# Patient Record
Sex: Male | Born: 1955 | Race: White | Hispanic: No | Smoking: Never smoker
Health system: Southern US, Community
[De-identification: ages and names within clinical notes are randomized; demographics above are authoritative.]

## PROBLEM LIST (undated history)

## (undated) DIAGNOSIS — Z8679 Personal history of other diseases of the circulatory system: Secondary | ICD-10-CM

## (undated) DIAGNOSIS — E785 Hyperlipidemia, unspecified: Secondary | ICD-10-CM

## (undated) DIAGNOSIS — E669 Obesity, unspecified: Secondary | ICD-10-CM

## (undated) DIAGNOSIS — M1990 Unspecified osteoarthritis, unspecified site: Secondary | ICD-10-CM

## (undated) DIAGNOSIS — I10 Essential (primary) hypertension: Secondary | ICD-10-CM

## (undated) DIAGNOSIS — I219 Acute myocardial infarction, unspecified: Secondary | ICD-10-CM

## (undated) DIAGNOSIS — I2510 Atherosclerotic heart disease of native coronary artery without angina pectoris: Secondary | ICD-10-CM

## (undated) DIAGNOSIS — E119 Type 2 diabetes mellitus without complications: Secondary | ICD-10-CM

## (undated) DIAGNOSIS — M109 Gout, unspecified: Secondary | ICD-10-CM

## (undated) DIAGNOSIS — K219 Gastro-esophageal reflux disease without esophagitis: Secondary | ICD-10-CM

## (undated) DIAGNOSIS — Z9861 Coronary angioplasty status: Secondary | ICD-10-CM

## (undated) HISTORY — DX: Type 2 diabetes mellitus without complications: E11.9

## (undated) HISTORY — PX: FOOT FRACTURE SURGERY: SHX645

## (undated) HISTORY — PX: TONSILLECTOMY: SUR1361

## (undated) HISTORY — PX: PILONIDAL CYST EXCISION: SHX744

---

## 1998-03-24 DIAGNOSIS — I2510 Atherosclerotic heart disease of native coronary artery without angina pectoris: Secondary | ICD-10-CM

## 1998-03-24 HISTORY — PX: CORONARY ANGIOPLASTY: SHX604

## 1998-03-24 HISTORY — DX: Atherosclerotic heart disease of native coronary artery without angina pectoris: I25.10

## 1998-11-05 ENCOUNTER — Inpatient Hospital Stay (HOSPITAL_COMMUNITY): Admission: EM | Admit: 1998-11-05 | Discharge: 1998-11-07 | Payer: Self-pay | Admitting: Cardiology

## 1999-05-16 ENCOUNTER — Ambulatory Visit (HOSPITAL_COMMUNITY): Admission: RE | Admit: 1999-05-16 | Discharge: 1999-05-17 | Payer: Self-pay | Admitting: *Deleted

## 2001-02-12 ENCOUNTER — Ambulatory Visit (HOSPITAL_COMMUNITY): Admission: RE | Admit: 2001-02-12 | Discharge: 2001-02-12 | Payer: Self-pay | Admitting: Cardiology

## 2001-02-12 ENCOUNTER — Encounter: Payer: Self-pay | Admitting: Cardiology

## 2002-11-16 ENCOUNTER — Ambulatory Visit (HOSPITAL_COMMUNITY): Admission: RE | Admit: 2002-11-16 | Discharge: 2002-11-16 | Payer: Self-pay | Admitting: Family Medicine

## 2002-11-16 ENCOUNTER — Encounter: Payer: Self-pay | Admitting: Family Medicine

## 2002-12-26 ENCOUNTER — Ambulatory Visit (HOSPITAL_COMMUNITY): Admission: RE | Admit: 2002-12-26 | Discharge: 2002-12-26 | Payer: Self-pay | Admitting: Internal Medicine

## 2003-07-05 ENCOUNTER — Ambulatory Visit (HOSPITAL_COMMUNITY): Admission: RE | Admit: 2003-07-05 | Discharge: 2003-07-05 | Payer: Self-pay | Admitting: *Deleted

## 2004-03-24 HISTORY — PX: CORONARY ARTERY BYPASS GRAFT: SHX141

## 2004-07-22 HISTORY — PX: OTHER SURGICAL HISTORY: SHX169

## 2004-07-30 ENCOUNTER — Inpatient Hospital Stay (HOSPITAL_COMMUNITY): Admission: EM | Admit: 2004-07-30 | Discharge: 2004-08-07 | Payer: Self-pay | Admitting: Emergency Medicine

## 2004-07-30 ENCOUNTER — Ambulatory Visit: Payer: Self-pay | Admitting: Cardiology

## 2004-08-22 ENCOUNTER — Encounter (HOSPITAL_COMMUNITY): Admission: RE | Admit: 2004-08-22 | Discharge: 2004-09-21 | Payer: Self-pay | Admitting: *Deleted

## 2004-08-23 ENCOUNTER — Ambulatory Visit: Payer: Self-pay | Admitting: *Deleted

## 2004-08-29 ENCOUNTER — Ambulatory Visit (HOSPITAL_COMMUNITY): Admission: RE | Admit: 2004-08-29 | Discharge: 2004-08-29 | Payer: Self-pay | Admitting: *Deleted

## 2004-09-30 ENCOUNTER — Encounter (HOSPITAL_COMMUNITY): Admission: RE | Admit: 2004-09-30 | Discharge: 2004-10-30 | Payer: Self-pay | Admitting: *Deleted

## 2004-10-22 ENCOUNTER — Ambulatory Visit (HOSPITAL_COMMUNITY): Admission: RE | Admit: 2004-10-22 | Discharge: 2004-10-22 | Payer: Self-pay | Admitting: General Surgery

## 2004-12-23 ENCOUNTER — Ambulatory Visit: Payer: Self-pay | Admitting: *Deleted

## 2005-07-09 ENCOUNTER — Emergency Department (HOSPITAL_COMMUNITY): Admission: EM | Admit: 2005-07-09 | Discharge: 2005-07-10 | Payer: Self-pay | Admitting: Emergency Medicine

## 2006-02-04 ENCOUNTER — Ambulatory Visit: Payer: Self-pay | Admitting: Cardiovascular Disease

## 2006-02-20 IMAGING — CR DG CHEST 1V PORT
1 series · 1 of 1 positions shown · non-contrast
Comparison: none

CLINICAL DATA: Acute coronary artery syndrome.  Coronary artery bypass grafts, postop. 
 PORTABLE CHEST - 130Z5 ? 08/02/04:
 An AP semierect portable film of the chest is made on 08/02/04 at 0970 hours is compared to a preoperative study of [DATE] at 3482 hours and shows the endotracheal tube to be approximately 3.5 cm above the carina.  Swan-Ganz catheter tip is in the right main pulmonary artery.  Nasogastric tube is in the stomach.  There are 3 large chest tubes in place.   There is bilateral basilar atelectasis but no definite pneumothorax.

[view not recorded]
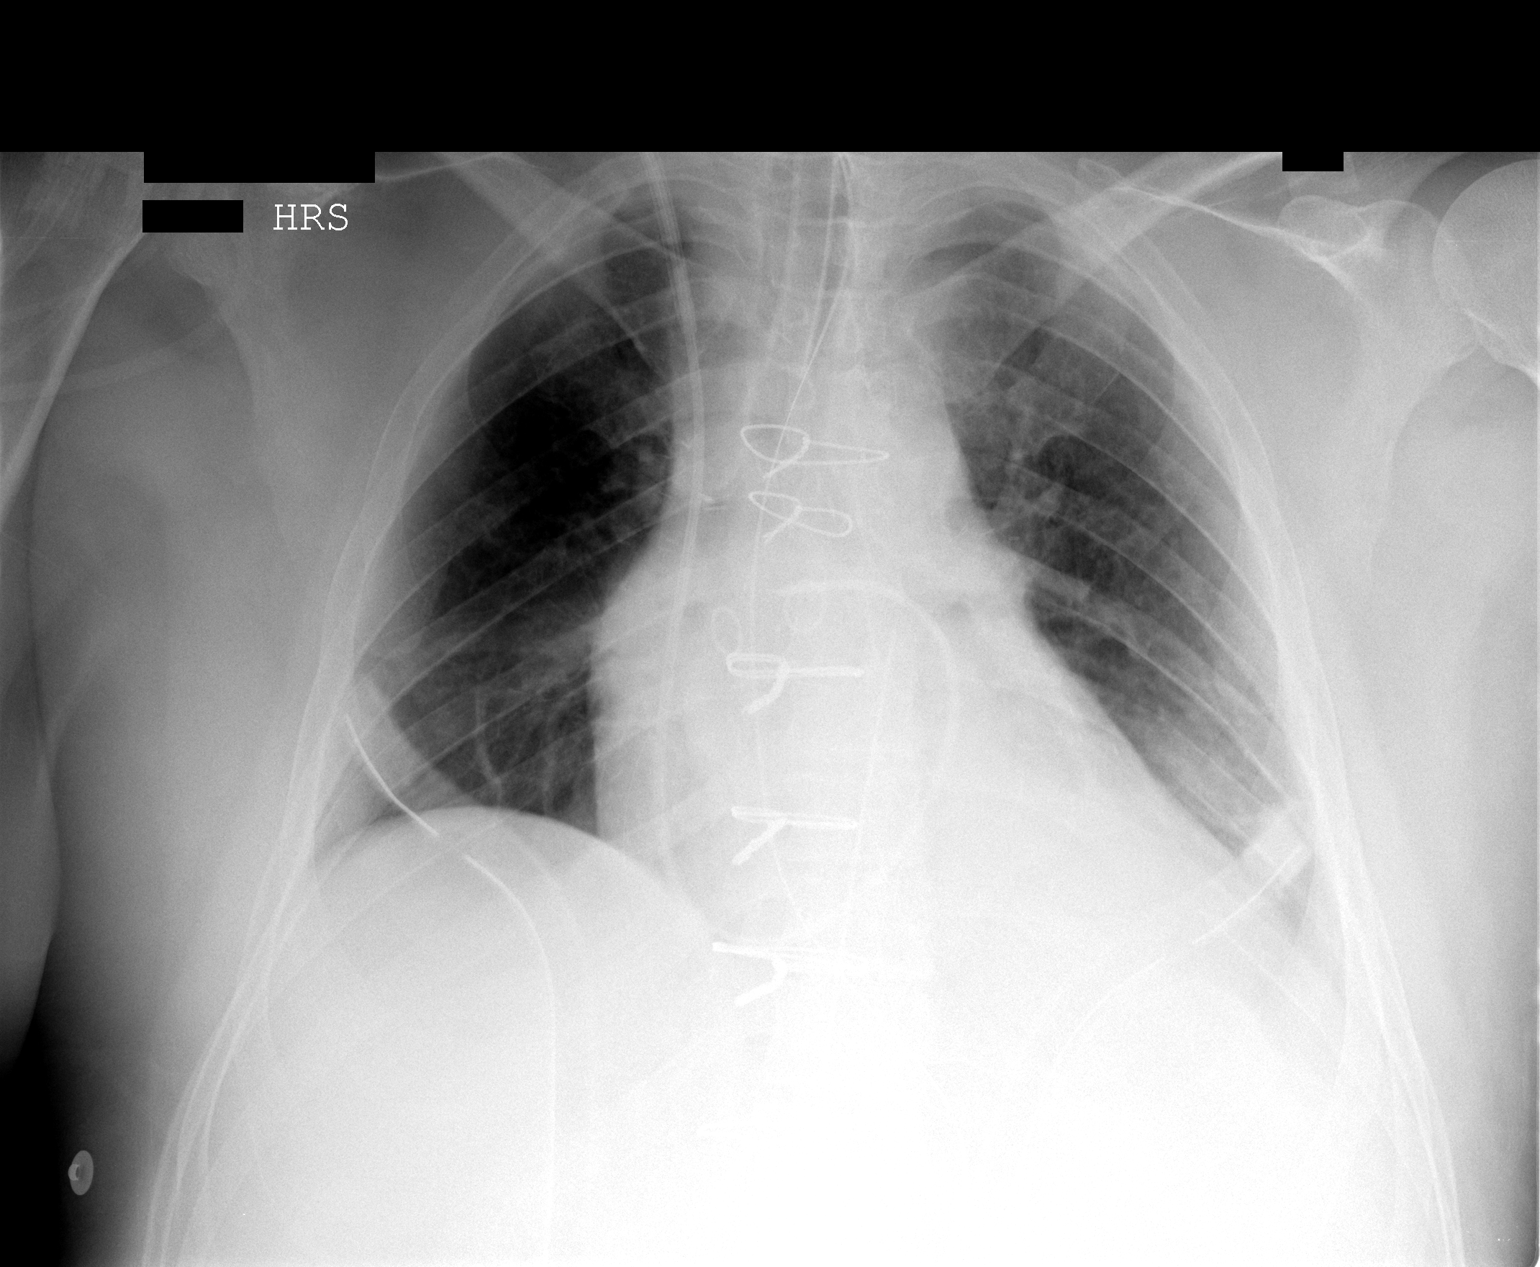

[1 of 1 positions shown; findings below may reference images not displayed]

IMPRESSION: Mild bilateral basilar atelectasis.  Good position of support tubes. No pneumothorax or edema.

## 2006-02-21 IMAGING — CR DG CHEST 1V PORT
1 series · 1 of 1 positions shown · non-contrast
Comparison: 08/02/04.

CLINICAL DATA: 48-year-old male; status post CABG 
 PORTABLE CHEST - 1 VIEW:

[view not recorded]
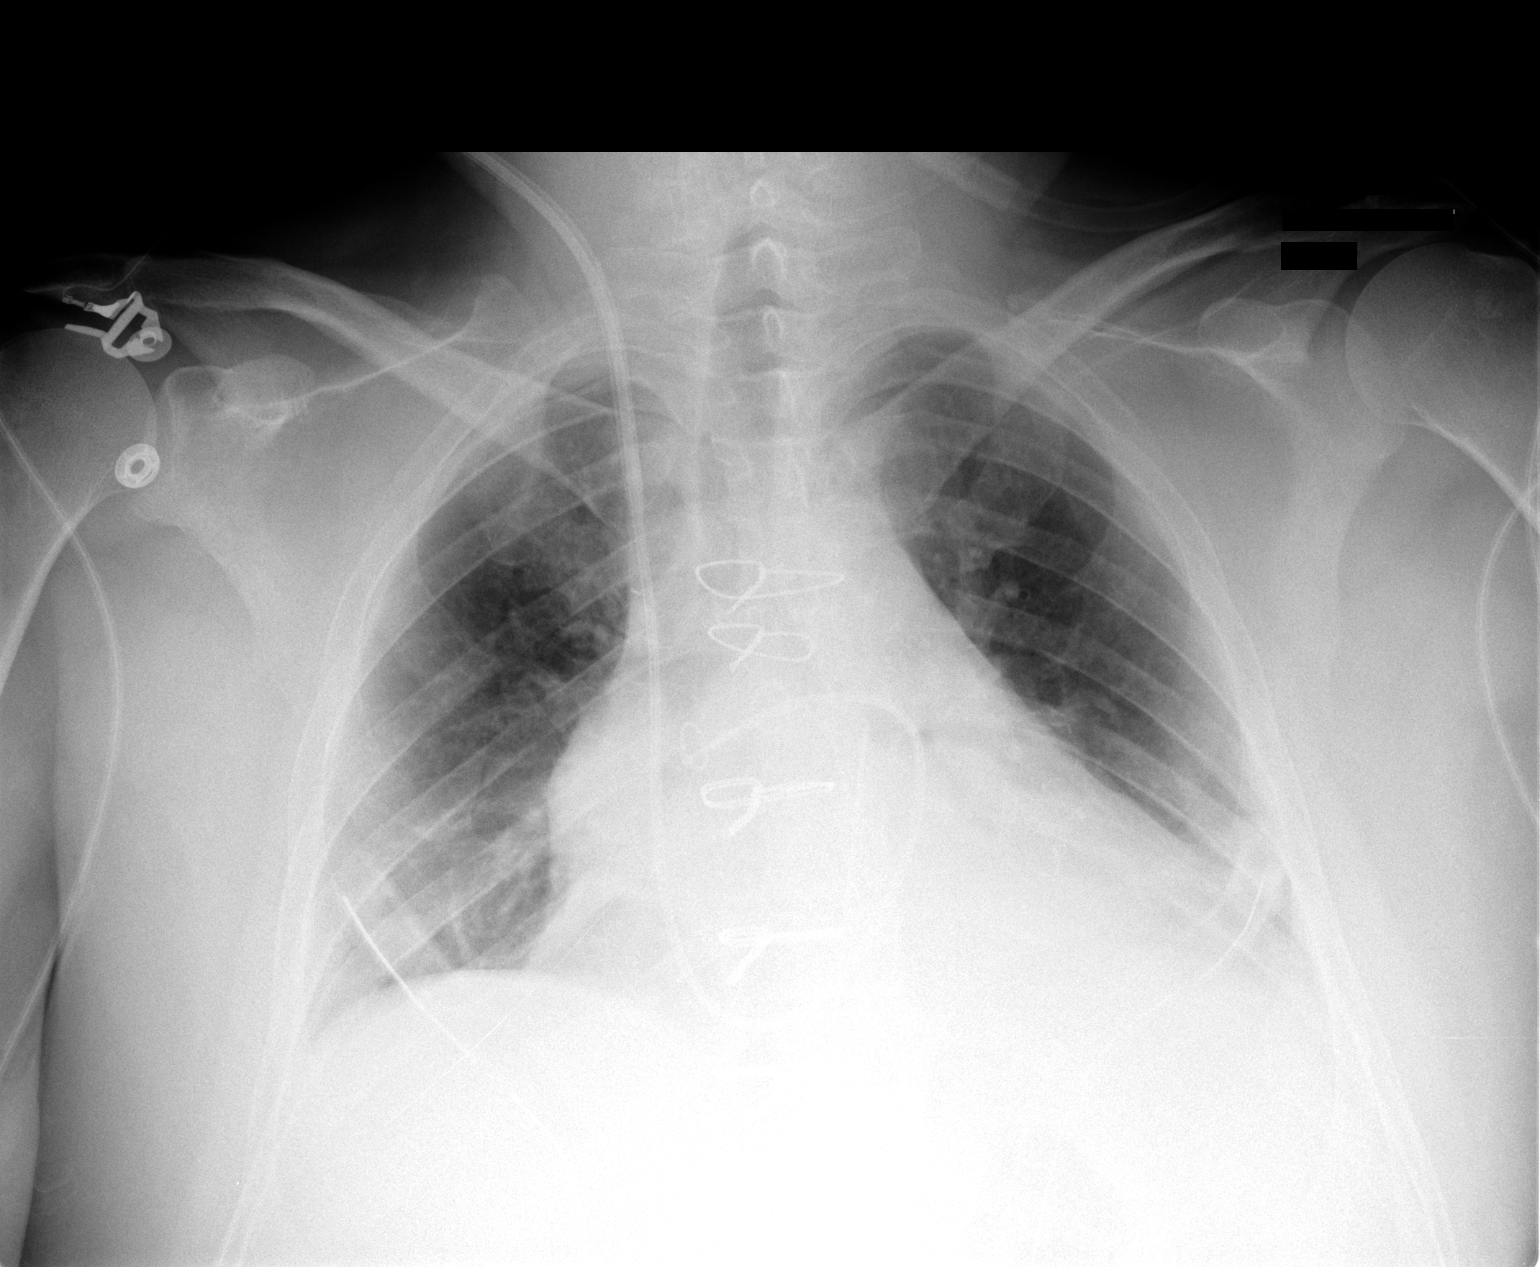

[1 of 1 positions shown; findings below may reference images not displayed]

The patient has been extubated.  Swan-Ganz catheter, mediastinal drains, and chest tubes remain.  Bibasilar atelectasis has increased.  No pneumothorax.
IMPRESSION: Extubated with increased atelectasis.

## 2007-08-05 ENCOUNTER — Ambulatory Visit: Payer: Self-pay | Admitting: Cardiovascular Disease

## 2007-08-22 ENCOUNTER — Emergency Department (HOSPITAL_COMMUNITY): Admission: EM | Admit: 2007-08-22 | Discharge: 2007-08-22 | Payer: Self-pay | Admitting: Emergency Medicine

## 2008-08-22 DIAGNOSIS — I10 Essential (primary) hypertension: Secondary | ICD-10-CM | POA: Insufficient documentation

## 2008-08-22 DIAGNOSIS — E785 Hyperlipidemia, unspecified: Secondary | ICD-10-CM | POA: Insufficient documentation

## 2008-08-23 ENCOUNTER — Ambulatory Visit: Payer: Self-pay | Admitting: Cardiovascular Disease

## 2008-09-15 ENCOUNTER — Encounter: Payer: Self-pay | Admitting: Cardiovascular Disease

## 2008-09-29 LAB — CONVERTED CEMR LAB
ALT: 21 units/L (ref 0–53)
AST: 23 units/L (ref 0–37)
LDL Cholesterol: 120 mg/dL — ABNORMAL HIGH (ref 0–99)
Total Bilirubin: 0.8 mg/dL (ref 0.3–1.2)

## 2009-04-09 ENCOUNTER — Telehealth (INDEPENDENT_AMBULATORY_CARE_PROVIDER_SITE_OTHER): Payer: Self-pay | Admitting: *Deleted

## 2009-07-09 ENCOUNTER — Encounter (INDEPENDENT_AMBULATORY_CARE_PROVIDER_SITE_OTHER): Payer: Self-pay | Admitting: *Deleted

## 2009-08-14 ENCOUNTER — Ambulatory Visit: Payer: Self-pay | Admitting: Cardiovascular Disease

## 2009-08-16 LAB — CONVERTED CEMR LAB
ALT: 30 units/L (ref 0–53)
AST: 21 units/L (ref 0–37)
Alkaline Phosphatase: 53 units/L (ref 39–117)
Bilirubin, Direct: 0.1 mg/dL (ref 0.0–0.3)
Cholesterol: 191 mg/dL (ref 0–200)
HDL: 52 mg/dL (ref >39.00)
Total Bilirubin: 0.8 mg/dL (ref 0.3–1.2)
Total CHOL/HDL Ratio: 4
Total Protein: 6.6 g/dL (ref 6.0–8.3)

## 2009-08-17 ENCOUNTER — Ambulatory Visit: Payer: Self-pay | Admitting: Cardiovascular Disease

## 2009-10-09 ENCOUNTER — Telehealth (INDEPENDENT_AMBULATORY_CARE_PROVIDER_SITE_OTHER): Payer: Self-pay | Admitting: *Deleted

## 2009-10-10 ENCOUNTER — Encounter: Payer: Self-pay | Admitting: Cardiology

## 2009-10-10 ENCOUNTER — Encounter (HOSPITAL_COMMUNITY): Admission: RE | Admit: 2009-10-10 | Discharge: 2009-12-11 | Payer: Self-pay | Admitting: Cardiovascular Disease

## 2009-10-10 ENCOUNTER — Ambulatory Visit: Payer: Self-pay | Admitting: Cardiology

## 2009-10-10 ENCOUNTER — Ambulatory Visit: Payer: Self-pay

## 2009-10-10 ENCOUNTER — Ambulatory Visit: Payer: Self-pay | Admitting: Cardiovascular Disease

## 2009-10-18 LAB — CONVERTED CEMR LAB
ALT: 22 units/L (ref 0–53)
AST: 21 units/L (ref 0–37)
Bilirubin, Direct: 0.2 mg/dL (ref 0.0–0.3)
Total Bilirubin: 1 mg/dL (ref 0.3–1.2)
Total Protein: 6.7 g/dL (ref 6.0–8.3)
Triglycerides: 129 mg/dL (ref 0.0–149.0)
VLDL: 25.8 mg/dL (ref 0.0–40.0)

## 2010-04-14 ENCOUNTER — Encounter: Payer: Self-pay | Admitting: *Deleted

## 2010-04-23 NOTE — Assessment & Plan Note (Signed)
Summary: Cardiology Nuclear Study  Nuclear Med Background Indications for Stress Test: Evaluation for Ischemia, Graft Patency, Stent Patency, PTCA Patency   History: Angioplasty, CABG, Echo, Heart Catheterization, Myocardial Infarction, Myocardial Perfusion Study, Stents  History Comments: 8/00 Lateral Wall MI > Stent: CFX '01 Angioplasty: RCA > Stent: RCA 10/01 Echo: EF=63% 11/02 MPS 5/06 AWMI > Heart Cath: EF= 65% > CABG x4  Symptoms: DOE, Palpitations    Nuclear Pre-Procedure Cardiac Risk Factors: Hypertension, Lipids Caffeine/Decaff Intake: None NPO After: 9:00 PM Lungs: clear IV 0.9% NS with Angio Cath: 20g     IV Site: (R) AC IV Started by: Irean Hong RN Chest Size (in) 48     Height (in): 69 Weight (lb): 243 BMI: 36.01  Nuclear Med Study 1 or 2 day study:  1 day     Stress Test Type:  Stress Reading MD:  Willa Rough, MD     Referring MD:  P.Nishan Resting Radionuclide:  Technetium 74m Tetrofosmin     Resting Radionuclide Dose:  11.0 mCi  Stress Radionuclide:  Technetium 23m Tetrofosmin     Stress Radionuclide Dose:  33.0 mCi   Stress Protocol Exercise Time (min):  10:00 min     Max HR:  155 bpm     Predicted Max HR:  166 bpm  Max Systolic BP: 196 mm Hg     Percent Max HR:  93.37 %     METS: 11.7 Rate Pressure Product:  16109    Stress Test Technologist:  Milana Na EMT-P     Nuclear Technologist:  Harlow Asa CNMT  Rest Procedure  Myocardial perfusion imaging was performed at rest 45 minutes following the intravenous administration of Myoview Technetium 68m Tetrofosmin.  Stress Procedure  The patient exercised for 10;00. The patient stopped due to fatigueand denied any chest pain.  There were no significant ST-T wave changes and occ pvcs/V.Bigemeny.  Myoview was injected at peak exercise and myocardial perfusion imaging was performed after a brief delay.  QPS Raw Data Images:  Normal; no motion artifact; normal heart/lung ratio. Stress Images:   There is normal uptake in all areas. Rest Images:  Normal homogeneous uptake in all areas of the myocardium. Subtraction (SDS):  No evidence of ischemia. Transient Ischemic Dilatation:  1.02  (Normal <1.22)  Lung/Heart Ratio:  .38  (Normal <0.45)  Quantitative Gated Spect Images QGS EDV:  112 ml QGS ESV:  42 ml QGS EF:  63 % QGS cine images:  Normal motion  Findings Normal nuclear study      Overall Impression  Exercise Capacity: Good exercise capacity. BP Response: Normal blood pressure response. Clinical Symptoms: No chest pain ECG Impression: No significant ST segment change suggestive of ischemia. Overall Impression: Normal stress nuclear study.  Appended Document: Cardiology Nuclear Study normal nuclear study  Appended Document: Cardiology Nuclear Study LMTCB./CY  Appended Document: Cardiology Nuclear Study pt aware./cy

## 2010-04-23 NOTE — Letter (Signed)
Summary: Appointment - Reminder 2  [facility], Main Office  [address]   [place], [place] [address]   Phone: [phone]  Fax: [phone]     July 09, 2009 MRN: [id number]   HARVEST STANCO [address] [place], [place]  [address]   Dear Mr. CRISCO,  Our records indicate that it is time to schedule a follow-up appointment with Dr. Eden Emms. It is very important that we reach you to schedule this appointment. We look forward to participating in your health care needs. Please contact us at the number listed above at your earliest convenience to schedule your appointment.  If you are unable to make an appointment at this time, give [place] a call so we can update our records.     Sincerely,   Migdalia Dk [facility] Scheduling Team

## 2010-04-23 NOTE — Progress Notes (Signed)
  Medications Added SIMVASTATIN 40 MG TABS (SIMVASTATIN) Take one tablet by mouth daily at bedtime       Phone Note Outgoing Call   Call placed by: Deliah Goody, RN,  April 09, 2009 9:33 AM Summary of Call: pt is currently taking vytorin and we received paperwork from the pharm requesting cheaper alternative. call placed to pt to discuss zocor and zetia in separate scripts. Left message to call back Deliah Goody, RN  April 09, 2009 9:35 AM   Follow-up for Phone Call        spoke with pt, he would like to try simvastatin 40mg  alone to see if that works for his numbers. he will have labs checked in 3 months Deliah Goody, RN  April 09, 2009 4:57 PM\par    New/Updated Medications: SIMVASTATIN 40 MG TABS (SIMVASTATIN) Take one tablet by mouth daily at bedtime Prescriptions: SIMVASTATIN 40 MG TABS (SIMVASTATIN) Take one tablet by mouth daily at bedtime  #30 x 12   Entered by:   Deliah Goody, RN   Authorized by:   Ferman Hamming, MD, [facility]   Signed by:   Deliah Goody, RN on 04/09/2009   Method used:   Electronically to        [phone] (retail)       [address]       [place], [place]  [address]       Ph: [phone] or [phone]       Fax: [phone]   RxID:   [id number]

## 2010-04-23 NOTE — Assessment & Plan Note (Signed)
Summary: F1Y/DM  Medications Added CRESTOR 20 MG TABS (ROSUVASTATIN CALCIUM) Take one tablet by mouth daily. CRESTOR 40 MG TABS (ROSUVASTATIN CALCIUM) Take one tablet by mouth daily.      Allergies Added: NKDA  History of Present Illness: [name] is seen  today for  followup  coronary artery disease.CABG  in 2006 by Dr. Orson Aloe.  This included a LIMA to the LAD a free RIMA to the acute marginal branch, vein graft to diagonal and vein graft to the RCA.  Hie has  normal LV  function.  He has been having some exertional dyspnea.  I suspect this is from his weight and deconditioning.  A lot of his symptoms pre CABG included dyspnea.  He has not had a ETT in 5 years.  His LDL was elevated at 120.  He had been on vytorin but his insurance wouldn't coveer it.  He is on Zocor and will need to be switched to crestor.  He has some tightness with his dsypnea.  Denies associated palpitations, diaphoresis or edema.  Compliant with meds  Current Problems (verified): 1)  Hyperlipidemia  (ICD-272.4) 2)  Hypertension  (ICD-401.9) 3)  Cad  (ICD-414.00)  Current Medications (verified): 1)  Aspirin 81 Mg Tbec (Aspirin) .... Take One Tablet By Mouth Daily 2)  Crestor 40 Mg Tabs (Rosuvastatin Calcium) .... Take One Tablet By Mouth Daily.  Allergies (verified): No Known Drug Allergies  Past History:  Past Medical History: Last updated: 08/22/2008 Current Problems:  HYPERLIPIDEMIA (ICD-272.4) HYPERTENSION (ICD-401.9) CAD (ICD-414.00) CABG 2006  Past Surgical History: Last updated: 08/22/2008  CABG 2006:   LIMA to the LAD, free RIMA to the OM, vein  graft to the diagonal, vein graft to the RCA.   Pilonidal cyst excision Tonsillectomy  Family History: Last updated: 08/22/2008 The family history is significant for cervical cancer in  the patient's mother who died at age 28 and lung cancer in the patient's  father who died at age 48.  On the patient's mother side of the family there  is a history  of cardiac disease.  The patient's reports two uncles with  bypass surgery at mid age.  Social History: Last updated: 08/23/2008 The patient works at [employer].  He denies any history of  tobacco or alcohol use.  He does not exercise regularly. Divorced x2 with 3 older children Working overtime at [employer] now.  Review of Systems       Denies fever, malais, weight loss, blurry vision, decreased visual acuity, cough, sputum, SOB, hemoptysis, pleuritic pain, palpitaitons, heartburn, abdominal pain, melena, lower extremity edema, claudication, or rash.   Vital Signs:  Patient profile:   55 year old male Height:      69 inches Weight:      250 pounds BMI:     37.05 Pulse rate:   72 / minute Resp:     16 per minute BP sitting:   152 / 87  (right arm)  Vitals Entered By: Marrion Coy, CNA (Aug 17, 2009 9:08 AM)  Physical Exam  General:  Affect appropriate Healthy:  appears stated age HEENT: normal Neck supple with no adenopathy JVP normal no bruits no thyromegaly Lungs clear with no wheezing and good diaphragmatic motion Heart:  S1/S2 no murmur,rub, gallop or click PMI normal Abdomen: benighn, BS positve, no tenderness, no AAA no bruit.  No HSM or HJR Distal pulses intact with no bruits No edema Neuro non-focal Skin warm and dry    Impression & Recommendations:  Problem #  1:  CAD (ICD-414.00) Dyspnea R/O anginal equivalent.  Stress myovue His updated medication list for this problem includes:    Aspirin 81 Mg Tbec (Aspirin) .Marland Kitchen... Take one tablet by mouth daily  Orders: Nuclear Stress Test (Nuc Stress Test)  Problem # 2:  HYPERTENSION (ICD-401.9) Well controlled His updated medication list for this problem includes:    Aspirin 81 Mg Tbec (Aspirin) .Marland Kitchen... Take one tablet by mouth daily  Problem # 3:  HYPERLIPIDEMIA (ICD-272.4) Target LDL 70 or less Change to Crestor His updated medication list for this problem includes:    Crestor 40 Mg Tabs (Rosuvastatin calcium)  .Marland Kitchen... Take one tablet by mouth daily.  Patient Instructions: 1)  Your physician recommends that you schedule a follow-up appointment in: ONEYEAR 2)  Your physician has requested that you have an exercise stress myoview.  For further information please visit [email or web address]  Please follow instruction sheet, as given. 3)  Your physician recommends that you return for lab work in: 2 MONTHS 4)  Your physician has recommended you make the following change in your medication: CRESTOR 40MG  ONCE DAILY Prescriptions: CRESTOR 40 MG TABS (ROSUVASTATIN CALCIUM) Take one tablet by mouth daily.  #30 x 12   Entered by:   Deliah Goody, RN   Authorized by:   Colon Branch, MD, [facility]   Signed by:   Deliah Goody, RN on 08/17/2009   Method used:   [facility]        [facility] [phone][facility] (retail)       [address]       [place], [place]  [address]       Ph: [phone] or [phone]       Fax: [phone]   RxID:   [id number] CRESTOR 20 MG TABS (ROSUVASTATIN CALCIUM) Take one tablet by mouth daily.  #30 x 12   Entered by:   Deliah Goody, RN   Authorized by:   Colon Branch, MD, [facility]   Signed by:   Deliah Goody, RN on 08/17/2009   Method used:   Electronically to        [facility] [phone]* (retail)       [address]       [place], [place]  [address]       Ph: [phone] or [phone]       Fax: [phone]   RxID:   [id number]    EKG Report  Procedure date:  08/17/2009  Findings:      NSR  Nonspecific ST/T wave changes

## 2010-04-23 NOTE — Progress Notes (Signed)
Summary: Nuclear Pre-Procedure  Phone Note Outgoing Call Call back at [facility] Phone [phone]   Call placed by: Stanton Kidney, EMT-P,  October 09, 2009 11:06 AM Action Taken: Phone Call Completed Summary of Call: Left message with information on Myoview Information Sheet (see scanned document for details).     Nuclear Med Background Indications for Stress Test: Evaluation for Ischemia, Graft Patency, Stent Patency, PTCA Patency   History: Angioplasty, CABG, Echo, Heart Catheterization, Myocardial Infarction, Myocardial Perfusion Study, Stents  History Comments: 8/00 Lateral Wall MI > Stent: CFX '01 Angioplasty: RCA > Stent: RCA 10/01 Echo: EF=63% 11/02 MPS 5/06 AWMI > Heart Cath: EF= 65% > CABG x4  Symptoms: DOE    Nuclear Pre-Procedure Cardiac Risk Factors: Hypertension, Lipids Height (in): 69

## 2010-05-13 ENCOUNTER — Other Ambulatory Visit (HOSPITAL_COMMUNITY): Payer: Self-pay | Admitting: Family Medicine

## 2010-05-13 DIAGNOSIS — N50819 Testicular pain, unspecified: Secondary | ICD-10-CM

## 2010-05-15 ENCOUNTER — Ambulatory Visit (HOSPITAL_COMMUNITY)
Admission: RE | Admit: 2010-05-15 | Discharge: 2010-05-15 | Disposition: A | Discharge status: Home / Self Care | Payer: 59 | Source: Ambulatory Visit | Attending: Family Medicine | Admitting: Family Medicine

## 2010-05-15 ENCOUNTER — Other Ambulatory Visit (HOSPITAL_COMMUNITY): Payer: Self-pay | Admitting: Family Medicine

## 2010-05-15 DIAGNOSIS — N50819 Testicular pain, unspecified: Secondary | ICD-10-CM

## 2010-05-15 DIAGNOSIS — IMO0002 Reserved for concepts with insufficient information to code with codable children: Secondary | ICD-10-CM

## 2010-05-15 DIAGNOSIS — N509 Disorder of male genital organs, unspecified: Secondary | ICD-10-CM | POA: Insufficient documentation

## 2010-05-15 DIAGNOSIS — N508 Other specified disorders of male genital organs: Secondary | ICD-10-CM | POA: Insufficient documentation

## 2010-08-06 NOTE — Assessment & Plan Note (Signed)
[name]                       [facility] CARDIOLOGY OFFICE NOTE   Nathan, [name]                     MRN:          [id number]  DATE:08/05/2007                            DOB:          [birth year]    [name] is seen today in followup.  I have not seen him before.  He  has been seen by Dr. Dorethea Clan.  He has a history of bypass surgery in  2006.  He had previously had an anterior wall MI in May of 2006.  He  subsequently had CABG with LIMA to the LAD, free RIMA to the OM, vein  graft to the diagonal, vein graft to the RCA.   He is a nondiabetic.  He is not having chest pain.  He does not need a  followup Myoview.   The patient unfortunately has not been taking his beta blocker and  cholesterol medicine.  His cholesterol in the past has not been that  bad, on therapy it has been 60-71.  He just got out of the habit of  taking his pills.  I explained to him that this would hasten his graft  failure.  He was on Vytorin 10/20 without a problem.  [facility] [place]  He said he would  take.  I told him to get his liver checked in 8-12 weeks with Dr.  Nobie Putnam.   Otherwise, the patient is doing well.  He continues to work at  [employer].  He walks but is otherwise sedentary.  He is not having chest  pain, PND, orthopnea.  There is no lower extremity edema.   He had previously been on beta blocker, but I do not think this is  necessary now.  He was cautioned to watch his blood pressure at home and  make sure the numbers stay below 150/90.   REVIEW OF SYSTEMS:  Otherwise negative.  He is looking forward to his  son's wedding in 2 weeks.   MEDICATIONS:  1. A baby aspirin.  2. Vytorin 10/20.   PHYSICAL EXAMINATION:  GENERAL:  An overweight white male in no  distress.  VITAL SIGNS:  Weight is 243, blood pressure is 130/80, pulse 79 and  regular, respiratory rate 14, afebrile.  HEENT:   Unremarkable.  NECK:  Carotids are without bruit.  No lymphadenopathy, thyromegaly, JVP  elevation.  LUNGS:  Clear with diaphragmatic motion.  No wheezing.  CARDIOVASCULAR:  S1-S2 with normal heart sounds.  PMI normal.  Status  post sternotomy.  ABDOMEN:  Protuberant.  Bowel sounds positive.  No AAA, no tenderness.  No hepatosplenomegaly or hepatojugular reflux.  No bruits.  EXTREMITIES:  Distal pulses intact.  No edema.  NEUROLOGIC:  Nonfocal.  SKIN:  Warm and dry.  MUSCULOSKELETAL:  No muscular weakness.   IMPRESSION:  1. Coronary disease, previous coronary artery bypass graft.  Continue      baby aspirin.  No need for beta blocker at this time.  2. Hypercholesterolemia in the setting of bypass graft.  Stopped      Vytorin 10/20.  3. Borderline hypertension.  Increase exercise program, decrease salt,      monitor blood pressure at home.  Follow up with Dr. Nobie Putnam.   We will see him back in a year.  The patient is asymptomatic.  His  bypass grafts are not that old, and he is nondiabetic, so I do not think  he needs a followup stress test.     Nathan Arista C. Eden Emms, MD, [facility]  Electronically Signed    PCN/MedQ  DD: 08/05/2007  DT: 08/05/2007  Job #: [id number]

## 2010-08-09 NOTE — Discharge Summary (Signed)
[name], [name] NO.:  [id number]   MEDICAL RECORD NO.:  [id number]          PATIENT TYPE:  INP   LOCATION:  2029                         FACILITY:  [facility]   PHYSICIAN:  Salvatore Decent. Dorris Fetch, M.D.DATE OF BIRTH:  14-Jan-1956   DATE OF ADMISSION:  07/30/2004  DATE OF DISCHARGE:                                 DISCHARGE SUMMARY   PRIMARY DIAGNOSIS:  Coronary artery disease.   IN-HOSPITAL DIAGNOSIS:  Acute blood loss anemia.   SECONDARY DIAGNOSES:  1.  Coronary artery disease, status post myocardial infarction in 2000 and      status post percutaneous transluminal coronary angiography of circumflex      previously, most recently in 2001.  2.  Hypertension.  3.  Hypercholesterolemia.   ALLERGIES:  No known drug allergies.   IN-HOSPITAL OPERATIONS AND PROCEDURES:  1.  Coronary artery bypass grafting x 4 with the left internal mammary      artery to the left anterior descending artery, a free right internal      mammary artery to first obtuse marginal, saphenous vein graft to first      diagonal and saphenous vein graft to distal right coronary artery.  Note      that endoscopic vein harvest from right thigh was done.  2.  Cardiac catheterization.  The left heart catheter, left      ventriculography, coronary angiography and __________ closure of the      right common femoral arteriotomy site.   HISTORY OF PRESENT ILLNESS:  Mr. Sivils is a 55 year old gentleman with a  history of coronary artery disease, status post PTCA and stenting x 2 and  previous MI back in 2000.  He had been well until about three weeks ago when  he developed exertional chest pain with moderate exertion.  It was  accompanied by shortness of breath.  No nausea, vomiting or radiation.  He  describes this as tightness or fullness in the chest.  He denied any rest or  nocturnal symptoms.  He initially delayed seeing treatment because he had a  doctor's appointment scheduled for next week,  but finally became concerned.  This did not resolve and he went to see his doctor, Dr. Phillips Odor.  He was  subsequently referred to Vida Roller, M.D., who recommended cardiac  catheterization.   HOSPITAL COURSE:  He had cardiac catheterization on Aug 01, 2004, which  revealed severe three-vessel coronary artery disease with preserved left  ventricular function.  He had an ejection fraction of about 65% without  regional wall motion abnormality.  The left anterior descending had a 50%  stenosis before the takeoff of the second diagonal and distal 90% stenosis.  The circumflex stent in the mid portion was widely patent, although there  was a 90% stenosis of the AV groove circumflex just before the takeoff of  the marginal.  RCA showed a 90% stenosis distally.  The balloon angioplasty  segment of the vessel was widely patent.  There was a question of mild  aortic stenosis on pullback of the catheter, but the valve appeared to move  well.  We  were consulted following catheterization.  The patient was seen  and evaluated by Dr. Dorris Fetch.  Dr. Dorris Fetch discussed with the  patient regarding coronary artery bypass grafting for these blockages.  He  discussed with the patient the risks and benefits of this procedure.  The  patient wished to proceed.  He was scheduled for surgery on Aug 02, 2004.  Preoperatively he underwent bilateral carotid duplex ultrasound which showed  no significant stenosis noted bilaterally.  He also underwent bilateral ABIs  which were within normal limits.   The patient was taken to the operating room on Aug 02, 2004, where he  underwent coronary artery bypass grafting x 4 with a left internal mammary  artery to the left anterior descending artery, a free right internal mammary  artery to first obtuse marginal, saphenous vein graft to the first diagonal  and saphenous vein graft to distal right coronary artery.  Note that  endoscopic vein harvesting from the right  thigh was used.  Also note that  targets were diffusely disease.  The patient tolerated this procedure well  and was transferred up to the intensive care unit in stable condition.  Postoperatively, the patient seems to be hemodynamically stable.  He was  extubated in the late evening, early morning following surgery.  On  postoperative day #1, the patient was seen to have an acute blood loss  anemia.  His H&H was 8.8 and 25.  He was in normal sinus rhythm.  Chest  tubes and lines were discontinued on postoperative day #1.  The patient was  transferred out to 2000 on postoperative day #1.  On postoperative day #2,  the patient seemed to be progressively well.  He was out of bed and  ambulating.  He remained on 2 L of O2, saturating 92%.  He remained in sinus  rhythm, apaced.  His H&H decreased to 7.9 and 22.3.  At the time, this was  monitored.  The patient was started on iron and folic acid.  He was seen to  be asymptomatic due to his low H&H.  Later on postoperative day #1, his  external pacer was deemed to be discontinued.  He remained in normal sinus  rhythm in the 80s.  On postoperative day #3, the patient continued to  progress well.  His H&H continued to drop to 7.1 and 20.2, although he  remained asymptomatic with blood pressure stable.  His heart remained in  normal sinus rhythm in the 80s.  He did remain on 2 L nasal cannula  saturating at 94%.  He was seen to be slight volume overloaded.  His  diuretics were continued.  Due to the patient being asymptomatic,  transfusion was held.  The pacing wires were discontinued on postoperative  day #3.   The patient was discharged home on postoperative day #4 in stable condition.  He was able to come off of the oxygen and saturating 91-92% on room air.  He  remained in normal sinus rhythm and hemodynamically stable.  A followup appointment was scheduled with Dr. Dorris Fetch for August 30, 2004, at 11:45  a.m.  The patient will see Dr. Samule Ohm  prior.  His office will scheduled an  appointment for him.  The patient will have a PA and lateral chest x-ray at  this appointment and will bring this x-ray with him to Dr. Sunday Corn  appointment.  Mr. Watrous received instructions on diet, activity level and  incisional care.  He was told no driving until released  to do so.  No heavy  lifting over 10 pounds.  He was told he was allowed to shower and wash his  incisions using soap and water.  He is to contact the office if he develops  any drainage or opening of any of his incision sites.  He was also informed  not to use any lotions or powders on his incisions.  The patient  acknowledged understanding.  He was educated on a low-fat, low-salt diet.  He was told to ambulate three to four times per day and to continue his  breathing exercises.  The patient again acknowledges understanding.   DISCHARGE MEDICATIONS:  1.  Aspirin 325 mg p.o. daily.  2.  Toprol XL 25 mg p.o. daily.  3.  Vytorin 10/20 mg p.o. q.h.s.  4.  Lasix 40 mg p.o. daily x 5 days.  5.  Potassium chloride 20 mEq p.o. daily x 5 days.  6.  Niferex 150 mg p.o. daily.  7.  Folic acid 1 mg p.o. daily.  8.  Tylox one to two tablets p.o. q.4-6h. p.r.n. pain.      KMD/MEDQ  D:  08/05/2004  T:  08/05/2004  Job:  [id number]

## 2010-08-09 NOTE — Cardiovascular Report (Signed)
NAMEDAK, SZUMSKI NO.:  [id number]   MEDICAL RECORD NO.:  [id number]          PATIENT TYPE:  INP   LOCATION:  3715                         FACILITY:  [facility]   PHYSICIAN:  Salvadore Farber, M.D. LHCDATE OF BIRTH:  1955-07-14   DATE OF PROCEDURE:  08/01/2004  DATE OF DISCHARGE:                              CARDIAC CATHETERIZATION   PROCEDURE:  1.  Left heart catheter.  2.  Left ventriculography.  3.  Coronary angiography.  4.  Starclose closure of the right common femoral arteriotomy site.   INDICATIONS:  Mr. Velaquez is a 56 year old gentleman with long-standing  coronary disease.  He is status post balloon angioplasty of the RCA in 2001  and stenting of the obtuse marginal in August 2000.  He now presents with  three weeks of unstable angina.  He is admitted to the hospital and ruled  out for myocardial infarction.  He was referred for diagnostic angiography.   PROCEDURAL TECHNIQUE:  Informed consent was obtained.  After 1% lidocaine  local anesthesia a 5-French sheath as placed within the right common femoral  artery.  Diagnostic angiography was then performed using JL4 catheter for  the circumflex, AL2 catheter for the LAD, JR4 for the right.  Left heart  catheterization and ventriculography was then performed using pigtail  catheter.  The arteriotomy site was then closed using a Starclose device.  Complete hemostasis was obtained.  He was transferred to the holding room in  stable condition.   COMPLICATIONS:  None.   FINDINGS:  1.  LV:  129/7/15.  EF approximately 65% without regional wall motion      abnormality.  2.  No mitral regurgitation.  3.  There is approximately 10 mmHg peak to peak aortic valvular gradient on      pullback.  However, the valve appears to move very well calling this      finding into question.  4.  Left main:  Short vessel which is angiographically normal.  5.  LAD:  Large vessel wrapping the apex of the heart and giving  rise to two      moderate sized diagonals.  The mid LAD has a 50% stenosis before the      takeoff of the second diagonal.  Just after the takeoff of this second      diagonal there is a long segment of disease with up to 90% stenosis.      The second diagonal has an ostial 40% lesion.  6.  Circumflex:  Moderate sized vessel giving rise to a single large obtuse      marginal.  The stent in the mid portion of this marginal was widely      patent.  However, there is a new 90% stenosis of the AV groove      circumflex just before the takeoff of the marginal.  There is a 30%      stenosis of the first obtuse marginal proximal to the stent.  7.  RCA:  Relatively small, though dominant vessel.  There is a 90% stenosis      distally.  The  balloon angioplasty site in the mid vessel is widely      patent.   IMPRESSION/RECOMMENDATION:  Patient has severe three vessel coronary disease  with preserved left ventricular systolic function.  Due to the length of the  left anterior descending lesion I think coronary artery bypass grafting  should be considered.  Will consult CVTS.      WED/MEDQ  D:  08/01/2004  T:  08/01/2004  Job:  [id number]   cc:   Vida Roller, M.D.  Fax: [phone]   Corrie Mckusick, M.D.  Fax: [phone]

## 2010-08-09 NOTE — Procedures (Signed)
[facility]  Patient:    [name], [name] Visit Number: [id number] MRN: [id number]          Service Type: OUT Location: RAD Attending Physician:  Cain Sieve Dictated by:   Delton See, P.A. Proc. Date: 02/12/01 Admit Date:  02/12/2001   CC:         Karleen Hampshire, M.D.   Stress Test  PROCEDURE:  Exercise Cardiolite study.  BRIEF HISTORY:  Ms. Gil is a pleasant 55 year old male with a history of coronary artery disease.  He had a PCI of the RCA performed May 16, 1999. There was residual mild-to-moderate disease.  He has a history of anxiety, history of a lateral wall MI in 2000, at which time he had a stent to the first obtuse marginal.  He had a 2-D echo in October of 2001 revealing an EF of 63%.  He had a Cardiolite in October of 2001 that was negative.  Prior to the study today, the patient reports no recent chest pain.  His baseline EKG shows sinus rhythm, rate 69, without ischemic changes.  Resting blood pressure was 140/80.  Pulse was 69.  Target heart rate was 147.  DESCRIPTION OF PROCEDURE:  The patient exercised for a total of 10 minutes and 57 seconds, reaching a maximum heart rate of 160 beats per minute.  He was injected at 7 minutes and 25 seconds into the study, at which time his heart rate was 146.  He did have an elevated blood pressure response to exercise with pressure of 200/100.  There were inferolateral ST changes at peak exercise.  This resolved quickly in recovery.  The patient had no symptoms of chest pain.  He did have bigeminy in recovery, although this was asymptomatic. The patients blood pressure was 150/80 at the time of conclusion of the study.  The final images are pending at time of this dictation. Dictated by:   Delton See, P.A. Attending Physician:  Cain Sieve DD:  02/12/01 TD:  02/13/01 Job: [id number]

## 2010-08-09 NOTE — Op Note (Signed)
NAMEAUDLEY, HINOJOS                        ACCOUNT NO.:  [id number]   MEDICAL RECORD NO.:  [id number]                   PATIENT TYPE:  AMB   LOCATION:  DAY                                  FACILITY:  [facility]   PHYSICIAN:  Lionel December, M.D.                 DATE OF BIRTH:  1956-01-07   DATE OF PROCEDURE:  12/26/2002  DATE OF DISCHARGE:                                 OPERATIVE REPORT   PROCEDURE:  Esophagogastroduodenoscopy.   HISTORY OF PRESENT ILLNESS:  Monte is a 55 year old Caucasian male who  recently presented with epigastric pain.  His symptoms have improved with  Aciphex and Carafate which has been discontinued.  He also has symptoms of  GERD for a couple of years.  We therefore recommended EGD.  At that time, he  was also complaining of dysphagia but he says this has completely resolved.  The procedure is reviewed with the patient.  Informed consent was obtained.   PREOPERATIVE MEDICATIONS:  Cetacaine spray for pharyngeal topical  anesthesia, Demerol 50 mg IV, Versed 6 mg IV in divided doses.   FINDINGS:  The procedure is performed in the endoscopy suite.  The patient's  vital signs and O2 saturations were monitored during the procedure and  remained stable.  The patient was placed in the left lateral decubitus  position and the Olympus video scope was passed through the oropharynx  without any difficulty into the esophagus.   Esophagus:  The mucosa of the esophagitis was normal except distally based  single erosion and two more erosions were noted at the GE junction.  The  squamocolumnar junction was wavy and there was a small sliding hiatal  hernia.   Stomach:  It was empty and distended very well on insufflation.  Folds of  the proximal stomach were normal.  Examination of the mucosa at the gastric  body, antrum, pyloric channel as well as angularis, fundus and cardia was  normal.   Duodenum:  Examination of the bulb and second part of the duodenum was also  normal.  The endoscope was withdrawn.  The patient tolerated the procedure  well.   FINAL DIAGNOSES:  1. Erosive reflux esophagitis with small sliding hiatal hernia.  2. Normal examination to the stomach, first and second part of the duodenum.   RECOMMENDATIONS:  He will continue antireflux measures and Aciphex at 20 mg  p.o. q.a.m.   He will return to our office on an as-needed basis.   Please note that I asked Jamaris once again to make an appointment to follow  up with Dr. Daleen Squibb regarding his cardiac disease.  He has not been seen in  over 1-1/2 years.       ___________________________________________  Lionel December, M.D.   NR/MEDQ  D:  12/26/2002  T:  12/26/2002  Job:  [id number]   cc:   Patrica Duel, M.D.  [address]  [place]  [place] [address]  Fax: [phone]   Jesse Sans. Wall, M.D.

## 2010-08-09 NOTE — H&P (Signed)
NAMEBRAIDON, [name] NO.:  [id number]   MEDICAL RECORD NO.:  [id number]          PATIENT TYPE:  INP   LOCATION:  1831                         FACILITY:  [facility]   PHYSICIAN:  Jonelle Sidle, M.D. LHCDATE OF BIRTH:  18-Jul-1955   DATE OF ADMISSION:  07/30/2004  DATE OF DISCHARGE:                                HISTORY & PHYSICAL   PRIMARY CARE PHYSICIAN:  Arbie Cookey, M.D.  -  medical.   CARDIOLOGIST:  Vida Roller, M.D.   REASON FOR ADMISSION:  Progressive chest pain concerning for unstable  angina.   HISTORY OF THE PRESENT ILLNESS:  Mr. Fandrich is a 55 year old male with a  known history of coronary artery disease status post previous stent  placement to an obtuse marginal branch back in August 2000, followed more  recently by angioplasty to the right coronary artery in February 2001.  He  presented to the emergency department this evening complaining of a three-  week history of progressive exertional chest pain.  He has noticed this at  work when he walks an eighth of a mile in from the parking lot up a mild  incline.  He has to stop due to chest pain and shortness of breath.  His  symptoms typically resolve within about five minutes.  He is not using any  sublingual nitroglycerin.  These symptoms have been moderate in intensity  and typically associated with shortness of breath.  He has noticed this more  frequently over the last few weeks; and, today, in fact, had a very severe  episode when he walked up two flights of steps, prompting his eval in the  emergency department.  He has had no specific rest symptoms and reports  compliance with his medications.   Most recent ischemic testing was via an exercise Cardiolite back in April  2005, which was negative for ischemia.  His electrocardiogram at this point  shows sinus rhythm at 87 beats/minute with nonspecific ST changes and  initial cardiac markers are normal.  He is not having any active chest  pain.   ALLERGIES:  No reported drug allergies,   MEDICATIONS:  The patient's medications include:  1.  Altace 10 mg p.o. daily.  2.  Enteric coated aspirin 325 mg p.o. daily.  3.  Lopressor 25 mg p.o. daily.  4.  Vytorin 10/20 mg p.o. daily.   PAST MEDICAL HISTORY:  1.  Coronary artery disease status post apparent out of hospital myocardial      infarction with initial stent placement to the obtuse marginal back in      August 2000.  His most recent intervention was an angioplasty to the      right coronary artery and in February 2001.  At that time the patient      was also noted to have a 60% left anterior descending stenosis.  The      circumflex system showed nonobstructive atherosclerosis.  Ejection      fraction was greater than 60%.  2.  No documented history of hypertension or diabetes mellitus.  3.  Hyperlipidemia on Statin therapy.  SOCIAL HISTORY:  The patient works at [employer].  He denies any history of  tobacco or alcohol use.  He does not exercise regularly.   FAMILY HISTORY:  The family history is significant for cervical cancer in  the patient's mother who died at age 107 and lung cancer in the patient's  father who died at age 88.  On the patient's mother side of the family there  is a history of cardiac disease.  The patient's reports two uncles with  bypass surgery at mid age.   REVIEW OF SYSTEMS:  Please see the history of present illness.  The patient  has had no fevers, chills, cough, hemoptysis, melena, hematochezia,  palpitations, or syncope.  Other systems are negative.   PHYSICAL EXAMINATION:  VITAL SIGNS:  Blood pressure 153/85, heart rate 81,  temperature 98.2 degrees, respirations 20, and oxygen saturation is 97% on  room air.  GENERAL APPEARANCE:  In general this is an obese male lying supine in no  acute distress, denying chest pain.  HEENT:  Conjunctivae are normal.  Oropharynx is clear.  NECK:  The neck is supple without elevated jugular venous  pressure and  without carotid bruits. No thyromegaly is noted.  LUNGS:  The lungs are clear to auscultation bilaterally without loud  breathing at rest.  HEART:  Cardiac exam reveals a regular rate and rhythm without loud murmur  or pericardial rub.  There is no S3 gallop.  ABDOMEN:  The abdomen is soft and nontender with no obvious hepatomegaly and  no bruits.  EXTREMITIES:  The extremities show no pitting edema.  Peripheral pulses are  2+.  SKIN:  No ulcerative changes are noted.  MUSCULOSKELETAL: No kyphosis is noted.  NEUROPSYCHIATRIC:  The patient is alert and oriented times three.   LABORATORY DATA:  INR is 1.0.  Initial troponin I less than 0.05 by point of  care markers.  The remaining lab work is pending as is chest x-ray.   IMPRESSION:  1.  Progressive exertional chest pain concerning for unstable angina over      the last three weeks.  Initial point of care cardiac markers are      negative and electrocardiogram is nonspecific.  No specific rest pain      has been reported.  2.  Known coronary artery disease status post previous interventions to the      obtuse marginal and right coronary artery as detailed above.  Exercise      Cardiolite in April of last year was negative for ischemia.  3.  Hyperlipidemia on Statin therapy.   PLAN:  1.  The patient will be admitted to telemetry.  2.  We will continue to cycle cardiac markers.  3.  Home medications will be continued with the addition of Lovenox.  4.  Follow up patient with the profiles to insure lipids are at goal.  5.  After reviewing the risks and benefits of diagnostic coronary      angiography with the patient he is in agreement      and this will be scheduled for tomorrow to clearly outline the coronary      anatomy and assess any progressive disease that may require      revascularization.  6.  Further plans are to follow.     SGM/MEDQ  D:  07/30/2004  T:  07/30/2004  Job:  [id number]   cc:   Arbie Cookey,  M.D.   Vida Roller, M.D.  Fax: [phone]

## 2010-08-09 NOTE — Procedures (Signed)
[facility]  Patient:    SIDDIQ, KALUZNY Visit Number: [id number] MRN: [id number]          Service Type: OUT Location: RAD Attending Physician:  Cain Sieve Dictated by:   Delton See, P.A. Admit Date:  02/12/2001                                Stress Test  NO DICTATION. Dictated by:   Delton See, P.A. Attending Physician:  Cain Sieve DD:  02/12/01 TD:  02/13/01 Job: [id number]

## 2010-08-09 NOTE — Op Note (Signed)
NAMETAHER, VANNOTE NO.:  [id number]   MEDICAL RECORD NO.:  [id number]          PATIENT TYPE:  INP   LOCATION:  2302                         FACILITY:  [facility]   PHYSICIAN:  Salvatore Decent. Dorris Fetch, M.D.DATE OF BIRTH:  1956-01-29   DATE OF PROCEDURE:  08/02/2004  DATE OF DISCHARGE:                                 OPERATIVE REPORT   PREOPERATIVE DIAGNOSIS:  Three-vessel coronary disease, with new-onset  angina.   POSTOPERATIVE DIAGNOSIS:  Three-vessel coronary disease, with new-onset  angina.   PROCEDURE:  Median sternotomy, extracorporeal circulation, coronary artery  bypass grafting x 4 (left internal mammary artery to left anterior  descending artery, free right inferior mammary artery to obtuse marginal-1,  saphenous vein graft to first diagonal, saphenous vein graft to distal right  coronary), endoscopic vein harvest from right thigh.   SURGEON:  Salvatore Decent. Dorris Fetch, M.D.   ASSISTANT:  Jerold Coombe, P.A.   ANESTHESIA:  General.   FINDINGS:  Severe, diffusely diseased coronaries.  Right coronary: Poor  target.  Remainder of targets:  Fair quality.  Vein:  Fair quality.  Mammary:  Good quality.   CLINICAL NOTE:  Mr. Jobe is a 55 year old gentleman.  He has a history of  coronary disease and has a previous MI with previous angioplasties.  He had  done well until about three weeks ago when he developed new-onset anginal  pain.  He subsequently had cardiac catheterization which revealed severe  three-vessel disease and was referred for coronary artery bypass grafting.  Indications, risks, benefits and alternatives were discussed in detail with  the patient, and he understood and accepted the risks, and agreed to  proceed.  Because of the patient's young age, arterial grafting is  preferred, bilateral mammaries were planned.  He is left-handed.  His right  Allen's test was markedly positive and the radial was not a suitable graft  for use.   OPERATIVE NOTE:  Mr. Zeringue was brought to the preoperative holding area on  Aug 02, 2004.  The art lines were placed to monitor arterial, central venous  and pulmonary arterial pressure.  EKG leads were placed for continuous  telemetry and intravenous antibiotics were administered.  He was taken to  the operating room and anesthetized and intubated.  A Foley catheter was  placed.  The chest, abdomen and legs were prepped and draped in the usual  fashion.   A median sternotomy was performed, and the left internal mammary artery was  harvested in standard fashion.  Simultaneously, an infusion was made in the  medial aspect of the right leg at the level of the knee.  The greater  saphenous vein was harvested from the thigh.  Additional segment was  harvested from the upper calf with the small bridged incisions.  The  saphenous vein was of fair quality.  The mammary artery was of good quality.  Five-thousand units of heparin was administered during the harvest of these  vessels.  The right internal mammary artery then was harvested, again, in  standard fashion.  The remainder of the full heparin dose was given prior to  dividing the distal end of the right mammary artery.  There was good flow  through this vessel.  It was of good quality as well.   The pericardium was opened.  The aorta was foreshortened.  There was  cardiomegaly.  The aorta was cannulated via concentric 2-0 Ethibond, non-  pledgeted purse-string sutures.  A dual-stage venous cannula was placed  using a purse-string suture in the right atrial appendage.  Cardiopulmonary  bypass was instituted, and the patient was cooled to 32 degrees Celsius.  The coronary arteries were inspected and anastomotic sites were chosen.  The  conduits were inspected and cut to length.  The right coronary was a poor  target, and was not suitable for use with the mammary.  The right mammary  was inspected to see if it would reach the obtuse  marginal via the  transverse sinus, and it did not do that easily.  Therefore, it was taken  down, divided proximally and the stump was suture ligated with a 0 silk  suture.  The right mammary was then prepared for use as a free-graft.   A pedicle was placed in the pericardium to protect the left phrenic nerve.  A temperature pole was placed in the myocardial septum and a cardioplegia  cannula was placed in the ascending aorta.   The aorta was cross-clamped.  The left ventricle was entered via the aortic  root vent.  Cardiac arrest then was achieved with the combination of cold  antegrade blood cardioplegia and topical iced saline.  After achieving a  complete diastolic arrest and adequate myocardial septal cooling, the  following distal anastomoses were performed.   First, a reversed saphenous vein graft was placed end-to-side to the distal  rheumatoid arthritis.  This was a 1.5-mm vessel, terminating into a 1-mm  posterior descending.  It was of poor quality.  The vein graft was of fair  quality.  The anastomosis was performed with a running 7-0 Prolene suture.  There was good flow through the graft.  Cardioplegia was administered, a  small leak from the heel was repaired with the 7-0 Prolene suture.   Next, the reverse saphenous vein graft was placed end-to-side to the first  diagonal branch to the LAD.  The diagonal was a 1.5-mm vessel.  It was the  best quality of the target vessels.  It did have some mild plaquing.  The  saphenous vein was fair quality and anastomosis was performed end-to-side  with a running 7-0 Prolene suture.  There was good flow through the graft.  Cardioplegia was administered.  There was good hemostasis at the  anastomosis.   Next, the distal end of the free right internal mammary artery was  spatulated.  The wound was anastomosed end-to-side to the dominant first  obtuse marginal branch of the left circumflex.  This was compromise by 90% stenosis.  The  vessel was diffusely diseased throughout its course.  There  was significant lateral and posterior plaquing.  Vessel was approximately  2.5 mm in diameter and only had a 1.5-mm lumen.  The right mammary was a  good quality vessel.  It was anastomosed end-to-side with a running 8-0  Prolene suture.  The anastomosis was probed proximally and distally to  ensure patency.  Cardioplegia then was administered down the aortic root.  There was good back-bleeding from the right mammary.   Next, the left internal mammary artery was brought through a window in the  pericardium.  The distal end was spatulated,  then was anastomosed end-to-  side to the LAD.  The LAD was a 1.5-mm fair quality vessel, again diffusely  diseased, with moderate plaquing.  The left mammary was a good quality  vessel, was anastomosed end-to-side with a running 8-0 Prolene suture.  At  the completion of the mammary, the LAD anastomosis, the bulldog clamps were  briefly removed and immediate rewarming was noted.  There was good  hemostasis.  The bulldog clamps were replaced.   Additional cardioplegia was administered.  The cardioplegic cannula then was  removed from the ascending aorta.  The proximal vein graft and proximal free  right mammary anastomoses were performed with 4.5-mm punch aortotomies.  We  used 6-0 Prolene sutures for the vein grafts and a 7-0 Prolene for the right  mammary.   At the completion of the final vein graft anastomosis, the patient was  placed in steep Trendelenburg position.  De-airing maneuvers were performed.  The bulldog clamp was again removed from the left mammary artery.  Immediate  perfusion and re-warming was once again noted.  After de-airing the aortic  root, the aortic cross clamp was removed.  The total cross-clamp time was 84  minutes.   All proximal and distal anastomoses were inspected for hemostasis, while the  patient was being rewarmed.  Epicardial pacing wires were placed on  the  right ventricle and right atrium.  When the patient reached a core  temperature of 37 degrees Celsius, low-dose dopamine infusion was initiated.  PDD pacing was initiated and the patient was weaned from cardiopulmonary  bypass without difficulty on the first attempt.   Total bypass time was 139 minutes.  The initial cardiac index was greater  than 2 liters per minute per meter squared.  The patient remained  hemodynamically stable throughout the post bypass period.   A test dose of protamine was administered and was well tolerated.  The  atrial and aortic cannulas were removed.  The remainder of the protamine was  administered without incident.  The chest was irrigated with 1 liter of warm  normal saline, given 1 g of vancomycin,  and hemostasis was achieved.  The  pericardium was reapproximated with interrupted 3-0 silk sutures, came  together easily without tension.  Bilateral pleural and single mediastinal chest tubes were placed through separate subcostal incisions.  The sternum  was closed with a combination of simple and double heavy-gauge stainless  steel wires.  The pectoralis fascia, subcutaneous tissue and skin were  closed in standard fashion.  The subcuticular closure was used for the skin,  both the chest and the leg.  Also, the sponge, needle and instrument counts  were correct at the end of the procedure.  The patient remained  hemodynamically stable and was taken from the operating room to the surgical  intensive care unit in critical but stable condition.      SCH/MEDQ  D:  08/02/2004  T:  08/02/2004  Job:  [id number]   cc:   Jonelle Sidle, M.D. [facility]

## 2010-08-09 NOTE — Cardiovascular Report (Signed)
[place]  Patient:    [name], [name]                     MRN: [id number] Proc. Date: 05/16/99 Adm. Date:  [id number] Disc. Date: [id number] Attending:  Veneda Melter CC:         Veneda Melter, M.D. [facility]             Thomas C. Wall, M.D. [facility]             Karleen Hampshire, M.D.                        Cardiac Catheterization  PROCEDURES PERFORMED: 1. Left heart catheterization. 2. Left ventriculogram. 3. PTCA of the right coronary artery.  DIAGNOSES: 1. Multivessel coronary artery disease. 2. Normal left ventricular systolic function.  HISTORY:  [name] is a 55 year old white male with a known history of coronary artery disease, undergoing PTCA and stenting of the obtuse marginal branch in August 2000 when he suffered an out-of-hospital myocardial infarction.  The patient had done well in the interim, with mild dyspnea on exertion.  He recently underwent a stress Cardiolite study, which showed ischemia in the inferior and posterior walls.  He also had a small reversible defect in the anterior wall.  He presents now for left heart catheterization.  TECHNIQUE:  After informed consent was obtained, the patient was brought to the  catheterization lab and both groins were sterilely prepped and draped. Lidocaine 1% was used to infiltrate the right groin.  A 6-French sheath was placed in the  right femoral artery using the modified Seldinger technique.  Six-French JL4 and JR4 catheters were then used to engage the left and right coronary arteries. Selective angiography was performed in various projections using manual injections of contrast.  A 6-French pigtail catheter was then advanced to the left ventricle. A left ventriculogram was performed using power injections of contrast.  INITIAL FINDINGS: 1. Left Main Trunk:  Extremely short and normal. 2. Left Anterior Descending:  This begins as a large-caliber vessel and tapers    significant  in the proximal segment, prior to providing a first diagonal branch.    The LAD appears to be narrowed by about 60%.  Prior to the takeoff of the    diagonal branch there is a long segment of disease of 40-60% after the diagonal    branch.  Mild irregularities are noted in the first diagonal branch. 3. Left Circumflex Artery:  This is a large-caliber vessel that provides two    marginal branches.  The first marginal branch has mild, diffuse disease, with a    focal narrowing of 30% in the mid section in the area of previous stenting. The    remainder of the left circumflex artery has mild irregularities. 4. Right Coronary Artery:  Dominant.  This is a medium-caliber vessel that provides    a posterior descending artery in its terminal segment, and a very small    posterior ventricular branch distally.  The right coronary artery has a long  segment of disease in the mid section, with focal narrowing of 70%. 5. Left Ventricle:  Normal end-diastolic and end-systolic dimensions.  Overall    left ventricular function is well preserved.  Ejection fraction of greater than    60%.  No mitral regurgitation.  HEMODYNAMICS: 1. Left ventricular pressure:  127/10. 2. Aortic pressure:  127/71. 3. Left ventricular end-diastolic pressure:  15.  SUMMARY:  These findings were reviewed and discussed with the patient.  We elected to proceed with percutaneous intervention to the right coronary artery.  PERCUTANEOUS INTERVENTION:  A 6-French sheath in the right femoral artery was exchanged over a wire for a 7-French sheath, and a 7-French JR4 guiding catheter was used to engage the right coronary artery.  Heparin and Integrilin were administered on a weight-adjusted basis.  A 0.014 inch Sport wire was then advanced into the distal RCA.  A 2.5 x 20 mm Quantam Ranger balloon was then introduced.  This was used to predilate the lesion with two inflations at 12 atm for 30 seconds being performed.   Repeat angiography showed reduction of the diffuse 70% narrowing to approximately 30%.  A 3.0 x 30 mm CrossSail balloon was introduced and inflated at 4 atm for 60 seconds, 6 atm for 60 seconds, and 8 atm for 69 seconds. Repeat angiography showed a significant improvement in the vessel lumen.  There was a small stable dissection cap in the mid section of the vessel, and there was no compromise of blood flow through the artery.  There was mild residual narrowings after the takeoff of the marginal branch.  Two additional inflations were then performed at 10 atm for 60 seconds, with repeat angiography showing an excellent result with only mild residual narrowing in the mid RCA, stability of the dissection and TIMI-3 flow through the vessel.  Final angiography was performed after the administration of intracoronary nitroglycerin, confirming persistence of benefit.  The guiding catheter was then removed.  The sheath was secured into position.  The patient was transferred to the Floor in stable condition.  FINAL RESULTS:  Successful PTCA of the mid right coronary artery, with reduction of 70% narrowing to less than 10%.  ASSESSMENT/PLAN:  [name] is a 55 year old gentleman with aggressive coronary disease.  Further attention will be paid to medical management of his heart disease.  Repeat Cardiolite study in six to nine months may be beneficial to determine if the patient has restenosis in the RCA territory, and to follow the  progression of the mild anterior wall ischemia. DD:  05/16/99 TD:  05/17/99 Job: [id number]

## 2010-08-09 NOTE — H&P (Signed)
NAMEJEMARI, [name]              ACCOUNT NO.:  [id number]   MEDICAL RECORD NO.:  [id number]          PATIENT TYPE:  AMB   LOCATION:                                FACILITY:  [facility]   PHYSICIAN:  Dalia Heading, M.D.  DATE OF BIRTH:  [birth year]   DATE OF ADMISSION:  DATE OF DISCHARGE:  LH                                HISTORY & PHYSICAL   CHIEF COMPLAINT:  Hematochezia.   HISTORY OF PRESENT ILLNESS:  The patient is a 55 year old white male who is  referred for endoscopic evaluation.  He needs a colonoscopy for  hematochezia. He has bee having intermittent rectal pain secondary to an  anal fissure for the past few months.  Suppositories have not been helpful.  This occurred after a coronary artery bypass grafting in May of 2006.  He  did not have a myocardial infarction at the time.  No abdominal pain, weight  loss, nausea, vomiting, diarrhea, constipation or melena had been noted.  He  never has had a colonoscopy.  There is no family history of colon carcinoma.  No history of hemorrhoidal disease.   PAST MEDICAL HISTORY:  As noted above.   PAST SURGICAL HISTORY:  Coronary artery bypass grafting surgery, pilonidal  cyst excision, tonsillectomy.   CURRENT MEDICATIONS:  1.  Baby aspirin.  2.  Vytorin.  3.  Toprol XL.   ALLERGIES:  No known drug allergies.   REVIEW OF SYSTEMS:  Noncontributory.   PHYSICAL EXAMINATION:  GENERAL APPEARANCE:  The patient is a well-developed,  well-nourished white male in no acute distress.  VITAL SIGNS:  He is afebrile and vital signs are stable.  LUNGS:  Clear to auscultation with equal breath sounds bilaterally.  HEART:  Examination reveals regular rate and rhythm without S3, S4, without  murmurs.  ABDOMEN:  Soft, nontender, nondistended.  No hepatosplenomegaly or masses  are noted.  Rectal examination was deferred to the procedure.   IMPRESSION:  Hematochezia.   PLAN:  The patient is scheduled for a colonoscopy on October 22, 2004.  The  risks and benefits of the procedure including bleeding and perforation were  fully explained to the patient who gave informed consent.       MAJ/MEDQ  D:  10/17/2004  T:  10/17/2004  Job:  [id number]   cc:   Corrie Mckusick, M.D.  Fax: [phone]

## 2010-08-09 NOTE — Consult Note (Signed)
NAMEMARCIO, HOQUE NO.:  [id number]   MEDICAL RECORD NO.:  [id number]          PATIENT TYPE:  INP   LOCATION:  3715                         FACILITY:  [facility]   PHYSICIAN:  Salvatore Decent. Dorris Fetch, M.D.DATE OF BIRTH:  06-Jan-1956   DATE OF CONSULTATION:  08/01/2004  DATE OF DISCHARGE:                                   CONSULTATION   REASON FOR CONSULTATION:  Three vessel coronary disease.   CHIEF COMPLAINT:  Chest pain.   HISTORY OF PRESENT ILLNESS:  Mr. Dills is a 55 year old gentleman with  history of coronary artery disease status post PTCA and stenting x2 and  previous MI back in 2000.  He had done well until about three weeks ago when  he developed exertional chest pain with moderate exertion.  It was  accompanied by shortness of breath.  No nausea, vomiting or radiation.  He  described this as a tightness or fullness in his chest.  He denied any rest  or nocturnal symptoms.  He initially delayed seeking treatment because he  had a doctor's appointment scheduled for next week, but finally became  concerned.  This did not resolve, and he went to see his doctor, Dr.  Phillips Odor.  He was subsequently referred to Dr. Vida Roller who recommended  cardiac catheterization.  He had cardiac catheterization today which  revealed severe three vessel coronary disease with preserved left  ventricular function.  There is a question of mild aortic stenosis on  pullback of the catheter, but the valve appeared to move well.   PAST MEDICAL HISTORY:  1.  Coronary artery disease status post myocardial infarction in 2000 status      post PTCA and stenting of his right and circumflex previously, most      recently in 2001.  2.  Hypertension.  3.  Hypercholesterolemia.  4.  Denies diabetes.   ALLERGIES:  No known drug allergies.   FAMILY HISTORY:  Positive on mother's side for early coronary disease.   SOCIAL HISTORY:  He lives alone.  His daughter is present during the  exam.  He does not smoke or drink.   REVIEW OF SYSTEMS:  Had been doing well up until this point.  However, all  systems are negative.   PHYSICAL EXAMINATION:  GENERAL APPEARANCE:  Overweight 55 year old white  male in no acute distress.  He is well-developed, well-nourished.  NEUROLOGICAL:  Alert and oriented x3.  Grossly intact.  HEENT:  Unremarkable.  NECK:  Supple.  No thyromegaly, adenopathy or bruits.  CARDIAC:  Regular rate and rhythm.  Normal S1, S2.  No murmurs, rubs or  gallops.  LUNGS:  Clear with equal breath sounds bilaterally.  ABDOMEN:  Soft and nontender.  EXTREMITIES:  Without cyanosis, clubbing or edema.  He had 2+ pulses and  radial dorsalis pedis bilaterally.  He has a markedly positive Allen's test  with no cross filling with radial compression.  SKIN:  Warm and dry.   LABORATORY DATA:  Cardiac enzymes negative.  PT 12.9, PTT 29, white count  7.6, hematocrit 40, platelets 248.  Sodium 137, potassium 4.1, chloride 104,  CO2 32, glucose 103, BUN and creatinine 13 and 1.0.  Urinalysis was  negative.  His cholesterol was 134, triglycerides 82, HDL 46, LDL 72.  His  EKG yesterday showed normal sinus rhythm and normal EKG.   IMPRESSION:  Mr. Isabell is a 55 year old gentleman with severe three vessel  coronary disease.  He presents with exertional symptoms which are new onset  from three weeks ago.  He has critical lesions in all three vessels and is  best treated with coronary artery bypass grafting.  I discussed in detail  with him the nature and extent of the procedure including incision to be  used, expected postoperative stay and expected overall recovery.  He  understands that this is a palliative and not a curative procedure.  He  understands that the risks include but are not limited to death, stroke, MI,  DVT, PE, bleeding, possible need for transfusions, infections as well as  __________ dysfunction including respiratory, renal, hepatic or GI   complications as well as early and late graft failure.  We will plan to use  bilateral mammary arteries due to his young age.  However, we cannot use a  radial artery due to his marked     __________ normal Allen's test.      SCH/MEDQ  D:  08/01/2004  T:  08/01/2004  Job:  [id number]   cc:   Salvadore Farber, M.D. [facility]  [address]  [address]  [place]  [place] [address]   Corrie Mckusick, M.D.  Fax: [phone]   Vida Roller, M.D.  Fax: [phone]

## 2010-12-18 LAB — STREP A DNA PROBE

## 2011-05-03 ENCOUNTER — Emergency Department (HOSPITAL_COMMUNITY): Payer: 59

## 2011-05-03 ENCOUNTER — Inpatient Hospital Stay (HOSPITAL_COMMUNITY)
Admission: EM | Admit: 2011-05-03 | Discharge: 2011-05-06 | DRG: 287 | Disposition: A | Discharge status: Home / Self Care | Payer: 59 | Source: Ambulatory Visit | Attending: Cardiovascular Disease | Admitting: Cardiovascular Disease

## 2011-05-03 ENCOUNTER — Other Ambulatory Visit: Payer: Self-pay

## 2011-05-03 ENCOUNTER — Encounter (HOSPITAL_COMMUNITY): Payer: Self-pay

## 2011-05-03 DIAGNOSIS — I10 Essential (primary) hypertension: Secondary | ICD-10-CM | POA: Diagnosis present

## 2011-05-03 DIAGNOSIS — Z951 Presence of aortocoronary bypass graft: Secondary | ICD-10-CM

## 2011-05-03 DIAGNOSIS — IMO0001 Reserved for inherently not codable concepts without codable children: Secondary | ICD-10-CM | POA: Diagnosis present

## 2011-05-03 DIAGNOSIS — I252 Old myocardial infarction: Secondary | ICD-10-CM

## 2011-05-03 DIAGNOSIS — D649 Anemia, unspecified: Secondary | ICD-10-CM | POA: Diagnosis present

## 2011-05-03 DIAGNOSIS — I2510 Atherosclerotic heart disease of native coronary artery without angina pectoris: Secondary | ICD-10-CM | POA: Diagnosis present

## 2011-05-03 DIAGNOSIS — I214 Non-ST elevation (NSTEMI) myocardial infarction: Secondary | ICD-10-CM | POA: Diagnosis not present

## 2011-05-03 DIAGNOSIS — E785 Hyperlipidemia, unspecified: Secondary | ICD-10-CM | POA: Diagnosis present

## 2011-05-03 DIAGNOSIS — E669 Obesity, unspecified: Secondary | ICD-10-CM | POA: Diagnosis present

## 2011-05-03 DIAGNOSIS — R0789 Other chest pain: Principal | ICD-10-CM | POA: Diagnosis present

## 2011-05-03 HISTORY — DX: Personal history of other diseases of the circulatory system: Z86.79

## 2011-05-03 HISTORY — DX: Obesity, unspecified: E66.9

## 2011-05-03 HISTORY — DX: Hyperlipidemia, unspecified: E78.5

## 2011-05-03 HISTORY — DX: Atherosclerotic heart disease of native coronary artery without angina pectoris: I25.10

## 2011-05-03 LAB — DIFFERENTIAL
Basophils Absolute: 0 10*3/uL (ref 0.0–0.1)
Eosinophils Relative: 1 % (ref 0–5)
Lymphocytes Relative: 29 % (ref 12–46)
Lymphs Abs: 2.4 10*3/uL (ref 0.7–4.0)
Monocytes Relative: 11 % (ref 3–12)
Neutrophils Relative %: 58 % (ref 43–77)

## 2011-05-03 LAB — COMPREHENSIVE METABOLIC PANEL
ALT: 24 U/L (ref 0–53)
AST: 19 U/L (ref 0–37)
Albumin: 4.3 g/dL (ref 3.5–5.2)
Alkaline Phosphatase: 74 U/L (ref 39–117)
BUN: 11 mg/dL (ref 6–23)
Calcium: 9.7 mg/dL (ref 8.4–10.5)
Chloride: 100 mEq/L (ref 96–112)
Creatinine, Ser: 0.96 mg/dL (ref 0.50–1.35)
Glucose, Bld: 109 mg/dL — ABNORMAL HIGH (ref 70–99)
Potassium: 3.6 mEq/L (ref 3.5–5.1)
Sodium: 137 mEq/L (ref 135–145)
Total Protein: 7.6 g/dL (ref 6.0–8.3)

## 2011-05-03 LAB — CBC
HCT: 41.5 % (ref 39.0–52.0)
MCH: 30.8 pg (ref 26.0–34.0)
Platelets: 233 10*3/uL (ref 150–400)

## 2011-05-03 MED ORDER — ONDANSETRON HCL 4 MG PO TABS: 4.0000 mg | ORAL_TABLET | Freq: Four times a day (QID) | ORAL | Status: DC | PRN

## 2011-05-03 MED ORDER — ASPIRIN 81 MG PO CHEW
324.0000 mg | CHEWABLE_TABLET | Freq: Once | ORAL | Status: AC
  Administered 2011-05-03: 324 mg via ORAL

## 2011-05-03 MED ORDER — ENOXAPARIN SODIUM 40 MG/0.4ML ~~LOC~~ SOLN
40.0000 mg | SUBCUTANEOUS | Status: DC
  Administered 2011-05-03: 40 mg via SUBCUTANEOUS
  Filled 2011-05-03: qty 0.4

## 2011-05-03 MED ORDER — ONDANSETRON HCL 4 MG/2ML IJ SOLN: 4.0000 mg | Freq: Four times a day (QID) | INTRAMUSCULAR | Status: DC | PRN

## 2011-05-03 MED ORDER — ASPIRIN EC 81 MG PO TBEC
81.0000 mg | DELAYED_RELEASE_TABLET | Freq: Every day | ORAL | Status: DC
  Administered 2011-05-04: 81 mg via ORAL
  Filled 2011-05-03 (×3): qty 1

## 2011-05-03 MED ORDER — POTASSIUM CHLORIDE IN NACL 20-0.9 MEQ/L-% IV SOLN
INTRAVENOUS | Status: DC
  Administered 2011-05-03: 19:00:00 via INTRAVENOUS
  Filled 2011-05-03 (×3): qty 1000

## 2011-05-03 MED ORDER — ASPIRIN 81 MG PO CHEW
CHEWABLE_TABLET | ORAL | Status: AC
  Filled 2011-05-03: qty 4

## 2011-05-03 MED ORDER — NITROGLYCERIN 2 % TD OINT
0.5000 [in_us] | TOPICAL_OINTMENT | Freq: Four times a day (QID) | TRANSDERMAL | Status: DC
  Administered 2011-05-03 – 2011-05-04 (×4): 0.5 [in_us] via TOPICAL
  Filled 2011-05-03 (×3): qty 1

## 2011-05-03 MED ORDER — ACETAMINOPHEN 325 MG PO TABS: 650.0000 mg | ORAL_TABLET | Freq: Four times a day (QID) | ORAL | Status: DC | PRN

## 2011-05-03 MED ORDER — ALUM & MAG HYDROXIDE-SIMETH 200-200-20 MG/5ML PO SUSP: 30.0000 mL | Freq: Four times a day (QID) | ORAL | Status: DC | PRN

## 2011-05-03 MED ORDER — MORPHINE SULFATE 2 MG/ML IJ SOLN: 2.0000 mg | INTRAMUSCULAR | Status: DC | PRN

## 2011-05-03 MED ORDER — ALBUTEROL SULFATE (5 MG/ML) 0.5% IN NEBU: 2.5000 mg | INHALATION_SOLUTION | RESPIRATORY_TRACT | Status: DC | PRN

## 2011-05-03 MED ORDER — OXYCODONE HCL 5 MG PO TABS: 5.0000 mg | ORAL_TABLET | ORAL | Status: DC | PRN

## 2011-05-03 MED ORDER — SIMVASTATIN 20 MG PO TABS
20.0000 mg | ORAL_TABLET | Freq: Every day | ORAL | Status: DC
  Administered 2011-05-03: 20 mg via ORAL
  Filled 2011-05-03 (×2): qty 1

## 2011-05-03 MED ORDER — DOCUSATE SODIUM 100 MG PO CAPS
100.0000 mg | ORAL_CAPSULE | Freq: Every day | ORAL | Status: DC
  Administered 2011-05-03 – 2011-05-06 (×4): 100 mg via ORAL
  Filled 2011-05-03 (×4): qty 1

## 2011-05-03 MED ORDER — GUAIFENESIN-DM 100-10 MG/5ML PO SYRP: 5.0000 mL | ORAL_SOLUTION | ORAL | Status: DC | PRN

## 2011-05-03 MED ORDER — ASPIRIN 325 MG PO TABS
ORAL_TABLET | ORAL | Status: AC
  Filled 2011-05-03: qty 1

## 2011-05-03 MED ORDER — NITROGLYCERIN 0.4 MG SL SUBL
0.4000 mg | SUBLINGUAL_TABLET | SUBLINGUAL | Status: DC | PRN
  Administered 2011-05-03: 0.4 mg via SUBLINGUAL
  Filled 2011-05-03: qty 25

## 2011-05-03 MED ORDER — ACETAMINOPHEN 650 MG RE SUPP: 650.0000 mg | Freq: Four times a day (QID) | RECTAL | Status: DC | PRN

## 2011-05-03 NOTE — ED Notes (Signed)
Right side cp that started apporx 40 min pta, h/o heart surgery.  Denies sob, +nausea

## 2011-05-03 NOTE — ED Provider Notes (Signed)
History     CSN: [id number]  Arrival date & time 05/03/11  1409   First MD Initiated Contact with Patient 05/03/11 1456      Chief Complaint  Patient presents with  . Chest Pain    (Consider location/radiation/quality/duration/timing/severity/associated sxs/prior treatment) Patient is a 56 y.o. male presenting with chest pain. The history is provided by the patient.  Chest Pain The chest pain began less than 1 hour ago. Chest pain occurs constantly. The chest pain is improving. The severity of the pain is moderate. The quality of the pain is described as tightness. The pain radiates to the left arm. Pertinent negatives for primary symptoms include no fever, no wheezing, no nausea and no vomiting. He tried nothing for the symptoms.  His past medical history is significant for CAD. Procedure history comments: CABG in 2006.  No problems since..     Past Medical History  Diagnosis Date  . Myocardial infarct, old     Past Surgical History  Procedure Date  . Cardiac surgery   . Pilonidal cyst excision   . Tonsillectomy     No family history on file.  History  Substance Use Topics  . Smoking status: Never Smoker   . Smokeless tobacco: Not on file  . Alcohol Use: No      Review of Systems  Constitutional: Negative for fever.  Respiratory: Negative for wheezing.   Cardiovascular: Positive for chest pain.  Gastrointestinal: Negative for nausea and vomiting.  All other systems reviewed and are negative.    Allergies  Review of patient's allergies indicates not on file.  Home Medications  No current outpatient prescriptions on file.  BP 162/89  Pulse 88  Temp(Src) 98 F (36.7 C) (Oral)  Resp 20  Ht 5\' 9"  (1.753 m)  Wt 220 lb (99.791 kg)  BMI 32.49 kg/m2  SpO2 99%  Physical Exam  Nursing note and vitals reviewed. Constitutional: He is oriented to person, place, and time. He appears well-developed and well-nourished.  HENT:  Head: Normocephalic and  atraumatic.  Neck: Normal range of motion. Neck supple.  Cardiovascular: Normal rate and regular rhythm.  Exam reveals no friction rub.   No murmur heard. Pulmonary/Chest: Effort normal. No respiratory distress. He has no wheezes.  Abdominal: Soft. Bowel sounds are normal. He exhibits no distension. There is no tenderness.  Musculoskeletal: Normal range of motion. He exhibits no edema.  Neurological: He is alert and oriented to person, place, and time.  Skin: Skin is warm and dry. He is not diaphoretic.    ED Course  Procedures (including critical care time)   Labs Reviewed  CBC  DIFFERENTIAL  COMPREHENSIVE METABOLIC PANEL  CARDIAC PANEL(CRET KIN+CKTOT+MB+TROPI)   No results found.   No diagnosis found.   Date: 05/03/2011  Rate: 86  Rhythm: normal sinus rhythm  QRS Axis: normal  Intervals: normal  ST/T Wave abnormalities: normal  Conduction Disutrbances:none  Narrative Interpretation:   Old EKG Reviewed: unchanged    MDM  Labs, ekg okay.  Will consult medicine for admission, rule out of mi.        Geoffery Lyons, MD 05/03/11 1806

## 2011-05-03 NOTE — ED Notes (Signed)
Prior to nitro pt's reports left sided chest pressure, rates at 5.  Nitro given.

## 2011-05-03 NOTE — H&P (Addendum)
KAYVAN HOEFLING MRN: [id number] DOB/AGE: Feb 19, 1956 56 y.o. Primary Care Physician: Karleen Hampshire, M.D. Admit date: 05/03/2011 Chief Complaint: Chest pain HPI: The patient is a 56 year old and with a past medical history significant for coronary artery disease, status post four-vessel bypass grafting in 2006, and hyperlipidemia, who presents to the emergency department today with a chief complaint of chest pain. The chest pain started at approximately 1:30 PM this afternoon. He was sitting down at the table working on a puzzle. All of a sudden, he felt a pressure in his substernal area. It radiated to the left chest and then down his left arm. He had numbness in his left arm for a few minutes. His symptoms were associated with transient nausea and diaphoresis. He denies vomiting, shortness of breath, and pleurisy. The pain was initially 7/10 in intensity. He has had no recent vomiting, abdominal pain, diarrhea, constipation, bright red blood per rectum, melena, or pain with urination. He has had no recent swelling in his legs. He did recover from her recent upper respiratory infection approximately one week ago.  In the emergency department, he is noted to be hemodynamically stable and afebrile. His blood pressure has been ranging from 115/70-162/89. He is oxygenating 99-100% on room air. His chest x-ray reveals no acute cardiopulmonary disease. His initial cardiac enzymes are within normal limits. His EKG reveals normal sinus rhythm with a heart rate of 86 beats per minute, possible posterior Q wave, but no ST or T wave abnormalities. He was given one sublingual nitroglycerin and 4 baby aspirin. He is now chest pain-free. He is being admitted for further evaluation and management.  Past Medical History  Diagnosis Date  . Myocardial infarct, old 2000    S/p Angioplasty 2001; s/p CABG x4 2006  . History of hypertension   . Hyperlipidemia   . Obesity   . Hydrocele 2012    Past Surgical History    Procedure Date  . Coronary artery bypass grafting May 2006    Four-vessel  . Pilonidal cyst excision   . Tonsillectomy     Prior to Admission medications   Medication Sig Start Date End Date Taking? Authorizing Provider  aspirin EC 81 MG tablet Take 81 mg by mouth daily.   Yes Historical Provider, MD    Allergies: No Known Allergies  Family history: His mother died of cervical cancer. His father died of complications of throat cancer.  Social History: He is divorced. He lives in [place]. He has 3 children in all. He works for [employer]. He denies tobacco, alcohol, and illicit drug use.     ROS: His review of systems is otherwise negative with exception of arthritic pain in his ankles and his knees.  PHYSICAL EXAM: Blood pressure 142/76, pulse 76, temperature 98 F (36.7 C), temperature source Oral, resp. rate 20, height 5\' 9"  (1.753 m), weight 99.791 kg (220 lb), SpO2 100.00%.  General: Pleasant alert 56 year old Caucasian man who is currently sitting up in bed in no acute distress. HEENT: Head is normocephalic and nontraumatic. Pupils are equal, round, and reactive to light. Extraocular movements are intact. Conjunctivae are clear. Sclerae are white. Tympanic membranes are clear bilaterally. Nasal mucosa is dry. Oropharynx reveals good dentition. Mucous membranes are mildly dry. No posterior exudates or erythema. Neck: Supple, obese, no thyromegaly, no JVD, no bruit. Lungs: Clear to auscultation bilaterally. Heart: S1, S2, with no murmurs rubs or gallops. Abdomen: Obese, positive bowel sounds, soft, nontender, nondistended. Extremities: Pedal pulses palpable bilaterally. No pedal edema and  no pretibial edema. Neurologic: He is alert and oriented x3. Cranial nerves II through XII are intact. Strength is 5 over 5 throughout. Sensation is intact.  Basic Metabolic Panel:  Basename 05/03/11 1525  NA 137  K 3.6  CL 100  CO2 26  GLUCOSE 109*  BUN 11  CREATININE 0.96   CALCIUM 9.7  MG --  PHOS --   Liver Function Tests:  Basename 05/03/11 1525  AST 19  ALT 24  ALKPHOS 74  BILITOT 0.5  PROT 7.6  ALBUMIN 4.3   No results found for this basename: LIPASE:2,AMYLASE:2 in the last 72 hours No results found for this basename: AMMONIA:2 in the last 72 hours CBC:  Basename 05/03/11 1525  WBC 8.1  NEUTROABS 4.7  HGB 14.7  HCT 41.5  MCV 86.8  PLT 233   Cardiac Enzymes:  Basename 05/03/11 1525  CKTOTAL 92  CKMB 2.4  CKMBINDEX --  TROPONINI <0.30   BNP: No results found for this basename: PROBNP:3 in the last 72 hours D-Dimer: No results found for this basename: DDIMER:2 in the last 72 hours CBG: No results found for this basename: GLUCAP:6 in the last 72 hours Hemoglobin A1C: No results found for this basename: HGBA1C in the last 72 hours Fasting Lipid Panel: No results found for this basename: CHOL,HDL,LDLCALC,TRIG,CHOLHDL,LDLDIRECT in the last 72 hours Thyroid Function Tests: No results found for this basename: TSH,T4TOTAL,FREET4,T3FREE,THYROIDAB in the last 72 hours Anemia Panel: No results found for this basename: VITAMINB12,FOLATE,FERRITIN,TIBC,IRON,RETICCTPCT in the last 72 hours Coagulation: No results found for this basename: LABPROT:2,INR:2 in the last 72 hours Urine Drug Screen: Drugs of Abuse  No results found for this basename: labopia, cocainscrnur, labbenz, amphetmu, thcu, labbarb    Alcohol Level: No results found for this basename: ETH:2 in the last 72 hours Urinalysis: No results found for this basename: COLORURINE:2,APPERANCEUR:2,LABSPEC:2,PHURINE:2,GLUCOSEU:2,HGBUR:2,BILIRUBINUR:2,KETONESUR:2,PROTEINUR:2,UROBILINOGEN:2,NITRITE:2,LEUKOCYTESUR:2 in the last 72 hours Misc. Labs:   EKG: Normal sinus rhythm with a heart rate of 86 beats per minute, posterior Q-wave, no ST or T wave abnormalities.  No results found for this or any previous visit (from the past 240 hour(s)).   Results for orders placed during the  hospital encounter of 05/03/11 (from the past 48 hour(s))  CBC     Status: Normal   Collection Time   05/03/11  3:25 PM      Component Value Range Comment   WBC 8.1  4.0 - 10.5 (K/uL)    RBC 4.78  4.22 - 5.81 (MIL/uL)    Hemoglobin 14.7  13.0 - 17.0 (g/dL)    HCT 40.9  81.1 - 91.4 (%)    MCV 86.8  78.0 - 100.0 (fL)    MCH 30.8  26.0 - 34.0 (pg)    MCHC 35.4  30.0 - 36.0 (g/dL)    RDW 78.2  95.6 - 21.3 (%)    Platelets 233  150 - 400 (K/uL)   DIFFERENTIAL     Status: Normal   Collection Time   05/03/11  3:25 PM      Component Value Range Comment   Neutrophils Relative 58  43 - 77 (%)    Neutro Abs 4.7  1.7 - 7.7 (K/uL)    Lymphocytes Relative 29  12 - 46 (%)    Lymphs Abs 2.4  0.7 - 4.0 (K/uL)    Monocytes Relative 11  3 - 12 (%)    Monocytes Absolute 0.9  0.1 - 1.0 (K/uL)    Eosinophils Relative 1  0 - 5 (%)  Eosinophils Absolute 0.1  0.0 - 0.7 (K/uL)    Basophils Relative 1  0 - 1 (%)    Basophils Absolute 0.0  0.0 - 0.1 (K/uL)   COMPREHENSIVE METABOLIC PANEL     Status: Abnormal   Collection Time   05/03/11  3:25 PM      Component Value Range Comment   Sodium 137  135 - 145 (mEq/L)    Potassium 3.6  3.5 - 5.1 (mEq/L)    Chloride 100  96 - 112 (mEq/L)    CO2 26  19 - 32 (mEq/L)    Glucose, Bld 109 (*) 70 - 99 (mg/dL)    BUN 11  6 - 23 (mg/dL)    Creatinine, Ser 1.61  0.50 - 1.35 (mg/dL)    Calcium 9.7  8.4 - 10.5 (mg/dL)    Total Protein 7.6  6.0 - 8.3 (g/dL)    Albumin 4.3  3.5 - 5.2 (g/dL)    AST 19  0 - 37 (U/L)    ALT 24  0 - 53 (U/L)    Alkaline Phosphatase 74  39 - 117 (U/L)    Total Bilirubin 0.5  0.3 - 1.2 (mg/dL)    GFR calc non Af Amer >90  >90 (mL/min)    GFR calc Af Amer >90  >90 (mL/min)   CARDIAC PANEL(CRET KIN+CKTOT+MB+TROPI)     Status: Normal   Collection Time   05/03/11  3:25 PM      Component Value Range Comment   Total CK 92  7 - 232 (U/L)    CK, MB 2.4  0.3 - 4.0 (ng/mL)    Troponin I <0.30  <0.30 (ng/mL)    Relative Index RELATIVE INDEX IS  INVALID  0.0 - 2.5      Dg Chest Portable 1 View  05/03/2011  *RADIOLOGY REPORT*  Clinical Data: Chest pain  PORTABLE CHEST - 1 VIEW  Comparison: 07/09/2005  Findings: Lungs are clear. No pleural effusion or pneumothorax.  Cardiomediastinal silhouette is within normal limits. Postsurgical changes related to prior CABG.  IMPRESSION: No evidence of acute cardiopulmonary disease.  Original Report Authenticated By: Charline Bills, M.D.    Impression:  Active Problems:  HYPERLIPIDEMIA  CAD  Chest pain  Elevated blood pressure   1. Substernal chest pain with associated features. This may be angina. However, his EKG is nonacute and his cardiac enzymes are normal. He did receive relief with one sublingual nitroglycerin. Of note, his cardiac stress test 1 year ago was normal.  2. Coronary artery disease with a history of myocardial infarction and four-vessel CABG. He is followed by cardiologist Dr. Charlton Haws. He has not seen him in over one year.  3. Hyperlipidemia. The patient admittedly has been noncompliant because he did not go back to his cardiologist to get  a new prescription.  4. History of hypertension. The patient was apparently taken off his antihypertensive medication following his heart surgery. He says that his blood pressure had been trending upward when he checks it at the [facility]. His blood pressure will be monitored over the next 24 hours. If he continues to have chest discomfort and/or if his blood pressure remains elevated, we will add a beta blocker. Of note, his blood pressure was within normal limits on arrival to the ED.   Plan:  1. As above, the patient received 4 baby aspirin and one sublingual nitroglycerin in the emergency department. We'll continue treatment with daily aspirin, nitroglycerin paste, and prophylactic Lovenox. We'll add  a beta blocker if his blood pressure remains elevated. Will restart statin therapy empirically with simvastatin. We'll add  when necessary morphine for pain. We'll add Zofran as needed for nausea.  For further evaluation, we'll order cardiac enzymes every 8 hours x3, TSH, hemoglobin A1c, fasting lipid panel, and a followup EKG in the morning.  If patient continues to have pain on management above, will change Lovenox to full dosing and consider transfer to [facility] for definitive diagnosis.    Isabellamarie Randa 05/03/2011, 5:43 PM

## 2011-05-03 NOTE — ED Notes (Signed)
Pt undressed and placed into a hospital gown. EKG was completed and pt was placed on the heart monitor. NAD noted at this time.

## 2011-05-04 ENCOUNTER — Encounter (HOSPITAL_COMMUNITY): Payer: Self-pay | Admitting: *Deleted

## 2011-05-04 DIAGNOSIS — I214 Non-ST elevation (NSTEMI) myocardial infarction: Secondary | ICD-10-CM

## 2011-05-04 LAB — LIPID PANEL
Cholesterol: 210 mg/dL — ABNORMAL HIGH (ref 0–200)
Total CHOL/HDL Ratio: 4.1 RATIO
Triglycerides: 125 mg/dL (ref <150)
VLDL: 25 mg/dL (ref 0–40)

## 2011-05-04 LAB — CBC
Hemoglobin: 13.5 g/dL (ref 13.0–17.0)
MCV: 87.5 fL (ref 78.0–100.0)
Platelets: 214 10*3/uL (ref 150–400)
RBC: 4.49 MIL/uL (ref 4.22–5.81)
WBC: 7.9 10*3/uL (ref 4.0–10.5)

## 2011-05-04 LAB — CARDIAC PANEL(CRET KIN+CKTOT+MB+TROPI): Relative Index: 4.7 — ABNORMAL HIGH (ref 0.0–2.5)

## 2011-05-04 LAB — PROTIME-INR: Prothrombin Time: 13.6 seconds (ref 11.6–15.2)

## 2011-05-04 LAB — BASIC METABOLIC PANEL
CO2: 26 mEq/L (ref 19–32)
Chloride: 104 mEq/L (ref 96–112)
Creatinine, Ser: 0.87 mg/dL (ref 0.50–1.35)

## 2011-05-04 LAB — TSH: TSH: 1.478 u[IU]/mL (ref 0.350–4.500)

## 2011-05-04 MED ORDER — METOPROLOL TARTRATE 12.5 MG HALF TABLET
12.5000 mg | ORAL_TABLET | Freq: Two times a day (BID) | ORAL | Status: DC
  Administered 2011-05-04 – 2011-05-06 (×5): 12.5 mg via ORAL
  Filled 2011-05-04 (×6): qty 1

## 2011-05-04 MED ORDER — ASPIRIN EC 81 MG PO TBEC: 81.0000 mg | DELAYED_RELEASE_TABLET | Freq: Every day | ORAL | Status: DC

## 2011-05-04 MED ORDER — SODIUM CHLORIDE 0.9 % IV SOLN: 250.0000 mL | INTRAVENOUS | Status: DC | PRN

## 2011-05-04 MED ORDER — SODIUM CHLORIDE 0.9 % IJ SOLN
3.0000 mL | Freq: Two times a day (BID) | INTRAMUSCULAR | Status: DC
  Administered 2011-05-04 – 2011-05-05 (×2): 3 mL via INTRAVENOUS

## 2011-05-04 MED ORDER — ONDANSETRON HCL 4 MG/2ML IJ SOLN: 4.0000 mg | Freq: Four times a day (QID) | INTRAMUSCULAR | Status: DC | PRN

## 2011-05-04 MED ORDER — ROSUVASTATIN CALCIUM 40 MG PO TABS
40.0000 mg | ORAL_TABLET | Freq: Every day | ORAL | Status: DC
  Administered 2011-05-04 – 2011-05-05 (×2): 40 mg via ORAL
  Filled 2011-05-04 (×3): qty 1

## 2011-05-04 MED ORDER — SODIUM CHLORIDE 0.9 % IJ SOLN: 3.0000 mL | INTRAMUSCULAR | Status: DC | PRN

## 2011-05-04 MED ORDER — ASPIRIN 81 MG PO CHEW
324.0000 mg | CHEWABLE_TABLET | ORAL | Status: AC
  Administered 2011-05-05: 324 mg via ORAL
  Filled 2011-05-04: qty 4

## 2011-05-04 MED ORDER — SODIUM CHLORIDE 0.9 % IJ SOLN: 3.0000 mL | Freq: Two times a day (BID) | INTRAMUSCULAR | Status: DC

## 2011-05-04 MED ORDER — SODIUM CHLORIDE 0.9 % IV SOLN
INTRAVENOUS | Status: DC
  Administered 2011-05-05: 04:00:00 via INTRAVENOUS

## 2011-05-04 MED ORDER — ASPIRIN 81 MG PO CHEW: 324.0000 mg | CHEWABLE_TABLET | ORAL | Status: DC

## 2011-05-04 MED ORDER — NITROGLYCERIN 0.4 MG SL SUBL: 0.4000 mg | SUBLINGUAL_TABLET | SUBLINGUAL | Status: DC | PRN

## 2011-05-04 MED ORDER — ENOXAPARIN SODIUM 120 MG/0.8ML ~~LOC~~ SOLN
1.0000 mg/kg | Freq: Two times a day (BID) | SUBCUTANEOUS | Status: DC
  Administered 2011-05-04 (×2): 110 mg via SUBCUTANEOUS
  Filled 2011-05-04 (×6): qty 0.8

## 2011-05-04 MED ORDER — ACETAMINOPHEN 325 MG PO TABS: 650.0000 mg | ORAL_TABLET | ORAL | Status: DC | PRN

## 2011-05-04 MED ORDER — ASPIRIN 300 MG RE SUPP: 300.0000 mg | RECTAL | Status: DC

## 2011-05-04 NOTE — Progress Notes (Signed)
ANTICOAGULATION CONSULT NOTE -  Pharmacy Consult for Enoxaparin Indication: chest pain/ACS  No Known Allergies  Patient Measurements: Height: 5\' 9"  (175.3 cm) Weight: 240 lb 4.8 oz (109 kg) IBW/kg (Calculated) : 70.7  Enoxaparin Dosing Weight: 109  Vital Signs: Temp: 97.4 F (36.3 C) (02/10 0523) Temp src: Oral (02/10 0523) BP: 136/75 mmHg (02/10 0523) Pulse Rate: 69  (02/10 0523)  Labs:  Basename 05/04/11 0645 05/03/11 2343 05/03/11 1525  HGB 13.5 -- 14.7  HCT 39.3 -- 41.5  PLT 214 -- 233  APTT -- -- --  LABPROT -- -- --  INR -- -- --  HEPARINUNFRC -- -- --  CREATININE 0.87 -- 0.96  CKTOTAL 102 102 92  CKMB 5.5* 4.8* 2.4  TROPONINI 0.79* 0.71* <0.30   Estimated Creatinine Clearance: 115.3 ml/min (by C-G formula based on Cr of 0.87).  Medical History: Past Medical History  Diagnosis Date  . Myocardial infarct, old 2000    S/p Angioplasty 2001; s/p CABG x4 2006  . History of hypertension   . Hyperlipidemia   . Obesity   . Hydrocele 2012    Medications:  Scheduled:    . aspirin  324 mg Oral Once  . aspirin EC  81 mg Oral Daily  . docusate sodium  100 mg Oral Daily  . enoxaparin (LOVENOX) injection  1 mg/kg Subcutaneous Q12H  . metoprolol tartrate  12.5 mg Oral BID  . nitroGLYCERIN  0.5 inch Topical Q6H  . simvastatin  20 mg Oral q1800  . DISCONTD: enoxaparin  40 mg Subcutaneous Q24H    Assessment:  Full anticoagulation for ACS. Normal renal function parameters.  Goal of Therapy:   Provide full anticoagulation. Monitor for signs of complications.   Plan:  Enoxaparin 1 mg per kg every 12 hours. CBC daily.  Gilman Buttner, [place] J 05/04/2011,9:11 AM

## 2011-05-04 NOTE — Progress Notes (Signed)
Subjective: The patient has no complaints of chest pain. He denies shortness of breath.  Objective: Vital signs in last 24 hours: Filed Vitals:   05/03/11 1849 05/03/11 2045 05/03/11 2300 05/04/11 0523  BP: 157/92 145/83  136/75  Pulse: 82 72 76 69  Temp: 98 F (36.7 C) 97 F (36.1 C)  97.4 F (36.3 C)  TempSrc: Oral Oral  Oral  Resp: 18 18 20 20   Height: 5\' 9"  (1.753 m)     Weight: 109 kg (240 lb 4.8 oz)     SpO2: 95% 93% 96% 95%    Intake/Output Summary (Last 24 hours) at 05/04/11 0912 Last data filed at 05/04/11 0500  Gross per 24 hour  Intake    480 ml  Output      0 ml  Net    480 ml    Weight change:   Physical exam: General: Pleasant obese 56 year old man who is currently sitting up in bed, in no acute distress. Lungs: Clear to auscultation bilaterally. Heart: S1, S2, with no murmurs rubs or gallops. Abdomen: Obese, positive bowel sounds, soft, nontender, nondistended. Extremities: Pedal pulses palpable bilaterally. No pedal edema. Neurologic: He is alert and oriented x3. Cranial nerves II through XII are intact.  Lab Results: Basic Metabolic Panel:  Basename 05/04/11 0645 05/03/11 1525  NA 139 137  K 4.0 3.6  CL 104 100  CO2 26 26  GLUCOSE 106* 109*  BUN 9 11  CREATININE 0.87 0.96  CALCIUM 9.0 9.7  MG -- --  PHOS -- --   Liver Function Tests:  Basename 05/03/11 1525  AST 19  ALT 24  ALKPHOS 74  BILITOT 0.5  PROT 7.6  ALBUMIN 4.3    Basename 05/03/11 1525  LIPASE 25  AMYLASE --   No results found for this basename: AMMONIA:2 in the last 72 hours CBC:  Basename 05/04/11 0645 05/03/11 1525  WBC 7.9 8.1  NEUTROABS -- 4.7  HGB 13.5 14.7  HCT 39.3 41.5  MCV 87.5 86.8  PLT 214 233   Cardiac Enzymes:  Basename 05/04/11 0645 05/03/11 2343 05/03/11 1525  CKTOTAL 102 102 92  CKMB 5.5* 4.8* 2.4  CKMBINDEX -- -- --  TROPONINI 0.79* 0.71* <0.30   BNP: No results found for this basename: PROBNP:3 in the last 72 hours D-Dimer: No  results found for this basename: DDIMER:2 in the last 72 hours CBG: No results found for this basename: GLUCAP:6 in the last 72 hours Hemoglobin A1C: No results found for this basename: HGBA1C in the last 72 hours Fasting Lipid Panel:  Basename 05/04/11 0645  CHOL 210*  HDL 51  LDLCALC 134*  TRIG 125  CHOLHDL 4.1  LDLDIRECT --   Thyroid Function Tests: No results found for this basename: TSH,T4TOTAL,FREET4,T3FREE,THYROIDAB in the last 72 hours Anemia Panel: No results found for this basename: VITAMINB12,FOLATE,FERRITIN,TIBC,IRON,RETICCTPCT in the last 72 hours Coagulation: No results found for this basename: LABPROT:2,INR:2 in the last 72 hours Urine Drug Screen: Drugs of Abuse  No results found for this basename: labopia, cocainscrnur, labbenz, amphetmu, thcu, labbarb    Alcohol Level: No results found for this basename: ETH:2 in the last 72 hours Urinalysis: No results found for this basename: COLORURINE:2,APPERANCEUR:2,LABSPEC:2,PHURINE:2,GLUCOSEU:2,HGBUR:2,BILIRUBINUR:2,KETONESUR:2,PROTEINUR:2,UROBILINOGEN:2,NITRITE:2,LEUKOCYTESUR:2 in the last 72 hours Misc. Labs:  EKG today: Normal sinus rhythm with a heart rate of 67 beats per minute, no ST or T wave elevation or depression.  Micro: No results found for this or any previous visit (from the past 240 hour(s)).  Studies/Results: Dg Chest  Portable 1 View  05/03/2011  *RADIOLOGY REPORT*  Clinical Data: Chest pain  PORTABLE CHEST - 1 VIEW  Comparison: 07/09/2005  Findings: Lungs are clear. No pleural effusion or pneumothorax.  Cardiomediastinal silhouette is within normal limits. Postsurgical changes related to prior CABG.  IMPRESSION: No evidence of acute cardiopulmonary disease.  Original Report Authenticated By: Charline Bills, M.D.    Medications: I have reviewed the patient's current medications.  Assessment: Active Problems:  HYPERLIPIDEMIA  HYPERTENSION  CAD  Chest pain  MI, acute, non ST segment  elevation   1. Acute non-ST elevation myocardial infarction. The patient's troponin I. has progressively increased from normal to 0.79. On admission, he was started on prophylactic Lovenox, nitroglycerin paste, aspirin, and simvastatin. Now that he has ruled in, Lovenox will be changed to full dose and metoprolol will be started twice a day. Given the patient's history of coronary artery disease, it is likely that he will need to be cathed. Currently, he is hemodynamically stable and without chest pain.  2. Hypertension. Metoprolol 12.5 mg twice a day will be started. It can certainly be increased.  3. Hyperlipidemia. His fasting lipid profile is noted above. He was started on simvastatin last night.   Plan:  1. I have discussed the patient's clinical findings with cardiologist Dr. Anne Fu  at [facility]. He agreed with medical management. Per our conversation, the patient will be transferred to Dr. Fabio Bering service for definitive diagnosis and and intervention. This was discussed with the patient who is in agreement.   LOS: 1 day   Holton Sidman 05/04/2011, 9:12 AM

## 2011-05-04 NOTE — Progress Notes (Signed)
Patient transferred to [facility] Dept 3700. Transfer was discussed with patient, cobra form signed. Report given to [facility] and to Bosie Clos, RN accepting nurse at [facility]. Patient transported via [facility] in stable condition.

## 2011-05-04 NOTE — H&P (Signed)
History and Physical  Patient ID: [name] MRN: [id number], SOB: December 23, 1955 56 y.o. Date of Encounter: 05/04/2011, 4:19 PM  Primary Physician:  [facility] [place] Primary Cardiologist: Dr. Charlton Haws [facility]) Primary Electrophysiologist:  None   Chief Complaint: NSTEMI  History of Present Illness: [name] is a 56 y.o. male who is transferred from [facility] with an NSTEMI.  He has a history of CAD, status post CABG in 2006 (LIMA-LAD, RIMA-acute marginal, SVG-DX, SVG-RCA), hypertension, hyperlipidemia. He was last seen by Dr. Eden Emms 5/11.  Last ETT-Myoview 7/11: EF 63%, no ischemia.  He presented to [facility] yesterday with chest pain occurring suddenly.  Initial EKG was normal. He was admitted for observation. His troponins returned positive and he is transferred to [facility] for further evaluation and treatment.  He has been in his usual state of health since last seen area he denies exertional chest pain or shortness of breath. He was watching TV yesterday when he suddenly had chest heaviness with radiation to his left arm and diaphoresis. This is reminiscent of his previous angina. He received nitroglycerin yesterday and is now on nitroglycerin paste. He has been chest pain-free since admission to the hospital. He denies any recent history of syncope, orthopnea, PND or edema.   Past Medical History  Diagnosis Date  . Myocardial infarct, old 2000    S/p Angioplasty 2001; s/p CABG x4 2006  . History of hypertension   . Hyperlipidemia   . Obesity   . Hydrocele 2012    Past Surgical History  Procedure Date  . Coronary artery bypass grafting May 2006    Four-vessel  . Pilonidal cyst excision   . Tonsillectomy      Prescriptions prior to admission  Medication Sig Dispense Refill  . aspirin EC 81 MG tablet Take 81 mg by mouth daily.        Current Facility-Administered Medications  Medication Dose Route Frequency Provider Last Rate Last Dose  .  0.9 % NaCl with KCl 20 mEq/ L  infusion   Intravenous Continuous Elliot Cousin, MD 50 mL/hr at 05/03/11 1905    . acetaminophen (TYLENOL) tablet 650 mg  650 mg Oral Q6H PRN Elliot Cousin, MD       Or  . acetaminophen (TYLENOL) suppository 650 mg  650 mg Rectal Q6H PRN Elliot Cousin, MD      . albuterol (PROVENTIL) (5 MG/ML) 0.5% nebulizer solution 2.5 mg  2.5 mg Nebulization Q2H PRN Elliot Cousin, MD      . alum & mag hydroxide-simeth (MAALOX/MYLANTA) 200-200-20 MG/5ML suspension 30 mL  30 mL Oral Q6H PRN Elliot Cousin, MD      . aspirin EC tablet 81 mg  81 mg Oral Daily Elliot Cousin, MD   81 mg at 05/04/11 1050  . docusate sodium (COLACE) capsule 100 mg  100 mg Oral Daily Elliot Cousin, MD   100 mg at 05/04/11 1050  . enoxaparin (LOVENOX) injection 110 mg  1 mg/kg Subcutaneous Q12H Elliot Cousin, MD   110 mg at 05/04/11 0859  . guaiFENesin-dextromethorphan (ROBITUSSIN DM) 100-10 MG/5ML syrup 5 mL  5 mL Oral Q4H PRN Elliot Cousin, MD      . metoprolol tartrate (LOPRESSOR) tablet 12.5 mg  12.5 mg Oral BID Elliot Cousin, MD   12.5 mg at 05/04/11 1050  . morphine 2 MG/ML injection 2 mg  2 mg Intravenous Q2H PRN Elliot Cousin, MD      . nitroGLYCERIN (NITROGLYN) 2 % ointment 0.5  inch  0.5 inch Topical Q6H Elliot Cousin, MD   0.5 inch at 05/04/11 1153  . ondansetron (ZOFRAN) tablet 4 mg  4 mg Oral Q6H PRN Elliot Cousin, MD       Or  . ondansetron [facility]) injection 4 mg  4 mg Intravenous Q6H PRN Elliot Cousin, MD      . oxyCODONE (Oxy IR/ROXICODONE) immediate release tablet 5 mg  5 mg Oral Q4H PRN Elliot Cousin, MD      . simvastatin (ZOCOR) tablet 20 mg  20 mg Oral q1800 Elliot Cousin, MD   20 mg at 05/03/11 1905  . DISCONTD: enoxaparin (LOVENOX) injection 40 mg  40 mg Subcutaneous Q24H Elliot Cousin, MD   40 mg at 05/03/11 1905  . DISCONTD: nitroGLYCERIN (NITROSTAT) SL tablet 0.4 mg  0.4 mg Sublingual Q5 min PRN Geoffery Lyons, MD   0.4 mg at 05/03/11 1513     Allergies: No Known  Allergies   History  Substance Use Topics  . Smoking status: Never Smoker   . Smokeless tobacco: Not on file  . Alcohol Use: No      Family History  Problem Relation Age of Onset  . Cancer Mother   . Cancer Father      ROS:  Please see the history of present illness.   Recent URI, now resolved.   All other systems reviewed and negative.   Vital Signs: Blood pressure 147/89, pulse 64, temperature 98.3 F (36.8 C), temperature source Oral, resp. rate 20, height 5\' 9"  (1.753 m), weight 240 lb 4.8 oz (109 kg), SpO2 97.00%.  PHYSICAL EXAM: General:  Well nourished, well developed, in no acute distress HEENT: normal Lymph: no adenopathy Neck: no JVD Endocrine:  No thryomegaly Vascular: No carotid bruits; FA pulses 2+ bilaterally without bruits, DP/PT 2+ bilat  Cardiac:  normal S1, S2; RRR; no murmur Lungs:  clear to auscultation bilaterally, no wheezing, rhonchi or rales Abd: soft, nontender, no hepatomegaly Ext: no edema Musculoskeletal:  No deformities, BUE and BLE strength normal and equal Skin: warm and dry Neuro:  CNs 2-12 intact, no focal abnormalities noted Psych:  Normal affect   EKG:  From [facility]: NSR, HR 86, normal axis, NSSTTW changes  Labs:   Lab Results  Component Value Date   WBC 7.9 05/04/2011   HGB 13.5 05/04/2011   HCT 39.3 05/04/2011   MCV 87.5 05/04/2011   PLT 214 05/04/2011     Lab 05/04/11 0645 05/03/11 1525  NA 139 --  K 4.0 --  CL 104 --  CO2 26 --  BUN 9 --  CREATININE 0.87 --  CALCIUM 9.0 --  PROT -- 7.6  BILITOT -- 0.5  ALKPHOS -- 74  ALT -- 24  AST -- 19  GLUCOSE 106* --    Basename 05/04/11 0645 05/03/11 2343 05/03/11 1525  CKTOTAL 102 102 92  CKMB 5.5* 4.8* 2.4  TROPONINI 0.79* 0.71* <0.30   Lab Results  Component Value Date   CHOL 210* 05/04/2011   HDL 51 05/04/2011   LDLCALC 134* 05/04/2011   TRIG 125 05/04/2011   No results found for this basename: DDIMER    No results found for this basename: INR,  PROTIME      Radiology/Studies:  Dg Chest Portable 1 View  05/03/2011   IMPRESSION: No evidence of acute cardiopulmonary disease.  Original Report Authenticated By: Charline Bills, M.D.     ASSESSMENT AND PLAN:  1. NSTEMI   Continue aspirin, Lovenox, nitro paste and beta blocker.  Check echo.  Risks and benefits of cardiac catheterization have been discussed with the patient.  These include bleeding, infection, kidney damage, stroke, heart attack, death.  The patient understands these risks and is willing to proceed.  Will put him on board for tomorrow.  2. CAD, s/p CABG As noted proceed with cardiac catheterization tomorrow.  3. HYPERLIPIDEMIA He ran out of statin a year ago and has not had follow up.  Restart high dose statin.  4. HYPERTENSION Treat with beta blocker.  Add ACE if needed.     Signed,  Tereso Newcomer, PA-C  05/04/2011, 4:19 PM  Patient examined and chart reviewed.  Previous CABG with similar anginal sounding pain.  Medical noncompliance  And missed appt at [facility].  Discussed cath with patient .  He is willing to proceed.  Hopefully we can do tomorrow  Charlton Haws 4:56 PM 05/04/2011

## 2011-05-05 ENCOUNTER — Encounter (HOSPITAL_COMMUNITY)
Admission: EM | Disposition: A | Discharge status: Home / Self Care | Payer: Self-pay | Source: Ambulatory Visit | Attending: Cardiovascular Disease

## 2011-05-05 DIAGNOSIS — I2510 Atherosclerotic heart disease of native coronary artery without angina pectoris: Secondary | ICD-10-CM

## 2011-05-05 DIAGNOSIS — I517 Cardiomegaly: Secondary | ICD-10-CM

## 2011-05-05 HISTORY — PX: LEFT HEART CATHETERIZATION WITH CORONARY/GRAFT ANGIOGRAM: SHX5450

## 2011-05-05 LAB — BASIC METABOLIC PANEL
GFR calc Af Amer: >90 mL/min (ref >90)
GFR calc non Af Amer: >90 mL/min (ref >90)
Glucose, Bld: 92 mg/dL (ref 70–99)
Potassium: 4.2 mEq/L (ref 3.5–5.1)
Sodium: 139 mEq/L (ref 135–145)

## 2011-05-05 SURGERY — LEFT HEART CATHETERIZATION WITH CORONARY/GRAFT ANGIOGRAM
Anesthesia: LOCAL

## 2011-05-05 MED ORDER — OXYCODONE-ACETAMINOPHEN 5-325 MG PO TABS: 1.0000 | ORAL_TABLET | ORAL | Status: DC | PRN

## 2011-05-05 MED ORDER — LIDOCAINE HCL (PF) 1 % IJ SOLN
INTRAMUSCULAR | Status: AC
  Filled 2011-05-05: qty 30

## 2011-05-05 MED ORDER — DIAZEPAM 2 MG PO TABS: 2.0000 mg | ORAL_TABLET | ORAL | Status: DC | PRN

## 2011-05-05 MED ORDER — SODIUM CHLORIDE 0.9 % IJ SOLN
3.0000 mL | Freq: Two times a day (BID) | INTRAMUSCULAR | Status: DC
  Administered 2011-05-05: 3 mL via INTRAVENOUS

## 2011-05-05 MED ORDER — ASPIRIN EC 325 MG PO TBEC
325.0000 mg | DELAYED_RELEASE_TABLET | Freq: Every day | ORAL | Status: DC
  Administered 2011-05-06: 325 mg via ORAL
  Filled 2011-05-05: qty 1

## 2011-05-05 MED ORDER — NITROGLYCERIN 0.2 MG/ML ON CALL CATH LAB
INTRAVENOUS | Status: AC
  Filled 2011-05-05: qty 1

## 2011-05-05 MED ORDER — SODIUM CHLORIDE 0.9 % IV SOLN
1.0000 mL/kg/h | INTRAVENOUS | Status: DC
  Administered 2011-05-05 – 2011-05-06 (×2): 1 mL/kg/h via INTRAVENOUS

## 2011-05-05 MED ORDER — MIDAZOLAM HCL 2 MG/2ML IJ SOLN
INTRAMUSCULAR | Status: AC
  Filled 2011-05-05: qty 2

## 2011-05-05 MED ORDER — ONDANSETRON HCL 4 MG/2ML IJ SOLN: 4.0000 mg | Freq: Four times a day (QID) | INTRAMUSCULAR | Status: DC | PRN

## 2011-05-05 MED ORDER — SODIUM CHLORIDE 0.9 % IV SOLN: 250.0000 mL | INTRAVENOUS | Status: DC

## 2011-05-05 MED ORDER — HEPARIN SOD (PORCINE) IN D5W 100 UNIT/ML IV SOLN
1400.0000 [IU]/h | INTRAVENOUS | Status: DC
  Administered 2011-05-05: 1400 [IU]/h via INTRAVENOUS
  Filled 2011-05-05 (×2): qty 250

## 2011-05-05 MED ORDER — SODIUM CHLORIDE 0.9 % IJ SOLN: 3.0000 mL | INTRAMUSCULAR | Status: DC | PRN

## 2011-05-05 MED ORDER — HEPARIN (PORCINE) IN NACL 2-0.9 UNIT/ML-% IJ SOLN
INTRAMUSCULAR | Status: AC
  Filled 2011-05-05: qty 2000

## 2011-05-05 MED ORDER — ACETAMINOPHEN 325 MG PO TABS: 650.0000 mg | ORAL_TABLET | ORAL | Status: DC | PRN

## 2011-05-05 NOTE — Progress Notes (Signed)
*  PRELIMINARY RESULTS* Echocardiogram 2D Echocardiogram has been performed.  Clide Deutscher 05/05/2011, 11:08 AM

## 2011-05-05 NOTE — Interval H&P Note (Signed)
History and Physical Interval Note:  05/05/2011 1:53 PM  [name]  has presented today for surgery, with the diagnosis of cp  The various methods of treatment have been discussed with the patient and family. After consideration of risks, benefits and other options for treatment, the patient has consented to  Procedure(s): LEFT HEART CATHETERIZATION WITH CORONARY/GRAFT ANGIOGRAM as a surgical intervention .  The patients' history has been reviewed, patient examined, no change in status, stable for surgery.  I have reviewed the patients' chart and labs.  Questions were answered to the patient's satisfaction.     Charlton Haws  05/05/2011

## 2011-05-05 NOTE — Op Note (Signed)
Catheterization  Indication: Chest Pain  Procedure: After informed consent and clinical "time out" the right groin was prepped and draped in a sterile fashion.  A 5Fr sheath was placed in the right femoral artery using seldinger technique and local lidocaine.  Standard JL4, JR4 and angled pigtail catheters were used to engage the coronary arteries.  Coronary arteries were visualized in orthogonal views using caudal and cranial angulation.  RAO ventriculography was done using  28 * cc of contrast.    Medications:   Versed: 2 mg's  Fentanyl: 0 ug's  Coronary Arteries: Right dominant with no anomalies  LM:50% diffuse proximal  LAD: 100% at take off of septal perforator  Circumflex: 100% mid  RCA: 100% proximal  Grafts:  SVG- OM normal SVG- RCA 30% mid body.  RCA small artery with 30% distal disease Free RIMA:- Diagonal 30% mid body LIMA: to LAD normal with 30% disease in distal LAD  Ventriculography: EF: 55 %, no discrete RWMA;s  Hemodynamics:  Aortic Pressure: 151/76  mmHg  LV Pressure: 157/10  mmHg  Impression:  No areas of potential ischemia.  Patent grafts and good LV function.  Pain would appear to be non-cardiac.  Given late hour and small hematoma while  Sheath in as well as patient obesity will keep overnight.  Plan D/C in am if Hct stable  Charlton Haws 05/05/2011 1:54 PM

## 2011-05-05 NOTE — Brief Op Note (Signed)
See operative note [name] 1:53 PM 05/05/2011

## 2011-05-05 NOTE — Progress Notes (Signed)
ANTICOAGULATION CONSULT NOTE -  Pharmacy Consult for Heparin Indication: chest pain/ACS  No Known Allergies  Patient Measurements: Height: 5\' 9"  (175.3 cm) Weight: 243 lb 2.7 oz (110.3 kg) IBW/kg (Calculated) : 70.7    Vital Signs: Temp: 98 F (36.7 C) (02/11 0500) BP: 114/67 mmHg (02/11 0500) Pulse Rate: 58  (02/11 0500)  Labs:  Basename 05/05/11 0520 05/04/11 1651 05/04/11 0645 05/03/11 2343 05/03/11 1525  HGB -- -- 13.5 -- 14.7  HCT -- -- 39.3 -- 41.5  PLT -- -- 214 -- 233  APTT -- 37 -- -- --  LABPROT -- 13.6 -- -- --  INR -- 1.02 -- -- --  HEPARINUNFRC -- -- -- -- --  CREATININE 0.86 -- 0.87 -- 0.96  CKTOTAL -- -- 102 102 92  CKMB -- -- 5.5* 4.8* 2.4  TROPONINI -- -- 0.79* 0.71* <0.30   Estimated Creatinine Clearance: 117.3 ml/min (by C-G formula based on Cr of 0.86).  Assessment: 56 yo male with chest pain, awaiting cath, for Heparin.  Received Lovenox 110 mg SQ at 10 pm last night  Goal of Therapy:  Heparin level 0.3-0.7   Plan:  Start Heparin 1400 units/hr at 8 am F/U after cath this afternoon.  Eddie Candle 05/05/2011,7:08 AM

## 2011-05-05 NOTE — Progress Notes (Signed)
   Patient Name: [name] Date of Encounter: 05/05/2011, 6:51 AM    Subjective  No chest pain since they gave him NTG in [facility]. Feels great.   Objective   Telemetry: NSR/sinus bradycardia Physical Exam: Filed Vitals:   05/05/11 0500  BP: 114/67  Pulse: 58  Temp: 98 F (36.7 C)  Resp: 18   General: Well developed, well nourished, in no acute distress. Head: Normocephalic, atraumatic, sclera non-icteric, no xanthomas, nares are without discharge.  Neck: Negative for carotid bruits. JVD not elevated. Lungs: Clear bilaterally to auscultation without wheezes, rales, or rhonchi. Breathing is unlabored. Heart: RRR S1 S2 without murmurs, rubs, or gallops.  Abdomen: Soft, non-tender, non-distended with normoactive bowel sounds. No hepatomegaly. No rebound/guarding. No obvious abdominal masses. Msk:  Strength and tone appear normal for age. Extremities: No clubbing or cyanosis. No edema.  Distal pedal pulses are 2+ and equal bilaterally. Neuro: Alert and oriented X 3. Moves all extremities spontaneously. Psych:  Responds to questions appropriately with a normal affect.    Intake/Output Summary (Last 24 hours) at 05/05/11 0651 Last data filed at 05/05/11 0500  Gross per 24 hour  Intake    800 ml  Output      0 ml  Net    800 ml    Labs:  [name] 05/04/11 0645 05/03/11 1525  NA 139 137  K 4.0 3.6  CL 104 100  CO2 26 26  GLUCOSE 106* 109*  BUN 9 11  CREATININE 0.87 0.96  CALCIUM 9.0 9.7  MG -- --  PHOS -- --    Basename 05/03/11 1525  AST 19  ALT 24  ALKPHOS 74  BILITOT 0.5  PROT 7.6  ALBUMIN 4.3    Basename 05/03/11 1525  LIPASE 25  AMYLASE --    Basename 05/04/11 0645 05/03/11 1525  WBC 7.9 8.1  NEUTROABS -- 4.7  HGB 13.5 14.7  HCT 39.3 41.5  MCV 87.5 86.8  PLT 214 233    Basename 05/04/11 0645 05/03/11 2343 05/03/11 1525  CKTOTAL 102 102 92  CKMB 5.5* 4.8* 2.4  TROPONINI 0.79* 0.71* <0.30    Basename 05/03/11 1525  HGBA1C 5.7*     Basename 05/04/11 0645  CHOL 210*  HDL 51  LDLCALC 134*  TRIG 125  CHOLHDL 4.1    Basename 05/03/11 1525  TSH 1.478  T4TOTAL --  T3FREE --  THYROIDAB --   Radiology/Studies:  1. Chest Portable 1 View  05/03/2011  *RADIOLOGY REPORT*  Clinical Data: Chest pain  PORTABLE CHEST - 1 VIEW  Comparison: 07/09/2005  Findings: Lungs are clear. No pleural effusion or pneumothorax.  Cardiomediastinal silhouette is within normal limits. Postsurgical changes related to prior CABG.  IMPRESSION: No evidence of acute cardiopulmonary disease.  Original Report Authenticated By: Charline Bills, M.D.     Assessment and Plan   1. NSTEMI [with hx of CAD s/p CABG 2006] - Plan cath today (patient agreeable) - Will change Lovenox to Heparin per pharmacy - timing of cath per lab is tentatively this afternoon - He is not currently on NTG paste and is asymptomatic. Will d/c order - Continue ASA, BB (limited by HR), Crestor. Will d/c duplicate 81mg  ASA order in EMR.  2. HTN - BP looks good this morning. Follow.  3. Hyperlipidemia - LDL 134 not previously on statin. - Continue Crestor  Signed, Ronie Spies PA-C

## 2011-05-05 NOTE — Progress Notes (Signed)
Pain free R/O BP under good control.  Noncompliant with chol meds.  Cath latter today.  Told patient it would be late and possibly tomorrow Charlton Haws 7:59 AM 05/05/2011

## 2011-05-05 NOTE — Progress Notes (Signed)
UR Completed. [name], Nathan Goar F [phone]  

## 2011-05-06 ENCOUNTER — Encounter (HOSPITAL_COMMUNITY): Payer: Self-pay | Admitting: Physician Assistant

## 2011-05-06 DIAGNOSIS — R079 Chest pain, unspecified: Secondary | ICD-10-CM

## 2011-05-06 LAB — BASIC METABOLIC PANEL
CO2: 23 mEq/L (ref 19–32)
Calcium: 9.3 mg/dL (ref 8.4–10.5)
GFR calc Af Amer: >90 mL/min (ref >90)
GFR calc non Af Amer: >90 mL/min (ref >90)
Sodium: 138 mEq/L (ref 135–145)

## 2011-05-06 LAB — CBC
HCT: 38.9 % — ABNORMAL LOW (ref 39.0–52.0)
Hemoglobin: 13.3 g/dL (ref 13.0–17.0)
MCH: 29.6 pg (ref 26.0–34.0)
MCHC: 34.2 g/dL (ref 30.0–36.0)
MCV: 86.6 fL (ref 78.0–100.0)
Platelets: 205 10*3/uL (ref 150–400)
RBC: 4.49 MIL/uL (ref 4.22–5.81)
RDW: 12.9 % (ref 11.5–15.5)
WBC: 8.3 10*3/uL (ref 4.0–10.5)

## 2011-05-06 MED ORDER — NITROGLYCERIN 0.4 MG SL SUBL: 0.4000 mg | SUBLINGUAL_TABLET | SUBLINGUAL | Status: DC | PRN

## 2011-05-06 MED ORDER — ATORVASTATIN CALCIUM 40 MG PO TABS: 40.0000 mg | ORAL_TABLET | Freq: Every day | ORAL | Status: DC

## 2011-05-06 MED ORDER — METOPROLOL TARTRATE 25 MG PO TABS: 12.5000 mg | ORAL_TABLET | Freq: Two times a day (BID) | ORAL | Status: DC

## 2011-05-06 NOTE — Progress Notes (Signed)
Pt discharged to home per MD order. Pt received all discharge instructions and medication information including prescriptions and follow up appointment information. Pt VSS with no complaints of chest pain. Pt escorted to main lobby via volunteer services to meet his ride.  [name]

## 2011-05-06 NOTE — Progress Notes (Signed)
@ Subjective:  Denies SSCP, palpitations or Dyspnea   Objective:  Filed Vitals:   05/05/11 1800 05/05/11 1900 05/05/11 2100 05/06/11 0500  BP: 117/75 129/71 113/72 117/76  Pulse: 69 74 83 62  Temp:   97.4 F (36.3 C) 98.2 F (36.8 C)  TempSrc:   Oral Oral  Resp:   20 20  Height:      Weight:      SpO2: 98% 100% 98% 99%    Intake/Output from previous day:  Intake/Output Summary (Last 24 hours) at 05/06/11 0805 Last data filed at 05/05/11 1821  Gross per 24 hour  Intake    720 ml  Output   1000 ml  Net   -280 ml    Physical Exam: General appearance: alert and cooperative Neck: no adenopathy, no carotid bruit, no JVD, supple, symmetrical, trachea midline and thyroid not enlarged, symmetric, no tenderness/mass/nodules Lungs: clear to auscultation bilaterally Heart: regular rate and rhythm, S1, S2 normal, no murmur, click, rub or gallop Abdomen: soft, non-tender; bowel sounds normal; no masses,  no organomegaly Extremities: extremities normal, atraumatic, no cyanosis or edema Pulses: 2+ and symmetric Cath site A with no hematoma  Lab Results: Basic Metabolic Panel:  Basename 05/06/11 0510 05/05/11 0520  NA 138 139  K 4.1 4.2  CL 104 103  CO2 23 26  GLUCOSE 89 92  BUN 9 12  CREATININE 0.81 0.86  CALCIUM 9.3 9.2  MG -- --  PHOS -- --   Liver Function Tests:  Basename 05/03/11 1525  AST 19  ALT 24  ALKPHOS 74  BILITOT 0.5  PROT 7.6  ALBUMIN 4.3    Basename 05/03/11 1525  LIPASE 25  AMYLASE --   CBC:  Basename 05/06/11 0510 05/04/11 0645 05/03/11 1525  WBC 8.3 7.9 --  NEUTROABS -- -- 4.7  HGB 13.3 13.5 --  HCT 38.9* 39.3 --  MCV 86.6 87.5 --  PLT 205 214 --   Cardiac Enzymes:  Basename 05/04/11 0645 05/03/11 2343 05/03/11 1525  CKTOTAL 102 102 92  CKMB 5.5* 4.8* 2.4  CKMBINDEX -- -- --  TROPONINI 0.79* 0.71* <0.30   BNP: No components found with this basename: POCBNP:3 D-Dimer: No results found for this basename: DDIMER:2 in the last 72  hours Hemoglobin A1C:  Basename 05/03/11 1525  HGBA1C 5.7*   Fasting Lipid Panel:  Basename 05/04/11 0645  CHOL 210*  HDL 51  LDLCALC 134*  TRIG 125  CHOLHDL 4.1  LDLDIRECT --   Thyroid Function Tests:  Basename 05/03/11 1525  TSH 1.478  T4TOTAL --  T3FREE --  THYROIDAB --   Anemia Panel: No results found for this basename: VITAMINB12,FOLATE,FERRITIN,TIBC,IRON,RETICCTPCT in the last 72 hours  Imaging: No results found.  Cardiac Studies:  ECG:  NSR no acute changes   Telemetry: NSR no arrhtymia    Medications:     . aspirin EC  325 mg Oral Daily  . aspirin EC  81 mg Oral Daily  . docusate sodium  100 mg Oral Daily  . heparin      . lidocaine      . metoprolol tartrate  12.5 mg Oral BID  . midazolam      . nitroGLYCERIN      . rosuvastatin  40 mg Oral q1800  . sodium chloride  3 mL Intravenous Q12H  . sodium chloride  3 mL Intravenous Q12H       . sodium chloride 1 mL/kg/hr (05/06/11 0328)  . sodium chloride    .  0.9 % NaCl with KCl 20 mEq / L 50 mL/hr at 05/03/11 1905  . DISCONTD: sodium chloride 50 mL/hr at 05/05/11 1100  . DISCONTD: heparin 1,400 Units/hr (05/05/11 1100)    Assessment/Plan:  Chest Pain:  Patent grafts normal EF medical Rx Chol:  Continue statin  F/U with [facility] in [place] I can see in 3 months  Charlton Haws 05/06/2011, 8:05 AM

## 2011-05-06 NOTE — Discharge Summary (Signed)
Discharge Summary   Patient ID: [name] MRN: [id number], DOB/AGE: 1955-10-30 56 y.o. Admit date: 05/03/2011 D/C date:     05/06/2011   Primary Discharge Diagnoses:  1. Chest pain with hx of CAD s/p CABG - grafts patent by cath 05/05/11 with no areas of potential ischemia - mildly elevated cardiac enzymes of uncertain significance 2. HTN 3. Hyperlipidemia - LDL 134 - lipitor initiated at discharge - will need f/u LFTs/lipids 6 weeks  Hospital Course: 56 y/o M with hx of CAD s/p CABg presented to [facility] with complaints of chest pain while watching TV. He developed chest heaviness with radiation to his left arm and diaphoresis reminiscent of his previous angina. He got relief with NTG there and had been chest pain free since. Initial troponin was negative but subsequent sets demonstrated mildly elevated troponin up to 0.79. He was not tachycardic, tachypnic or hypoxic. He did not have any LEE or acute CXR findings (normal cardiomediastinal silhouette). He was placed on ASA, BB, statin and heparin therapy and transferred to [facility] for consideration for cath. He underwent this procedure yesterday showing patent grafts and good LV function with no areas of potential ischemia. EF was 55% with no discrete WMA. He had a small hematoma post cath which was observed and remained stable. Hgb remained stable. Thus his cardiac enzymes were felt of unclear clinical significance. Today the patient is feeling well. Dr. Eden Emms has seen and examined the patient and feels he is stable for discharge.   Discharge Vitals: Blood pressure 117/76, pulse 62, temperature 98.2 F (36.8 C), temperature source Oral, resp. rate 20, height 5\' 9"  (1.753 m), weight 243 lb 2.7 oz (110.3 kg), SpO2 99.00%.  Labs: Lab Results  Component Value Date   WBC 8.3 05/06/2011   HGB 13.3 05/06/2011   HCT 38.9* 05/06/2011   MCV 86.6 05/06/2011   PLT 205 05/06/2011     Lab 05/06/11 0510 05/03/11 1525  NA 138 --  K 4.1 --  CL 104  --  CO2 23 --  BUN 9 --  CREATININE 0.81 --  CALCIUM 9.3 --  PROT -- 7.6  BILITOT -- 0.5  ALKPHOS -- 74  ALT -- 24  AST -- 19  GLUCOSE 89 --    Basename 05/04/11 0645 05/03/11 2343 05/03/11 1525  CKTOTAL 102 102 92  CKMB 5.5* 4.8* 2.4  TROPONINI 0.79* 0.71* <0.30   Lab Results  Component Value Date   CHOL 210* 05/04/2011   HDL 51 05/04/2011   LDLCALC 134* 05/04/2011   TRIG 125 05/04/2011    Diagnostic Studies/Procedures   1. Chest Portable 1 View 05/03/2011  *RADIOLOGY REPORT* IMPRESSION: No evidence of acute cardiopulmonary disease.  Original Report Authenticated By: Charline Bills, M.D.   2. 2D Echo 05/05/11 Study Conclusions - Left ventricle: The cavity size was normal. Wall thickness was increased in a pattern of mild LVH. Systolic function was normal. The estimated ejection fraction was 55%. Although no diagnostic regional wall motion abnormality was identified, this possibility cannot be completely excluded on the basis of this study. Features are consistent with a pseudonormal left ventricular filling pattern, with concomitant abnormal relaxation and increased filling pressure (grade 2 diastolic dysfunction). - Aortic valve: There was no stenosis. - Mitral valve: No significant regurgitation. - Left atrium: The atrium was mildly dilated. - Right ventricle: The cavity size was mildly dilated. Systolic function was normal. - Right atrium: The atrium was mildly dilated. - Pulmonary arteries: No complete TR doppler jet  so unable to estimate PA systolic pressure. - Systemic veins: IVC measured 2.0 cm with normal respirophasic variation, suggesting RA pressure 6-10 mmHg. Impressions: - Technically difficult study with poor acoustic windows. Mild LV hypertrophy with EF 55%. No definite wall motion abnormalities but difficult images. Moderate diastolic dysfunction. Mildly dilated RV with normal systolic function.  Discharge Medications   Medication List  As of 05/06/2011  8:40 AM    TAKE these medications         aspirin EC 81 MG tablet   Take 81 mg by mouth daily.      atorvastatin 40 MG tablet   Commonly known as: LIPITOR   Take 1 tablet (40 mg total) by mouth at bedtime.      metoprolol tartrate 25 MG tablet   Commonly known as: LOPRESSOR   Take 0.5 tablets (12.5 mg total) by mouth 2 (two) times daily.      nitroGLYCERIN 0.4 MG SL tablet   Commonly known as: NITROSTAT   Place 1 tablet (0.4 mg total) under the tongue every 5 (five) minutes x 3 doses as needed for chest pain.            Disposition   The patient will be discharged in stable condition to home. Discharge Orders    Future Appointments: Provider: Department: Dept Phone: Center:   08/05/2011 10:00 AM Wendall Stade, MD [facility] [phone] [id number]     Future Orders Please Complete By Expires   Diet - low sodium heart healthy      Increase activity slowly      Comments:   No driving for today. No lifting over 5 lbs for 1 week. No sexual activity for 1 week. You may return to work tomorrow per Dr. Eden Emms with above restrictions. May return to full duty 05/12/11 so long as you feel well. Keep procedure site clean & dry. If you notice increased pain, swelling, bleeding or pus, call/return!  You may shower, but no soaking baths/hot tubs/pools for 1 week.       Follow-up Information    Follow up with [facility]. (Dr. Eden Emms wants you to continue to follow up with primary care doctor. You will also need schedule follow-up liver/cholesterol panels in 6 weeks since starting your cholesterol medicine.)       Follow up with Charlton Haws, MD. (08/05/11 at 10am)    Contact information:   [address], [address] [place] [address] [phone]          Dr. Eden Emms has instructed the patient that he may return to work tomorrow - restrictions regarding lifting for the short-term have been reviewed with the patient and he may go back to full  duty 05/12/11 given cath site.   Duration of Discharge Encounter: 35 minutes including physician and PA time.  Signed, Lilya Smitherman PA-C 05/06/2011, 8:40 AM

## 2011-05-15 ENCOUNTER — Telehealth: Payer: Self-pay | Admitting: *Deleted

## 2011-05-15 NOTE — Telephone Encounter (Signed)
SPOKE WITH PT IN OFFICE CONT TO C/O CHEST PAIN  AFTER NORM CATH ENCOURAGED TO F/U WITH PMD PT THINKS  PAIN MAYBE COMING FROM GALL BLADDER

## 2011-05-19 ENCOUNTER — Other Ambulatory Visit (HOSPITAL_COMMUNITY): Payer: Self-pay | Admitting: Family Medicine

## 2011-05-19 DIAGNOSIS — I10 Essential (primary) hypertension: Secondary | ICD-10-CM

## 2011-05-19 DIAGNOSIS — R079 Chest pain, unspecified: Secondary | ICD-10-CM

## 2011-05-22 ENCOUNTER — Ambulatory Visit (HOSPITAL_COMMUNITY): Payer: 59

## 2011-05-22 ENCOUNTER — Encounter (HOSPITAL_COMMUNITY): Payer: 59

## 2011-05-23 ENCOUNTER — Encounter (HOSPITAL_COMMUNITY)
Admission: RE | Admit: 2011-05-23 | Discharge: 2011-05-23 | Disposition: A | Discharge status: Home / Self Care | Payer: 59 | Source: Ambulatory Visit | Attending: Family Medicine | Admitting: Family Medicine

## 2011-05-23 ENCOUNTER — Ambulatory Visit (HOSPITAL_COMMUNITY)
Admission: RE | Admit: 2011-05-23 | Discharge: 2011-05-23 | Disposition: A | Discharge status: Home / Self Care | Payer: 59 | Source: Ambulatory Visit | Attending: Family Medicine | Admitting: Family Medicine

## 2011-05-23 DIAGNOSIS — I10 Essential (primary) hypertension: Secondary | ICD-10-CM

## 2011-05-23 DIAGNOSIS — R079 Chest pain, unspecified: Secondary | ICD-10-CM

## 2011-05-23 DIAGNOSIS — R110 Nausea: Secondary | ICD-10-CM | POA: Insufficient documentation

## 2011-05-23 DIAGNOSIS — R109 Unspecified abdominal pain: Secondary | ICD-10-CM | POA: Insufficient documentation

## 2011-05-23 DIAGNOSIS — R748 Abnormal levels of other serum enzymes: Secondary | ICD-10-CM | POA: Insufficient documentation

## 2011-05-23 MED ORDER — SINCALIDE 5 MCG IJ SOLR
INTRAMUSCULAR | Status: AC
  Administered 2011-05-23: 2.14 ug via INTRAVENOUS
  Filled 2011-05-23: qty 5

## 2011-05-23 MED ORDER — TECHNETIUM TC 99M MEBROFENIN IV KIT
5.0000 | PACK | Freq: Once | INTRAVENOUS | Status: AC | PRN
  Administered 2011-05-23: 5 via INTRAVENOUS

## 2011-05-29 ENCOUNTER — Encounter (INDEPENDENT_AMBULATORY_CARE_PROVIDER_SITE_OTHER): Payer: Self-pay | Admitting: *Deleted

## 2011-07-07 ENCOUNTER — Encounter (INDEPENDENT_AMBULATORY_CARE_PROVIDER_SITE_OTHER): Payer: Self-pay | Admitting: Internal Medicine

## 2011-07-07 ENCOUNTER — Ambulatory Visit (INDEPENDENT_AMBULATORY_CARE_PROVIDER_SITE_OTHER): Payer: 59 | Admitting: Internal Medicine

## 2011-07-07 VITALS — BP 130/76 | HR 74 | Temp 97.9°F | Resp 20 | Ht 69.0 in | Wt 246.4 lb

## 2011-07-07 DIAGNOSIS — K7689 Other specified diseases of liver: Secondary | ICD-10-CM

## 2011-07-07 DIAGNOSIS — R0789 Other chest pain: Secondary | ICD-10-CM

## 2011-07-07 DIAGNOSIS — K219 Gastro-esophageal reflux disease without esophagitis: Secondary | ICD-10-CM

## 2011-07-07 DIAGNOSIS — K760 Fatty (change of) liver, not elsewhere classified: Secondary | ICD-10-CM | POA: Insufficient documentation

## 2011-07-07 MED ORDER — DEXLANSOPRAZOLE 60 MG PO CPDR: 60.0000 mg | DELAYED_RELEASE_CAPSULE | Freq: Every day | ORAL | Status: DC

## 2011-07-07 NOTE — Consult Note (Signed)
Note dictated; Job [phone].

## 2011-07-07 NOTE — Patient Instructions (Signed)
Call office if she had another episode of chest pain

## 2011-07-14 NOTE — Consult Note (Signed)
NAME:  [name], [name] NO.:  MEDICAL RECORD NO.:  [id number]  LOCATION:                                 FACILITY:  PHYSICIAN:  [name], M.D.    DATE OF BIRTH:  Sep 20, 1955  DATE OF CONSULTATION:  07/07/2011 DATE OF DISCHARGE:                                CONSULTATION   CONSULTING PHYSICIAN:  Nathan Ruths, MD  REASON FOR CONSULTATION:  Atypical chest pain.  Cardiac workup negative in a patient with known history of CAD.  HISTORY OF PRESENT ILLNESS:  [name] is 56 year old Caucasian male who is being evaluated at request by Dr. Regino [name].  He was in usual state of health until afternoon of May 03, 2011, when he developed retrosternal pain.  This pain radiated to his left arm, was associated with nausea and diaphoresis.  He described this pain to be intense pain. He took nitroglycerin and did notice some relief.  Pain lasted for 40 minutes.  He went to emergency room at [facility].  He was evaluated and subsequently hospitalized.  His troponin level was elevated.  Because of history of CAD and prior CABG, he was transferred to [facility].  He underwent cardiac cath by primary cardiologist Dr. Charlton [name] on May 05, 2011.  All 4 of his grafts were patent and he had good LV function.  It was felt that his pain was noncardiac, and he was advised to follow with Dr. Regino [name].  Dr. Regino [name], started him on Dexilant and scheduled him for ultrasound and hepatobiliary scan which were both performed on May 23, 2011.  Ultrasound was negative for cholelithiasis, gallbladder wall thickening or dilated bile duct.  It showed fatty liver.  Spleen was not enlarged.  He subsequently had a HIDA scan with CCK and EF was around 60%.  He did not have reproduction of his symptoms.  Patient states that he had another episode of pain 1 day after his cardiac cath but he has been pain free since then.  He has history of reflux esophagitis as reviewed under  past medical history, but he had not been on any medication until recently.  He rarely experiences heartburn.  He may have postprandial regurgitation no more than twice a month.  He denies dysphagia, odynophagia, hoarseness, sore throat, or chronic cough.  He states he has gained 20 pounds in the last 1 year. He walks on a treadmill, but not every day.  When he does, he generally walks 2 mile to 30 minutes.  He has very good appetite.  He denies melena or rectal bleeding.  He is experiencing no side effects with Dexilant.  CURRENT MEDICATIONS: 1. Aspirin 81 mg p.o. daily. 2. Atorvastatin 40 mg p.o. daily. 3. Dexlansoprazole 60 mg p.o. q.a.m. 4. Metoprolol 12.5 mg p.o. b.i.d. 5. Nitrostat 0.4 mg sublingual p.r.n.  PAST MEDICAL HISTORY:  He has history of erosive reflux esophagitis.  He had EGD by me in October 2004, revealing erosive esophagitis and small sliding hiatal hernia.  He was treated at that time with dietary measures and PPI, but he has been off therapy for a long time.  Fatty liver based on ultrasonography  also 2004, his transaminases always been normal.  He suffered an MI in August 2003, it was mild.  He had 4-vessel coronary artery bypass graft in May 2006 and has done well.  Recent cardiac cath results are noted under history of present illness.  He has good LV function.  All grafts are patent.  He has been on therapy for hyperlipidemia for 13 years.  He had tonsillectomy at age 56.  He had screening colonoscopy by Dr. Lovell [name], in August 2006, and was normal.  He has been hypertensive for about 6 months.  He has left hydrocele.  Obesity.  His BMI is 336.39.  ALLERGIES:  NK.  FAMILY HISTORY:  Mother died of cervical carcinoma at age 93 after she received the first dose of radiation therapy, details not available. Father had throat cancer and died at 31.  He apparently lived couple of years after the diagnosis was made.  The patient does not have  any siblings.  SOCIAL HISTORY:  He is divorced.  He has 3 children in good health.  He has been working at [employer] for the last 17 years.  He has never smoked cigarettes or drank alcohol.  PHYSICAL EXAMINATION:  VITAL SIGNS:  Weight 246.4 pounds, he is 69 inches tall, pulse 74 per minute, blood pressure 130/76, temp is 97.9, respirations 20. HEENT:  Conjunctivae is pink.  Sclerae nonicteric.  Oropharyngeal mucosa is normal.  No neck masses or thyromegaly noted. CARDIAC:  With regular rhythm.  Normal S1 and S2.  No murmur or gallop noted. LUNGS:  Clear to auscultation. ABDOMEN:  Full.  Bowel sounds are normal.  On palpation, soft abdomen without tenderness, organomegaly, or masses. RECTAL:  Deferred. EXTREMITIES:  No peripheral edema or clubbing noted.  LABORATORY DATA:  From May 03, 2011, WBC 8.1, H and H 14.7 and 41.5, platelet count 233,000.  Electrolytes within normal limits.  Glucose 109, BUN 11, creatinine 0.96, calcium 9.7, total bilirubin 0.5, AP 74, AST 19, ALT 24, total protein 7.6 with albumin of 4.3.  Ultrasound and HIDA scan reviewed.  ASSESSMENT: 1. [name] is 56 year old Caucasian male with known coronary artery     disease status post four-vessel CABG in May 2006, who was recently     hospitalized with prolonged chest pain and noted to have mildly     elevated troponin levels, but cardiac cath reveals normal LV     function and patent grafts and therefore his pain was felt to be     noncardiac.  Workup for gallbladder disease has been negative.  He     has not experienced any more episodes of pain since he has been on     Dexilant.  He does have well-documented history of erosive reflux     esophagitis.  Even though, his symptoms are not typical it is quite     possible that his atypical chest pain is secondary to     gastroesophageal reflux disease.  Since, he is entirely     asymptomatic, I would recommend considering diagnostic EGD should     he  experience another episode of chest pain. 2. Fatty liver.  His transaminases are normal.  He must exercise more     regularly than he is doing and he should make every effort to try     to get his weight down to at least 220 pounds.  If he remains     active, I doubt that his fatty liver, would ever become a  clinically issue.  RECOMMENDATIONS: 1. The patient reassured.  Should he experience another episode of     chest pain, he will contact us to schedule EGD. 2. New prescription given for Dexilant 60 mg p.o. q.a.m., with     multiple refills. 3. He will return for office visit in 1 year at which time, I may     consider decreasing dose of his PPI.  We appreciate the opportunity to participate in the care of this gentleman.          ______________________________ [name], M.D.     NR/MEDQ  D:  07/07/2011  T:  07/07/2011  Job:  [id number]  cc:   [name], M.D. Fax: [phone]  Noralyn Pick. Eden Emms, MD, [facility] [address]  [address] [place] [address]

## 2011-07-14 NOTE — Progress Notes (Signed)
CONSULTING PHYSICIAN: Kirk Ruths, MD  REASON FOR CONSULTATION: Atypical chest pain. Cardiac workup negative  in a patient with known history of CAD.  HISTORY OF PRESENT ILLNESS: [name] is 56 year old Caucasian male who is  being evaluated at request by Dr. Regino Schultze. He was in usual state of  health until afternoon of May 03, 2011, when he developed  retrosternal pain. This pain radiated to his left arm, was associated  with nausea and diaphoresis. He described this pain to be intense pain.  He took nitroglycerin and did notice some relief. Pain lasted for 40  minutes. He went to emergency room at [facility]. He was  evaluated and subsequently hospitalized. His troponin level was  elevated. Because of history of CAD and prior CABG, he was transferred  to [facility]. He underwent cardiac cath by primary cardiologist Dr. Charlton Haws on May 05, 2011. All 4 of his grafts were patent and he had  good LV function. It was felt that his pain was noncardiac, and he was  advised to follow with Dr. Regino Schultze. Dr. Regino Schultze, started him on  Dexilant and scheduled him for ultrasound and hepatobiliary scan which  were both performed on May 23, 2011. Ultrasound was negative for  cholelithiasis, gallbladder wall thickening or dilated bile duct. It  showed fatty liver. Spleen was not enlarged. He subsequently had a  HIDA scan with CCK and EF was around 60%. He did not have reproduction  of his symptoms.  Patient states that he had another episode of pain 1 day after his  cardiac cath but he has been pain free since then. He has history of  reflux esophagitis as reviewed under past medical history, but he had  not been on any medication until recently. He rarely experiences  heartburn. He may have postprandial regurgitation no more than twice a  month. He denies dysphagia, odynophagia, hoarseness, sore throat, or  chronic cough. He states he has gained 20 pounds in the last 1 year.  He  walks on a treadmill, but not every day. When he does, he generally  walks 2 mile to 30 minutes. He has very good appetite. He denies  melena or rectal bleeding. He is experiencing no side effects with  Dexilant.  CURRENT MEDICATIONS:  1. Aspirin 81 mg p.o. daily.  2. Atorvastatin 40 mg p.o. daily.  3. Dexlansoprazole 60 mg p.o. q.a.m.  4. Metoprolol 12.5 mg p.o. b.i.d.  5. Nitrostat 0.4 mg sublingual p.r.n.  PAST MEDICAL HISTORY: He has history of erosive reflux esophagitis. He  had EGD by me in October 2004, revealing erosive esophagitis and small  sliding hiatal hernia. He was treated at that time with dietary  measures and PPI, but he has been off therapy for a long time. Fatty  liver based on ultrasonography also 2004, his transaminases always been  normal.  He suffered an MI in August 2003, it was mild. He had 4-vessel coronary  artery bypass graft in May 2006 and has done well. Recent cardiac cath  results are noted under history of present illness. He has good LV  function. All grafts are patent. He has been on therapy for  hyperlipidemia for 13 years.  He had tonsillectomy at age 35. He had screening colonoscopy by Dr.  Lovell Sheehan, in August 2006, and was normal. He has been hypertensive for  about 6 months. He has left hydrocele.  Obesity. His BMI is 336.39.  ALLERGIES: NK.  FAMILY HISTORY: Mother died of cervical carcinoma  at age 42 after she  received the first dose of radiation therapy, details not available.  Father had throat cancer and died at 48. He apparently lived couple of  years after the diagnosis was made. The patient does not have any  siblings.  SOCIAL HISTORY: He is divorced. He has 3 children in good health. He  has been working at [employer] for the last 17 years. He has never  smoked cigarettes or drank alcohol.  PHYSICAL EXAMINATION: VITAL SIGNS: Weight 246.4 pounds, he is 69  inches tall, pulse 74 per minute, blood pressure 130/76, temp is 97.9,    respirations 20.  HEENT: Conjunctivae is pink. Sclerae nonicteric. Oropharyngeal mucosa  is normal. No neck masses or thyromegaly noted.  CARDIAC: With regular rhythm. Normal S1 and S2. No murmur or gallop  noted.  LUNGS: Clear to auscultation.  ABDOMEN: Full. Bowel sounds are normal. On palpation, soft abdomen  without tenderness, organomegaly, or masses.  RECTAL: Deferred.  EXTREMITIES: No peripheral edema or clubbing noted.  LABORATORY DATA: From May 03, 2011, WBC 8.1, H and H 14.7 and 41.5,  platelet count 233,000. Electrolytes within normal limits. Glucose  109, BUN 11, creatinine 0.96, calcium 9.7, total bilirubin 0.5, AP 74,  AST 19, ALT 24, total protein 7.6 with albumin of 4.3.  Ultrasound and HIDA scan reviewed.  ASSESSMENT:  1. [name] is 56 year old Caucasian male with known coronary artery  disease status post four-vessel CABG in May 2006, who was recently  hospitalized with prolonged chest pain and noted to have mildly  elevated troponin levels, but cardiac cath reveals normal LV  function and patent grafts and therefore his pain was felt to be  noncardiac. Workup for gallbladder disease has been negative. He  has not experienced any more episodes of pain since he has been on  Dexilant. He does have well-documented history of erosive reflux  esophagitis. Even though, his symptoms are not typical it is quite  possible that his atypical chest pain is secondary to  gastroesophageal reflux disease. Since, he is entirely  asymptomatic, I would recommend considering diagnostic EGD should  he experience another episode of chest pain.  2. Fatty liver. His transaminases are normal. He must exercise more  regularly than he is doing and he should make every effort to try  to get his weight down to at least 220 pounds. If he remains  active, I doubt that his fatty liver, would ever become a  clinically issue.  RECOMMENDATIONS:  1. The patient reassured. Should he experience  another episode of  chest pain, he will contact us to schedule EGD.  2. New prescription given for Dexilant 60 mg p.o. q.a.m., with  multiple refills.  3. He will return for office visit in 1 year at which time, I may  consider decreasing dose of his PPI.  We appreciate the opportunity to participate in the care of this  gentleman.

## 2011-08-05 ENCOUNTER — Encounter: Payer: Self-pay | Admitting: Cardiovascular Disease

## 2011-08-05 ENCOUNTER — Ambulatory Visit (INDEPENDENT_AMBULATORY_CARE_PROVIDER_SITE_OTHER): Payer: 59 | Admitting: Cardiovascular Disease

## 2011-08-05 ENCOUNTER — Encounter: Payer: 59 | Admitting: Cardiovascular Disease

## 2011-08-05 VITALS — BP 119/69 | HR 56 | Ht 69.0 in | Wt 246.0 lb

## 2011-08-05 DIAGNOSIS — I10 Essential (primary) hypertension: Secondary | ICD-10-CM

## 2011-08-05 DIAGNOSIS — I2510 Atherosclerotic heart disease of native coronary artery without angina pectoris: Secondary | ICD-10-CM

## 2011-08-05 DIAGNOSIS — E785 Hyperlipidemia, unspecified: Secondary | ICD-10-CM

## 2011-08-05 DIAGNOSIS — Z79899 Other long term (current) drug therapy: Secondary | ICD-10-CM

## 2011-08-05 NOTE — Assessment & Plan Note (Signed)
Stable with no angina and good activity level.  Continue medical Rx Grafts patient by recent cath 2/13

## 2011-08-05 NOTE — Patient Instructions (Addendum)
Your physician wants you to follow-up in:   YEAR WITH DR Haywood Filler will receive a reminder letter in the mail two months in advance. If you don't receive a letter, please call our office to schedule the follow-up appointment. Your physician recommends that you continue on your current medications as directed. Please refer to the Current Medication list given to you today. Your physician recommends that you return for lab work in: FASTING LIPID LIVER IN  3 MONTHS

## 2011-08-05 NOTE — Progress Notes (Signed)
Addended by: Scherrie Bateman E on: 08/05/2011 04:58 PM   Modules accepted: Orders

## 2011-08-05 NOTE — Assessment & Plan Note (Signed)
F/U labs in 6 months would like to get LDL under 100 with history of CABG

## 2011-08-05 NOTE — Assessment & Plan Note (Signed)
Well controlled.  Continue current medications and low sodium Dash type diet.    

## 2011-08-05 NOTE — Progress Notes (Signed)
Patient ID: [name], male   DOB: 1955/09/30, 56 y.o.   MRN: [id number] Returns for F/U of chest pain He has a history of CAD, status post CABG in 2006 (LIMA-LAD, RIMA-acute marginal, SVG-DX, SVG-RCA), hypertension, hyperlipidemia.  Last ETT-Myoview 7/11: EF 63%, no ischemia. He presented to [facility] 2/13  with chest pain occurring suddenly. Initial EKG was normal. He was admitted for observation. His troponins returned positive and he is transferred to [facility] for further evaluation and treatment.    Cath done 05/03/11   Coronary Arteries:  Right dominant with no anomalies  LM:50% diffuse proximal   LAD: 100% at take off of septal perforator  Circumflex: 100% mid  RCA: 100% proximal  Grafts:  SVG- OM normal  SVG- RCA 30% mid body. RCA small artery with 30% distal disease  Free RIMA:- Diagonal 30% mid body LIMA: to LAD normal with 30% disease in distal LAD  Ventriculography: EF: 55 %, no discrete RWMA;s  Hemodynamics:  Aortic Pressure: 151/76 mmHg LV Pressure: 157/10 mmHg  Impression: No areas of potential ischemia. Patent grafts and good LV function. Pain would appear to be non-cardiac. Given late hour and small hematoma while  Sheath in as well as patient obesity will keep overnight. Plan D/C in am if Hct stable  Since D/C no pain.  Had EGD in [place] and no lesions Still on Dexilant.  Needs F/U lipids in 6 months.  Last LDL 2/13 133 while on low dose crestor and now on 40 mg Of lipitor  ROS: Denies fever, malais, weight loss, blurry vision, decreased visual acuity, cough, sputum, SOB, hemoptysis, pleuritic pain, palpitaitons, heartburn, abdominal pain, melena, lower extremity edema, claudication, or rash.  All other systems reviewed and negative  General: Affect appropriate Healthy:  appears stated age HEENT: normal Neck supple with no adenopathy JVP normal no bruits no thyromegaly Lungs clear with no wheezing and good diaphragmatic motion Heart:  S1/S2 no  murmur, no rub, gallop or click PMI normal Abdomen: benighn, BS positve, no tenderness, no AAA no bruit.  No HSM or HJR Distal pulses intact with no bruits No edema Neuro non-focal Skin warm and dry No muscular weakness   Current Outpatient Prescriptions  Medication Sig Dispense Refill  . aspirin EC 81 MG tablet Take 81 mg by mouth daily.      Marland Kitchen atorvastatin (LIPITOR) 40 MG tablet Take 1 tablet (40 mg total) by mouth at bedtime.  30 tablet  6  . dexlansoprazole (DEXILANT) 60 MG capsule Take 1 capsule (60 mg total) by mouth daily.  30 capsule  11  . metoprolol tartrate (LOPRESSOR) 25 MG tablet Take 0.5 tablets (12.5 mg total) by mouth 2 (two) times daily.  60 tablet  6  . nitroGLYCERIN (NITROSTAT) 0.4 MG SL tablet Place 1 tablet (0.4 mg total) under the tongue every 5 (five) minutes x 3 doses as needed for chest pain.  25 tablet  4    Allergies  Review of patient's allergies indicates no known allergies.  Electrocardiogram: 05/05/11  NSR RSR' rate 70 no acute changes  Assessment and Plan

## 2012-03-24 DIAGNOSIS — I219 Acute myocardial infarction, unspecified: Secondary | ICD-10-CM

## 2012-03-24 HISTORY — DX: Acute myocardial infarction, unspecified: I21.9

## 2012-05-05 NOTE — Patient Instructions (Addendum)
Your procedure is scheduled on: 05/17/2012  Report to [facility] at   830    AM.  Call this number if you have problems the morning of surgery: [phone]   Do not eat food or drink liquids :After Midnight.      Take these medicines the morning of surgery with A SIP OF WATER:dexilant,metoprolol   Do not wear jewelry, make-up or nail polish.  Do not wear lotions, powders, or perfumes.   Do not shave 48 hours prior to surgery.  Do not bring valuables to the hospital.  Contacts, dentures or bridgework may not be worn into surgery.  Leave suitcase in the car. After surgery it may be brought to your room.  For patients admitted to the hospital, checkout time is 11:00 AM the day of discharge.   Patients discharged the day of surgery will not be allowed to drive home.  :     Please read over the following fact sheets that you were given: Coughing and Deep Breathing, Surgical Site Infection Prevention, Anesthesia Post-op Instructions and Care and Recovery After Surgery    Cataract A cataract is a clouding of the lens of the eye. When a lens becomes cloudy, vision is reduced based on the degree and nature of the clouding. Many cataracts reduce vision to some degree. Some cataracts make people more near-sighted as they develop. Other cataracts increase glare. Cataracts that are ignored and become worse can sometimes look white. The white color can be seen through the pupil. CAUSES   Aging. However, cataracts may occur at any age, even in newborns.   Certain drugs.   Trauma to the eye.   Certain diseases such as diabetes.   Specific eye diseases such as chronic inflammation inside the eye or a sudden attack of a rare form of glaucoma.   Inherited or acquired medical problems.  SYMPTOMS   Gradual, progressive drop in vision in the affected eye.   Severe, rapid visual loss. This most often happens when trauma is the cause.  DIAGNOSIS  To detect a cataract, an eye doctor examines the  lens. Cataracts are best diagnosed with an exam of the eyes with the pupils enlarged (dilated) by drops.  TREATMENT  For an early cataract, vision may improve by using different eyeglasses or stronger lighting. If that does not help your vision, surgery is the only effective treatment. A cataract needs to be surgically removed when vision loss interferes with your everyday activities, such as driving, reading, or watching TV. A cataract may also have to be removed if it prevents examination or treatment of another eye problem. Surgery removes the cloudy lens and usually replaces it with a substitute lens (intraocular lens, IOL).  At a time when both you and your doctor agree, the cataract will be surgically removed. If you have cataracts in both eyes, only one is usually removed at a time. This allows the operated eye to heal and be out of danger from any possible problems after surgery (such as infection or poor wound healing). In rare cases, a cataract may be doing damage to your eye. In these cases, your caregiver may advise surgical removal right away. The vast majority of people who have cataract surgery have better vision afterward. HOME CARE INSTRUCTIONS  If you are not planning surgery, you may be asked to do the following:  Use different eyeglasses.   Use stronger or brighter lighting.   Ask your eye doctor about reducing your medicine dose or changing  medicines if it is thought that a medicine caused your cataract. Changing medicines does not make the cataract go away on its own.   Become familiar with your surroundings. Poor vision can lead to injury. Avoid bumping into things on the affected side. You are at a higher risk for tripping or falling.   Exercise extreme care when driving or operating machinery.   Wear sunglasses if you are sensitive to bright light or experiencing problems with glare.  SEEK IMMEDIATE MEDICAL CARE IF:   You have a worsening or sudden vision loss.   You  notice redness, swelling, or increasing pain in the eye.   You have a fever.  Document Released: 03/10/2005 Document Revised: 02/27/2011 Document Reviewed: 11/01/2010 [facility] Patient Information 2012 [email or web address].PATIENT INSTRUCTIONS POST-ANESTHESIA  IMMEDIATELY FOLLOWING SURGERY:  Do not drive or operate machinery for the first twenty four hours after surgery.  Do not make any important decisions for twenty four hours after surgery or while taking narcotic pain medications or sedatives.  If you develop intractable nausea and vomiting or a severe headache please notify your doctor immediately.  FOLLOW-UP:  Please make an appointment with your surgeon as instructed. You do not need to follow up with anesthesia unless specifically instructed to do so.  WOUND CARE INSTRUCTIONS (if applicable):  Keep a dry clean dressing on the anesthesia/puncture wound site if there is drainage.  Once the wound has quit draining you may leave it open to air.  Generally you should leave the bandage intact for twenty four hours unless there is drainage.  If the epidural site drains for more than 36-48 hours please call the anesthesia department.  QUESTIONS?:  Please feel free to call your physician or the hospital operator if you have any questions, and they will be happy to assist you.

## 2012-05-06 ENCOUNTER — Encounter (HOSPITAL_COMMUNITY): Payer: Self-pay | Admitting: Pharmacy Technician

## 2012-05-07 ENCOUNTER — Encounter (HOSPITAL_COMMUNITY)
Admission: RE | Admit: 2012-05-07 | Discharge: 2012-05-07 | Disposition: A | Discharge status: Home / Self Care | Payer: 59 | Source: Ambulatory Visit | Attending: Ophthalmology | Admitting: Ophthalmology

## 2012-05-07 ENCOUNTER — Encounter (HOSPITAL_COMMUNITY): Payer: Self-pay

## 2012-05-07 HISTORY — DX: Gastro-esophageal reflux disease without esophagitis: K21.9

## 2012-05-07 HISTORY — DX: Acute myocardial infarction, unspecified: I21.9

## 2012-05-07 HISTORY — DX: Essential (primary) hypertension: I10

## 2012-05-07 LAB — BASIC METABOLIC PANEL
BUN: 18 mg/dL (ref 6–23)
Creatinine, Ser: 1.24 mg/dL (ref 0.50–1.35)
GFR calc Af Amer: 73 mL/min — ABNORMAL LOW (ref >90)
GFR calc non Af Amer: 63 mL/min — ABNORMAL LOW (ref >90)

## 2012-05-07 LAB — HEMOGLOBIN AND HEMATOCRIT, BLOOD
HCT: 40.3 % (ref 39.0–52.0)
Hemoglobin: 14.2 g/dL (ref 13.0–17.0)

## 2012-05-07 NOTE — Progress Notes (Signed)
05/07/12 1517  OBSTRUCTIVE SLEEP APNEA  Have you ever been diagnosed with sleep apnea through a sleep study? No  If yes, do you have and use a CPAP or BPAP machine every night? 0  Do you snore loudly (loud enough to be heard through closed doors)?  1  Do you often feel tired, fatigued, or sleepy during the daytime? 1  Has anyone observed you stop breathing during your sleep? 1  Do you have, or are you being treated for high blood pressure? 1  BMI more than 35 kg/m2? 1  Age over 41 years old? 1  Neck circumference greater than 40 cm/18 inches? 0  Gender: 1  Obstructive Sleep Apnea Score 7

## 2012-05-17 ENCOUNTER — Ambulatory Visit (HOSPITAL_COMMUNITY)
Admission: RE | Admit: 2012-05-17 | Discharge: 2012-05-17 | Disposition: A | Discharge status: Home / Self Care | Payer: 59 | Source: Ambulatory Visit | Attending: Ophthalmology | Admitting: Ophthalmology

## 2012-05-17 ENCOUNTER — Encounter (HOSPITAL_COMMUNITY)
Admission: RE | Disposition: A | Discharge status: Home / Self Care | Payer: Self-pay | Source: Ambulatory Visit | Attending: Ophthalmology

## 2012-05-17 ENCOUNTER — Encounter (HOSPITAL_COMMUNITY): Payer: Self-pay | Admitting: Anesthesiology

## 2012-05-17 ENCOUNTER — Ambulatory Visit (HOSPITAL_COMMUNITY): Payer: 59 | Admitting: Anesthesiology

## 2012-05-17 ENCOUNTER — Encounter (HOSPITAL_COMMUNITY): Payer: Self-pay | Admitting: *Deleted

## 2012-05-17 DIAGNOSIS — H2510 Age-related nuclear cataract, unspecified eye: Secondary | ICD-10-CM | POA: Insufficient documentation

## 2012-05-17 DIAGNOSIS — H26109 Unspecified traumatic cataract, unspecified eye: Secondary | ICD-10-CM | POA: Insufficient documentation

## 2012-05-17 DIAGNOSIS — Z01810 Encounter for preprocedural cardiovascular examination: Secondary | ICD-10-CM | POA: Insufficient documentation

## 2012-05-17 DIAGNOSIS — Z01812 Encounter for preprocedural laboratory examination: Secondary | ICD-10-CM | POA: Insufficient documentation

## 2012-05-17 DIAGNOSIS — I10 Essential (primary) hypertension: Secondary | ICD-10-CM | POA: Insufficient documentation

## 2012-05-17 HISTORY — PX: CATARACT EXTRACTION W/PHACO: SHX586

## 2012-05-17 SURGERY — PHACOEMULSIFICATION, CATARACT, WITH IOL INSERTION
Anesthesia: Monitor Anesthesia Care | Site: Eye | Laterality: Left | Wound class: Clean

## 2012-05-17 MED ORDER — MIDAZOLAM HCL 2 MG/2ML IJ SOLN
1.0000 mg | INTRAMUSCULAR | Status: DC | PRN
  Administered 2012-05-17 (×2): 2 mg via INTRAVENOUS

## 2012-05-17 MED ORDER — MIDAZOLAM HCL 2 MG/2ML IJ SOLN
INTRAMUSCULAR | Status: AC
  Filled 2012-05-17: qty 2

## 2012-05-17 MED ORDER — NEOMYCIN-POLYMYXIN-DEXAMETH 0.1 % OP OINT
TOPICAL_OINTMENT | OPHTHALMIC | Status: DC | PRN
  Administered 2012-05-17: 1 via OPHTHALMIC

## 2012-05-17 MED ORDER — LIDOCAINE 3.5 % OP GEL OPTIME - NO CHARGE
OPHTHALMIC | Status: DC | PRN
  Administered 2012-05-17: 2 [drp] via OPHTHALMIC

## 2012-05-17 MED ORDER — PROVISC 10 MG/ML IO SOLN
INTRAOCULAR | Status: DC | PRN
  Administered 2012-05-17: 8.5 mg via INTRAOCULAR

## 2012-05-17 MED ORDER — LACTATED RINGERS IV SOLN
INTRAVENOUS | Status: DC
  Administered 2012-05-17: 1000 mL via INTRAVENOUS

## 2012-05-17 MED ORDER — POVIDONE-IODINE 5 % OP SOLN
OPHTHALMIC | Status: DC | PRN
  Administered 2012-05-17: 1 via OPHTHALMIC

## 2012-05-17 MED ORDER — PHENYLEPHRINE HCL 2.5 % OP SOLN
1.0000 [drp] | OPHTHALMIC | Status: AC
  Administered 2012-05-17 (×3): 1 [drp] via OPHTHALMIC

## 2012-05-17 MED ORDER — BSS IO SOLN
INTRAOCULAR | Status: DC | PRN
  Administered 2012-05-17: 09:00:00

## 2012-05-17 MED ORDER — LIDOCAINE HCL (PF) 1 % IJ SOLN
INTRAMUSCULAR | Status: DC | PRN
  Administered 2012-05-17: .4 mL

## 2012-05-17 MED ORDER — CYCLOPENTOLATE-PHENYLEPHRINE 0.2-1 % OP SOLN
1.0000 [drp] | OPHTHALMIC | Status: AC
  Administered 2012-05-17 (×3): 1 [drp] via OPHTHALMIC

## 2012-05-17 MED ORDER — LIDOCAINE HCL 3.5 % OP GEL
1.0000 "application " | Freq: Once | OPHTHALMIC | Status: AC
  Administered 2012-05-17: 1 via OPHTHALMIC

## 2012-05-17 MED ORDER — BSS IO SOLN
INTRAOCULAR | Status: DC | PRN
  Administered 2012-05-17: 15 mL via INTRAOCULAR

## 2012-05-17 MED ORDER — TETRACAINE HCL 0.5 % OP SOLN
1.0000 [drp] | OPHTHALMIC | Status: AC
  Administered 2012-05-17 (×3): 1 [drp] via OPHTHALMIC

## 2012-05-17 SURGICAL SUPPLY — 31 items
CAPSULAR TENSION RING-AMO (OPHTHALMIC RELATED) IMPLANT
CLOTH BEACON ORANGE TIMEOUT ST (SAFETY) ×1 IMPLANT
EYE SHIELD UNIVERSAL CLEAR (GAUZE/BANDAGES/DRESSINGS) ×1 IMPLANT
GLOVE BIO SURGEON STRL SZ 6.5 (GLOVE) IMPLANT
GLOVE BIOGEL PI IND STRL 6.5 (GLOVE) IMPLANT
GLOVE BIOGEL PI IND STRL 7.0 (GLOVE) IMPLANT
GLOVE BIOGEL PI IND STRL 7.5 (GLOVE) IMPLANT
GLOVE BIOGEL PI INDICATOR 6.5 (GLOVE) ×1
GLOVE BIOGEL PI INDICATOR 7.0 (GLOVE)
GLOVE BIOGEL PI INDICATOR 7.5 (GLOVE)
GLOVE ECLIPSE 6.5 STRL STRAW (GLOVE) IMPLANT
GLOVE ECLIPSE 7.0 STRL STRAW (GLOVE) IMPLANT
GLOVE ECLIPSE 7.5 STRL STRAW (GLOVE) IMPLANT
GLOVE EXAM NITRILE LRG STRL (GLOVE) IMPLANT
GLOVE EXAM NITRILE MD LF STRL (GLOVE) ×1 IMPLANT
GLOVE SKINSENSE NS SZ6.5 (GLOVE)
GLOVE SKINSENSE NS SZ7.0 (GLOVE)
GLOVE SKINSENSE STRL SZ6.5 (GLOVE) IMPLANT
GLOVE SKINSENSE STRL SZ7.0 (GLOVE) IMPLANT
KIT VITRECTOMY (OPHTHALMIC RELATED) IMPLANT
PAD ARMBOARD 7.5X6 YLW CONV (MISCELLANEOUS) ×1 IMPLANT
PROC W NO LENS (INTRAOCULAR LENS)
PROC W SPEC LENS (INTRAOCULAR LENS)
PROCESS W NO LENS (INTRAOCULAR LENS) IMPLANT
PROCESS W SPEC LENS (INTRAOCULAR LENS) IMPLANT
RING MALYGIN (MISCELLANEOUS) IMPLANT
SIGHTPATH CAT PROC W REG LENS (Ophthalmic Related) ×2 IMPLANT
SYR TB 1ML LL NO SAFETY (SYRINGE) ×1 IMPLANT
TAPE CLOTH SOFT 2X10 (GAUZE/BANDAGES/DRESSINGS) ×1 IMPLANT
VISCOELASTIC ADDITIONAL (OPHTHALMIC RELATED) IMPLANT
WATER STERILE IRR 250ML POUR (IV SOLUTION) ×1 IMPLANT

## 2012-05-17 NOTE — Anesthesia Preprocedure Evaluation (Signed)
Anesthesia Evaluation  Patient identified by MRN, date of birth, ID band Patient awake    Reviewed: Allergy & Precautions, H&P , NPO status , Patient's Chart, lab work & pertinent test results, reviewed documented beta blocker date and time   Airway Mallampati: I TM Distance: >3 FB     Dental  (+) Teeth Intact   Pulmonary  breath sounds clear to auscultation        Cardiovascular hypertension, Pt. on medications + CAD and + Past MI Rhythm:Regular Rate:Normal     Neuro/Psych    GI/Hepatic GERD-  Medicated,  Endo/Other    Renal/GU      Musculoskeletal   Abdominal   Peds  Hematology   Anesthesia Other Findings   Reproductive/Obstetrics                           Anesthesia Physical Anesthesia Plan  ASA: III  Anesthesia Plan: MAC   Post-op Pain Management:    Induction: Intravenous  Airway Management Planned: Nasal Cannula  Additional Equipment:   Intra-op Plan:   Post-operative Plan:   Informed Consent: I have reviewed the patients History and Physical, chart, labs and discussed the procedure including the risks, benefits and alternatives for the proposed anesthesia with the patient or authorized representative who has indicated his/her understanding and acceptance.     Plan Discussed with:   Anesthesia Plan Comments:         Anesthesia Quick Evaluation

## 2012-05-17 NOTE — H&P (Signed)
I have reviewed the H&P, the patient was re-examined, and I have identified no interval changes in medical condition and plan of care since the history and physical of record  

## 2012-05-17 NOTE — Anesthesia Postprocedure Evaluation (Signed)
  Anesthesia Post-op Note  Patient: [name]  Procedure(s) Performed: Procedure(s) (LRB): CATARACT EXTRACTION PHACO AND INTRAOCULAR LENS PLACEMENT (IOC) (Left)  Patient Location:  Short Stay  Anesthesia Type: MAC  Level of Consciousness: awake  Airway and Oxygen Therapy: Patient Spontanous Breathing  Post-op Pain: none  Post-op Assessment: Post-op Vital signs reviewed, Patient's Cardiovascular Status Stable, Respiratory Function Stable, Patent Airway, No signs of Nausea or vomiting and Pain level controlled  Post-op Vital Signs: Reviewed and stable  Complications: No apparent anesthesia complications

## 2012-05-17 NOTE — Op Note (Signed)
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  POSTOPERATIVE DIAGNOSIS:  Combined cataract, left eye, diagnosis code 366.19.  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  ANESTHESIA:  Topical with monitored anesthesia care.  PROCEDURE:  Phacoemulsification with posterior chamber intraocular lens implantation, left eye.  OPERATIVE SUMMARY:  In the preoperative area, dilating drops were placed into the left eye.  The patient was then brought into the operating room where he was placed under general anesthesia.  The eye was then prepped and draped.  Beginning with a 75 blade, a paracentesis port was made at the surgeon's 2 o'clock position.  The anterior chamber was then filled with a 1% nonpreserved lidocaine solution with epinephrine.  This was followed by Viscoat to deepen the chamber.  A small fornix-based peritomy was performed superiorly.  Next, a single iris hook was placed through the limbus superiorly.  A 2.4-mm keratome blade was then used to make a clear corneal incision over the iris hook.  A bent cystotome needle and Utrata forceps were used to create a continuous tear capsulotomy.  Hydrodissection was performed using balanced salt solution on a fine cannula.  The lens nucleus was then removed using phacoemulsification in a quadrant cracking technique.  The cortical material was then removed with irrigation and aspiration.  The capsular bag and anterior chamber were refilled with Provisc.  The wound was widened to approximately 3 mm and a posterior chamber intraocular lens was placed into  the capsular bag without difficulty using an [employer] lens injecting system.  A single 10-0 nylon suture was then used to close the incision as well as stromal hydration.  The Provisc was removed from the anterior chamber and capsular bag with irrigation and aspiration.  At this point, the wounds were tested for leak, which were negative.  The anterior chamber remained deep and stable.  The patient tolerated the procedure well.  There were no operative complications, and he awoke from general anesthesia without problem.  No surgical specimens.  Prosthetic device used Bausch and Lomb posterior chamber lens, model EnVista, model number MX60, power of 20.0, serial number is [id number].          ______________________________ Susanne Greenhouse, MD     KEH/MEDQ  D:  05/17/2012  T:  05/17/2012  Job:  [id number]

## 2012-05-17 NOTE — Anesthesia Procedure Notes (Signed)
Procedure Name: MAC Date/Time: 05/17/2012 9:20 AM Performed by: Franco Nones Pre-anesthesia Checklist: Patient identified, Emergency Drugs available, Suction available, Timeout performed and Patient being monitored Patient Re-evaluated:Patient Re-evaluated prior to inductionOxygen Delivery Method: Nasal Cannula

## 2012-05-17 NOTE — Transfer of Care (Signed)
Immediate Anesthesia Transfer of Care Note  Patient: [name]  Procedure(s) Performed: Procedure(s) (LRB): CATARACT EXTRACTION PHACO AND INTRAOCULAR LENS PLACEMENT (IOC) (Left)  Patient Location: Shortstay  Anesthesia Type: MAC  Level of Consciousness: awake  Airway & Oxygen Therapy: Patient Spontanous Breathing   Post-op Assessment: Report given to PACU RN, Post -op Vital signs reviewed and stable and Patient moving all extremities  Post vital signs: Reviewed and stable  Complications: No apparent anesthesia complications

## 2012-05-17 NOTE — Brief Op Note (Signed)
Pre-Op Dx: Cataract OS Post-Op Dx: Cataract OS Surgeon: Diara Chaudhari Anesthesia: Topical with MAC Surgery: Cataract Extraction with Intraocular lens Implant OS Implant: B&L enVista Specimen: None Complications: None 

## 2012-05-18 ENCOUNTER — Encounter (HOSPITAL_COMMUNITY): Payer: Self-pay | Admitting: Ophthalmology

## 2012-06-24 ENCOUNTER — Encounter (INDEPENDENT_AMBULATORY_CARE_PROVIDER_SITE_OTHER): Payer: Self-pay | Admitting: *Deleted

## 2012-07-06 ENCOUNTER — Ambulatory Visit (INDEPENDENT_AMBULATORY_CARE_PROVIDER_SITE_OTHER): Payer: 59 | Admitting: Internal Medicine

## 2012-07-12 ENCOUNTER — Ambulatory Visit (INDEPENDENT_AMBULATORY_CARE_PROVIDER_SITE_OTHER): Payer: 59 | Admitting: Internal Medicine

## 2012-07-12 ENCOUNTER — Encounter (INDEPENDENT_AMBULATORY_CARE_PROVIDER_SITE_OTHER): Payer: Self-pay | Admitting: Internal Medicine

## 2012-07-12 VITALS — BP 132/86 | HR 68 | Ht 69.0 in | Wt 240.9 lb

## 2012-07-12 DIAGNOSIS — K219 Gastro-esophageal reflux disease without esophagitis: Secondary | ICD-10-CM | POA: Insufficient documentation

## 2012-07-12 NOTE — Patient Instructions (Addendum)
Dexilant every other day. OV in 1 yrs.

## 2012-07-12 NOTE — Progress Notes (Signed)
Subjective:     Patient ID: [name], male   DOB: 1955/11/03, 57 y.o.   MRN: [id number]  HPI Here today for one year follow up. Last seen in April of last year for atypical chest pain. Cardiac workup was negative in him with a known history of CAD.  Hs of CAD and has had a prior CABG.  February 2013 cardiac cath. All of his grafts were patent and he had good LV fufnction.  Felt his pain was non-cardiac.  He was started on Dexilant.     Says he his doing okay. He does have some epigastric tenderness. He will burp frequently if he eats peanut butter. His appetite is okay.  No weight loss.  If he stops the Dexilant he will have problems with GERD. No melena or bright red rectal bleeding. Review of Systems Current Outpatient Prescriptions  Medication Sig Dispense Refill  . aspirin EC 81 MG tablet Take 81 mg by mouth daily.      Marland Kitchen atorvastatin (LIPITOR) 40 MG tablet Take 40 mg by mouth daily.      Marland Kitchen dexlansoprazole (DEXILANT) 60 MG capsule Take 60 mg by mouth as needed.      . metoprolol tartrate (LOPRESSOR) 25 MG tablet Take 0.5 tablets (12.5 mg total) by mouth 2 (two) times daily.  60 tablet  6  . nitroGLYCERIN (NITROSTAT) 0.4 MG SL tablet Place 0.4 mg under the tongue every 5 (five) minutes as needed for chest pain.      Marland Kitchen atorvastatin (LIPITOR) 40 MG tablet Take 1 tablet (40 mg total) by mouth at bedtime.  30 tablet  6  . nitroGLYCERIN (NITROSTAT) 0.4 MG SL tablet Place 1 tablet (0.4 mg total) under the tongue every 5 (five) minutes x 3 doses as needed for chest pain.  25 tablet  4   No current facility-administered medications for this visit.   Past Medical History  Diagnosis Date  . CAD (coronary artery disease) 2000    History of MI. S/p Angioplasty 2001; s/p CABG x4 2006. Patent grafts by cath 04/2011 with normal EF  . History of hypertension   . Hyperlipidemia   . Obesity   . Hydrocele 2012  . Myocardial infarction 2000  . Hypertension   . GERD (gastroesophageal reflux  disease)    Past Surgical History  Procedure Laterality Date  . Coronary artery bypass grafting  May 2006    Four-vessel  . Pilonidal cyst excision    . Tonsillectomy    . Coronary angioplasty    . Foot fracture surgery      right  . Cataract extraction w/phaco Left 05/17/2012    Procedure: CATARACT EXTRACTION PHACO AND INTRAOCULAR LENS PLACEMENT (IOC);  Surgeon: Gemma Payor, MD;  Location: [facility];  Service: Ophthalmology;  Laterality: Left;  CDE:  0.76   No Known Allergies      Objective:   Physical Exam  Filed Vitals:   07/12/12 1116  BP: 132/86  Pulse: 68  Height: 5\' 9"  (1.753 m)  Weight: 240 lb 14.4 oz (109.272 kg)   Alert and oriented. Skin warm and dry. Oral mucosa is moist.   . Sclera anicteric, conjunctivae is pink. Thyroid not enlarged. No cervical lymphadenopathy. Lungs clear. Heart regular rate and rhythm.  Abdomen is soft. Bowel sounds are positive. No hepatomegaly. No abdominal masses felt. No tenderness.  No edema to lower extremities.       Assessment:    GERD controlled for the most part at this time.  Taking Dexilant for his GERD.    Plan:    Try taking Dexilant every other day. OV one year.

## 2012-07-13 ENCOUNTER — Ambulatory Visit (INDEPENDENT_AMBULATORY_CARE_PROVIDER_SITE_OTHER): Payer: 59 | Admitting: Internal Medicine

## 2012-08-18 ENCOUNTER — Encounter (HOSPITAL_COMMUNITY): Payer: Self-pay | Admitting: Pharmacy Technician

## 2012-08-18 NOTE — Patient Instructions (Addendum)
Your procedure is scheduled on: 08/26/2012  Report to [facility] at  1120   AM.  Call this number if you have problems the morning of surgery: [phone]   Do not eat food or drink liquids :After Midnight.      Take these medicines the morning of surgery with A SIP OF WATER: dexilant,lopressor   Do not wear jewelry, make-up or nail polish.  Do not wear lotions, powders, or perfumes.   Do not shave 48 hours prior to surgery.  Do not bring valuables to the hospital.  Contacts, dentures or bridgework may not be worn into surgery.  Leave suitcase in the car. After surgery it may be brought to your room.  For patients admitted to the hospital, checkout time is 11:00 AM the day of discharge.   Patients discharged the day of surgery will not be allowed to drive home.  :     Please read over the following fact sheets that you were given: Coughing and Deep Breathing, Surgical Site Infection Prevention, Anesthesia Post-op Instructions and Care and Recovery After Surgery    Cataract A cataract is a clouding of the lens of the eye. When a lens becomes cloudy, vision is reduced based on the degree and nature of the clouding. Many cataracts reduce vision to some degree. Some cataracts make people more near-sighted as they develop. Other cataracts increase glare. Cataracts that are ignored and become worse can sometimes look white. The white color can be seen through the pupil. CAUSES   Aging. However, cataracts may occur at any age, even in newborns.   Certain drugs.   Trauma to the eye.   Certain diseases such as diabetes.   Specific eye diseases such as chronic inflammation inside the eye or a sudden attack of a rare form of glaucoma.   Inherited or acquired medical problems.  SYMPTOMS   Gradual, progressive drop in vision in the affected eye.   Severe, rapid visual loss. This most often happens when trauma is the cause.  DIAGNOSIS  To detect a cataract, an eye doctor examines the lens.  Cataracts are best diagnosed with an exam of the eyes with the pupils enlarged (dilated) by drops.  TREATMENT  For an early cataract, vision may improve by using different eyeglasses or stronger lighting. If that does not help your vision, surgery is the only effective treatment. A cataract needs to be surgically removed when vision loss interferes with your everyday activities, such as driving, reading, or watching TV. A cataract may also have to be removed if it prevents examination or treatment of another eye problem. Surgery removes the cloudy lens and usually replaces it with a substitute lens (intraocular lens, IOL).  At a time when both you and your doctor agree, the cataract will be surgically removed. If you have cataracts in both eyes, only one is usually removed at a time. This allows the operated eye to heal and be out of danger from any possible problems after surgery (such as infection or poor wound healing). In rare cases, a cataract may be doing damage to your eye. In these cases, your caregiver may advise surgical removal right away. The vast majority of people who have cataract surgery have better vision afterward. HOME CARE INSTRUCTIONS  If you are not planning surgery, you may be asked to do the following:  Use different eyeglasses.   Use stronger or brighter lighting.   Ask your eye doctor about reducing your medicine dose or changing medicines  if it is thought that a medicine caused your cataract. Changing medicines does not make the cataract go away on its own.   Become familiar with your surroundings. Poor vision can lead to injury. Avoid bumping into things on the affected side. You are at a higher risk for tripping or falling.   Exercise extreme care when driving or operating machinery.   Wear sunglasses if you are sensitive to bright light or experiencing problems with glare.  SEEK IMMEDIATE MEDICAL CARE IF:   You have a worsening or sudden vision loss.   You notice  redness, swelling, or increasing pain in the eye.   You have a fever.  Document Released: 03/10/2005 Document Revised: 02/27/2011 Document Reviewed: 11/01/2010 [facility] Patient Information 2012 [place].PATIENT INSTRUCTIONS POST-ANESTHESIA  IMMEDIATELY FOLLOWING SURGERY:  Do not drive or operate machinery for the first twenty four hours after surgery.  Do not make any important decisions for twenty four hours after surgery or while taking narcotic pain medications or sedatives.  If you develop intractable nausea and vomiting or a severe headache please notify your doctor immediately.  FOLLOW-UP:  Please make an appointment with your surgeon as instructed. You do not need to follow up with anesthesia unless specifically instructed to do so.  WOUND CARE INSTRUCTIONS (if applicable):  Keep a dry clean dressing on the anesthesia/puncture wound site if there is drainage.  Once the wound has quit draining you may leave it open to air.  Generally you should leave the bandage intact for twenty four hours unless there is drainage.  If the epidural site drains for more than 36-48 hours please call the anesthesia department.  QUESTIONS?:  Please feel free to call your physician or the hospital operator if you have any questions, and they will be happy to assist you.

## 2012-08-19 ENCOUNTER — Encounter (HOSPITAL_COMMUNITY)
Admission: RE | Admit: 2012-08-19 | Discharge: 2012-08-19 | Disposition: A | Discharge status: Home / Self Care | Payer: 59 | Source: Ambulatory Visit | Attending: Ophthalmology | Admitting: Ophthalmology

## 2012-08-19 ENCOUNTER — Encounter (HOSPITAL_COMMUNITY): Payer: Self-pay

## 2012-08-19 LAB — BASIC METABOLIC PANEL
CO2: 28 mEq/L (ref 19–32)
Glucose, Bld: 90 mg/dL (ref 70–99)
Potassium: 4 mEq/L (ref 3.5–5.1)
Sodium: 138 mEq/L (ref 135–145)

## 2012-08-19 NOTE — Progress Notes (Signed)
08/19/12 1524  OBSTRUCTIVE SLEEP APNEA  Have you ever been diagnosed with sleep apnea through a sleep study? No  Do you snore loudly (loud enough to be heard through closed doors)?  1  Do you often feel tired, fatigued, or sleepy during the daytime? 1  Has anyone observed you stop breathing during your sleep? 1  Do you have, or are you being treated for high blood pressure? 1  BMI more than 35 kg/m2? 1  Age over 58 years old? 1  Neck circumference greater than 40 cm/18 inches? 1  Gender: 1  Obstructive Sleep Apnea Score 8  Score 4 or greater  Results sent to PCP

## 2012-08-26 ENCOUNTER — Ambulatory Visit (HOSPITAL_COMMUNITY): Payer: 59 | Admitting: Anesthesiology

## 2012-08-26 ENCOUNTER — Encounter (HOSPITAL_COMMUNITY)
Admission: RE | Disposition: A | Discharge status: Home / Self Care | Payer: Self-pay | Source: Ambulatory Visit | Attending: Ophthalmology

## 2012-08-26 ENCOUNTER — Encounter (HOSPITAL_COMMUNITY): Payer: Self-pay | Admitting: Anesthesiology

## 2012-08-26 ENCOUNTER — Encounter (HOSPITAL_COMMUNITY): Payer: Self-pay | Admitting: *Deleted

## 2012-08-26 ENCOUNTER — Ambulatory Visit (HOSPITAL_COMMUNITY)
Admission: RE | Admit: 2012-08-26 | Discharge: 2012-08-26 | Disposition: A | Discharge status: Home / Self Care | Payer: 59 | Source: Ambulatory Visit | Attending: Ophthalmology | Admitting: Ophthalmology

## 2012-08-26 DIAGNOSIS — Z79899 Other long term (current) drug therapy: Secondary | ICD-10-CM | POA: Insufficient documentation

## 2012-08-26 DIAGNOSIS — Z01812 Encounter for preprocedural laboratory examination: Secondary | ICD-10-CM | POA: Insufficient documentation

## 2012-08-26 DIAGNOSIS — H2510 Age-related nuclear cataract, unspecified eye: Secondary | ICD-10-CM | POA: Insufficient documentation

## 2012-08-26 DIAGNOSIS — I10 Essential (primary) hypertension: Secondary | ICD-10-CM | POA: Insufficient documentation

## 2012-08-26 HISTORY — PX: CATARACT EXTRACTION W/PHACO: SHX586

## 2012-08-26 SURGERY — PHACOEMULSIFICATION, CATARACT, WITH IOL INSERTION
Anesthesia: Monitor Anesthesia Care | Site: Eye | Laterality: Right | Wound class: Clean

## 2012-08-26 MED ORDER — PHENYLEPHRINE HCL 2.5 % OP SOLN
1.0000 [drp] | OPHTHALMIC | Status: AC
  Administered 2012-08-26 (×3): 1 [drp] via OPHTHALMIC

## 2012-08-26 MED ORDER — LACTATED RINGERS IV SOLN
INTRAVENOUS | Status: DC
  Administered 2012-08-26: 13:00:00 via INTRAVENOUS

## 2012-08-26 MED ORDER — MIDAZOLAM HCL 2 MG/2ML IJ SOLN
INTRAMUSCULAR | Status: AC
  Filled 2012-08-26: qty 2

## 2012-08-26 MED ORDER — POVIDONE-IODINE 5 % OP SOLN
OPHTHALMIC | Status: DC | PRN
  Administered 2012-08-26: 1 via OPHTHALMIC

## 2012-08-26 MED ORDER — LIDOCAINE HCL (PF) 1 % IJ SOLN
INTRAMUSCULAR | Status: DC | PRN
  Administered 2012-08-26: .4 mL

## 2012-08-26 MED ORDER — CYCLOPENTOLATE-PHENYLEPHRINE 0.2-1 % OP SOLN
1.0000 [drp] | OPHTHALMIC | Status: AC
  Administered 2012-08-26 (×3): 1 [drp] via OPHTHALMIC

## 2012-08-26 MED ORDER — TETRACAINE HCL 0.5 % OP SOLN
1.0000 [drp] | OPHTHALMIC | Status: AC
  Administered 2012-08-26 (×3): 1 [drp] via OPHTHALMIC

## 2012-08-26 MED ORDER — LIDOCAINE 3.5 % OP GEL OPTIME - NO CHARGE
OPHTHALMIC | Status: DC | PRN
  Administered 2012-08-26: 1 [drp] via OPHTHALMIC

## 2012-08-26 MED ORDER — LIDOCAINE HCL 3.5 % OP GEL
1.0000 "application " | Freq: Once | OPHTHALMIC | Status: AC
  Administered 2012-08-26: 1 via OPHTHALMIC

## 2012-08-26 MED ORDER — BSS IO SOLN
INTRAOCULAR | Status: DC | PRN
  Administered 2012-08-26: 15 mL via INTRAOCULAR

## 2012-08-26 MED ORDER — NEOMYCIN-POLYMYXIN-DEXAMETH 0.1 % OP OINT
TOPICAL_OINTMENT | OPHTHALMIC | Status: DC | PRN
  Administered 2012-08-26: 1 via OPHTHALMIC

## 2012-08-26 MED ORDER — PROVISC 10 MG/ML IO SOLN
INTRAOCULAR | Status: DC | PRN
  Administered 2012-08-26: 8.5 mg via INTRAOCULAR

## 2012-08-26 MED ORDER — EPINEPHRINE HCL 1 MG/ML IJ SOLN
INTRAMUSCULAR | Status: AC
  Filled 2012-08-26: qty 1

## 2012-08-26 MED ORDER — MIDAZOLAM HCL 2 MG/2ML IJ SOLN
1.0000 mg | INTRAMUSCULAR | Status: DC | PRN
  Administered 2012-08-26: 2 mg via INTRAVENOUS

## 2012-08-26 MED ORDER — EPINEPHRINE HCL 1 MG/ML IJ SOLN
INTRAOCULAR | Status: DC | PRN
  Administered 2012-08-26: 13:00:00

## 2012-08-26 SURGICAL SUPPLY — 32 items

## 2012-08-26 NOTE — H&P (Signed)
I have reviewed the H&P, the patient was re-examined, and I have identified no interval changes in medical condition and plan of care since the history and physical of record  

## 2012-08-26 NOTE — Anesthesia Postprocedure Evaluation (Signed)
  Anesthesia Post-op Note  Patient: [name]  Procedure(s) Performed: Procedure(s) with comments: CATARACT EXTRACTION PHACO AND INTRAOCULAR LENS PLACEMENT (IOC) (Right) - CDE: 1.13  Patient Location: PACU and Short Stay  Anesthesia Type:MAC  Level of Consciousness: awake, alert  and oriented  Airway and Oxygen Therapy: Patient Spontanous Breathing  Post-op Pain: none  Post-op Assessment: Post-op Vital signs reviewed, Patient's Cardiovascular Status Stable, Respiratory Function Stable, Patent Airway and No signs of Nausea or vomiting  Post-op Vital Signs: Reviewed and stable  Complications: No apparent anesthesia complications

## 2012-08-26 NOTE — Op Note (Signed)
Date of Admission: 08/26/2012  Date of Surgery: 08/26/2012  Pre-Op Dx: Cataract  Right  Eye  Post-Op Dx: Cataract  Right  Eye,  Dx Code 366.19  Surgeon: Gemma Payor, M.D.  Assistants: None  Anesthesia: Topical with MAC  Indications: Painless, progressive loss of vision with compromise of daily activities.  Surgery: Cataract Extraction with Intraocular lens Implant Right Eye  Discription: The patient had dilating drops and viscous lidocaine placed into the left eye in the pre-op holding area. After transfer to the operating room, a time out was performed. The patient was then prepped and draped. Beginning with a 75 degree blade a paracentesis port was made at the surgeon's 2 o'clock position. The anterior chamber was then filled with 1% non-preserved lidocaine. This was followed by filling the anterior chamber with Provisc. A bent cystatome needle was used to create a continuous tear capsulotomy. Hydrodissection was performed with balanced salt solution on a Fine canula. The lens nucleus was then removed using the phacoemulsification handpiece. Residual cortex was removed with the I&A handpiece. The anterior chamber and capsular bag were refilled with Provisc. A posterior chamber intraocular lens was placed into the capsular bag with it's injector. The implant was positioned with the Kuglan hook. The Provisc was then removed from the anterior chamber and capsular bag with the I&A handpiece. Stromal hydration of the main incision and paracentesis port was performed with BSS on a Fine canula. The wounds were tested for leak which was negative. The patient tolerated the procedure well. There were no operative complications. The patient was then transferred to the recovery room in stable condition.  Complications: None  Specimen: None  EBL: None  Prosthetic device: B&L enVista, MX60, power 19.0D.

## 2012-08-26 NOTE — Anesthesia Preprocedure Evaluation (Addendum)
Anesthesia Evaluation  Patient identified by MRN, date of birth, ID band Patient awake    Reviewed: Allergy & Precautions, H&P , NPO status , Patient's Chart, lab work & pertinent test results, reviewed documented beta blocker date and time   Airway Mallampati: I TM Distance: >3 FB     Dental  (+) Teeth Intact   Pulmonary  breath sounds clear to auscultation        Cardiovascular hypertension, Pt. on medications + CAD and + Past MI Rhythm:Regular Rate:Normal     Neuro/Psych    GI/Hepatic GERD-  Medicated,  Endo/Other    Renal/GU      Musculoskeletal   Abdominal   Peds  Hematology   Anesthesia Other Findings   Reproductive/Obstetrics                           Anesthesia Physical Anesthesia Plan  ASA: III  Anesthesia Plan: MAC   Post-op Pain Management:    Induction: Intravenous  Airway Management Planned: Nasal Cannula  Additional Equipment:   Intra-op Plan:   Post-operative Plan:   Informed Consent: I have reviewed the patients History and Physical, chart, labs and discussed the procedure including the risks, benefits and alternatives for the proposed anesthesia with the patient or authorized representative who has indicated his/her understanding and acceptance.     Plan Discussed with:   Anesthesia Plan Comments: (Patient instructed to take beta blocker when he returns home.)       Anesthesia Quick Evaluation

## 2012-08-26 NOTE — Transfer of Care (Signed)
Immediate Anesthesia Transfer of Care Note  Patient: [name]  Procedure(s) Performed: Procedure(s) with comments: CATARACT EXTRACTION PHACO AND INTRAOCULAR LENS PLACEMENT (IOC) (Right) - CDE: 1.13  Patient Location: PACU and Short Stay  Anesthesia Type:MAC  Level of Consciousness: awake, alert  and oriented  Airway & Oxygen Therapy: Patient Spontanous Breathing  Post-op Assessment: Report given to PACU RN  Post vital signs: Reviewed and stable  Complications: No apparent anesthesia complications

## 2012-08-27 ENCOUNTER — Encounter (HOSPITAL_COMMUNITY): Payer: Self-pay | Admitting: Ophthalmology

## 2013-04-20 ENCOUNTER — Encounter (INDEPENDENT_AMBULATORY_CARE_PROVIDER_SITE_OTHER): Payer: Self-pay | Admitting: *Deleted

## 2013-06-05 ENCOUNTER — Inpatient Hospital Stay (HOSPITAL_COMMUNITY)
Admission: EM | Admit: 2013-06-05 | Discharge: 2013-06-07 | DRG: 247 | Disposition: A | Discharge status: Home / Self Care | Payer: 59 | Attending: Cardiology | Admitting: Cardiology

## 2013-06-05 ENCOUNTER — Emergency Department (HOSPITAL_COMMUNITY): Payer: 59

## 2013-06-05 ENCOUNTER — Encounter (HOSPITAL_COMMUNITY): Payer: Self-pay | Admitting: Emergency Medicine

## 2013-06-05 DIAGNOSIS — I2510 Atherosclerotic heart disease of native coronary artery without angina pectoris: Secondary | ICD-10-CM | POA: Diagnosis present

## 2013-06-05 DIAGNOSIS — Z79899 Other long term (current) drug therapy: Secondary | ICD-10-CM

## 2013-06-05 DIAGNOSIS — I214 Non-ST elevation (NSTEMI) myocardial infarction: Principal | ICD-10-CM | POA: Diagnosis present

## 2013-06-05 DIAGNOSIS — Z7982 Long term (current) use of aspirin: Secondary | ICD-10-CM

## 2013-06-05 DIAGNOSIS — Z955 Presence of coronary angioplasty implant and graft: Secondary | ICD-10-CM

## 2013-06-05 DIAGNOSIS — I25119 Atherosclerotic heart disease of native coronary artery with unspecified angina pectoris: Secondary | ICD-10-CM | POA: Diagnosis present

## 2013-06-05 DIAGNOSIS — E785 Hyperlipidemia, unspecified: Secondary | ICD-10-CM | POA: Diagnosis present

## 2013-06-05 DIAGNOSIS — R079 Chest pain, unspecified: Secondary | ICD-10-CM

## 2013-06-05 DIAGNOSIS — I10 Essential (primary) hypertension: Secondary | ICD-10-CM | POA: Diagnosis present

## 2013-06-05 DIAGNOSIS — I2582 Chronic total occlusion of coronary artery: Secondary | ICD-10-CM | POA: Diagnosis present

## 2013-06-05 DIAGNOSIS — Z6836 Body mass index (BMI) 36.0-36.9, adult: Secondary | ICD-10-CM

## 2013-06-05 DIAGNOSIS — K219 Gastro-esophageal reflux disease without esophagitis: Secondary | ICD-10-CM | POA: Diagnosis present

## 2013-06-05 DIAGNOSIS — I25810 Atherosclerosis of coronary artery bypass graft(s) without angina pectoris: Secondary | ICD-10-CM | POA: Diagnosis present

## 2013-06-05 DIAGNOSIS — Z9861 Coronary angioplasty status: Secondary | ICD-10-CM

## 2013-06-05 DIAGNOSIS — E669 Obesity, unspecified: Secondary | ICD-10-CM | POA: Diagnosis present

## 2013-06-05 DIAGNOSIS — I2584 Coronary atherosclerosis due to calcified coronary lesion: Secondary | ICD-10-CM | POA: Diagnosis present

## 2013-06-05 DIAGNOSIS — I252 Old myocardial infarction: Secondary | ICD-10-CM

## 2013-06-05 LAB — CREATININE, SERUM
Creatinine, Ser: 0.86 mg/dL (ref 0.50–1.35)
GFR calc non Af Amer: >90 mL/min (ref >90)

## 2013-06-05 LAB — CBC
HCT: 38.7 % — ABNORMAL LOW (ref 39.0–52.0)
HEMATOCRIT: 38.3 % — AB (ref 39.0–52.0)
HEMOGLOBIN: 13.7 g/dL (ref 13.0–17.0)
Hemoglobin: 13.5 g/dL (ref 13.0–17.0)
MCH: 30.7 pg (ref 26.0–34.0)
MCH: 30.8 pg (ref 26.0–34.0)
MCHC: 35.2 g/dL (ref 30.0–36.0)
MCHC: 35.4 g/dL (ref 30.0–36.0)
MCV: 87 fL (ref 78.0–100.0)
MCV: 87 fL (ref 78.0–100.0)
PLATELETS: 216 10*3/uL (ref 150–400)
Platelets: 209 10*3/uL (ref 150–400)
RBC: 4.4 MIL/uL (ref 4.22–5.81)
RBC: 4.45 MIL/uL (ref 4.22–5.81)
RDW: 12.7 % (ref 11.5–15.5)
RDW: 12.7 % (ref 11.5–15.5)
WBC: 8 10*3/uL (ref 4.0–10.5)
WBC: 8 10*3/uL (ref 4.0–10.5)

## 2013-06-05 LAB — D-DIMER, QUANTITATIVE (NOT AT ARMC): D-Dimer, Quant: <0.27 ug/mL-FEU (ref 0.00–0.48)

## 2013-06-05 LAB — BASIC METABOLIC PANEL
BUN: 16 mg/dL (ref 6–23)
CHLORIDE: 101 meq/L (ref 96–112)
CO2: 25 meq/L (ref 19–32)
Calcium: 9 mg/dL (ref 8.4–10.5)
Creatinine, Ser: 0.95 mg/dL (ref 0.50–1.35)
GFR calc Af Amer: >90 mL/min (ref >90)
GLUCOSE: 123 mg/dL — AB (ref 70–99)
POTASSIUM: 3.9 meq/L (ref 3.7–5.3)
SODIUM: 141 meq/L (ref 137–147)

## 2013-06-05 LAB — TROPONIN I
TROPONIN I: 0.69 ng/mL — AB (ref <0.30)
TROPONIN I: 0.71 ng/mL — AB (ref <0.30)

## 2013-06-05 LAB — HEMOGLOBIN A1C
HEMOGLOBIN A1C: 5.5 % (ref <5.7)
Mean Plasma Glucose: 111 mg/dL (ref <117)

## 2013-06-05 LAB — PRO B NATRIURETIC PEPTIDE: Pro B Natriuretic peptide (BNP): 159.9 pg/mL — ABNORMAL HIGH (ref 0–125)

## 2013-06-05 MED ORDER — SODIUM CHLORIDE 0.9 % IJ SOLN
3.0000 mL | INTRAMUSCULAR | Status: DC | PRN
Start: 2013-06-05 — End: 2013-06-06

## 2013-06-05 MED ORDER — HEPARIN (PORCINE) IN NACL 100-0.45 UNIT/ML-% IJ SOLN
1400.0000 [IU]/h | INTRAMUSCULAR | Status: DC
  Administered 2013-06-05: 1300 [IU]/h via INTRAVENOUS
  Filled 2013-06-05 (×2): qty 250

## 2013-06-05 MED ORDER — SODIUM CHLORIDE 0.9 % IJ SOLN
3.0000 mL | Freq: Two times a day (BID) | INTRAMUSCULAR | Status: DC
  Administered 2013-06-05: 3 mL via INTRAVENOUS

## 2013-06-05 MED ORDER — ASPIRIN EC 81 MG PO TBEC
81.0000 mg | DELAYED_RELEASE_TABLET | Freq: Every day | ORAL | Status: DC
  Administered 2013-06-05 – 2013-06-07 (×3): 81 mg via ORAL
  Filled 2013-06-05 (×3): qty 1

## 2013-06-05 MED ORDER — SODIUM CHLORIDE 0.9 % IV SOLN: INTRAVENOUS | Status: DC

## 2013-06-05 MED ORDER — PANTOPRAZOLE SODIUM 40 MG PO TBEC
40.0000 mg | DELAYED_RELEASE_TABLET | Freq: Every day | ORAL | Status: DC
  Administered 2013-06-05 – 2013-06-07 (×3): 40 mg via ORAL
  Filled 2013-06-05 (×2): qty 1

## 2013-06-05 MED ORDER — METOPROLOL TARTRATE 12.5 MG HALF TABLET
12.5000 mg | ORAL_TABLET | Freq: Two times a day (BID) | ORAL | Status: DC
  Administered 2013-06-05 – 2013-06-06 (×4): 12.5 mg via ORAL
  Filled 2013-06-05 (×6): qty 1

## 2013-06-05 MED ORDER — NITROGLYCERIN 0.4 MG SL SUBL
0.4000 mg | SUBLINGUAL_TABLET | SUBLINGUAL | Status: DC | PRN
  Administered 2013-06-05: 0.4 mg via SUBLINGUAL

## 2013-06-05 MED ORDER — NITROGLYCERIN 0.4 MG SL SUBL
SUBLINGUAL_TABLET | SUBLINGUAL | Status: AC
  Filled 2013-06-05: qty 1

## 2013-06-05 MED ORDER — SODIUM CHLORIDE 0.9 % IJ SOLN: 3.0000 mL | INTRAMUSCULAR | Status: DC | PRN

## 2013-06-05 MED ORDER — ENOXAPARIN SODIUM 40 MG/0.4ML ~~LOC~~ SOLN
40.0000 mg | SUBCUTANEOUS | Status: DC
  Administered 2013-06-05: 40 mg via SUBCUTANEOUS
  Filled 2013-06-05: qty 0.4

## 2013-06-05 MED ORDER — SODIUM CHLORIDE 0.9 % IV SOLN: 250.0000 mL | INTRAVENOUS | Status: DC | PRN

## 2013-06-05 MED ORDER — ATORVASTATIN CALCIUM 40 MG PO TABS
40.0000 mg | ORAL_TABLET | Freq: Every day | ORAL | Status: DC
  Administered 2013-06-05: 40 mg via ORAL
  Filled 2013-06-05 (×2): qty 1

## 2013-06-05 MED ORDER — NITROGLYCERIN 0.4 MG SL SUBL: 0.4000 mg | SUBLINGUAL_TABLET | SUBLINGUAL | Status: DC | PRN

## 2013-06-05 NOTE — ED Provider Notes (Signed)
CSN: [id number]     Arrival date & time 06/05/13  0402 History   First MD Initiated Contact with Patient 06/05/13 0407     Chief Complaint  Patient presents with  . Chest Pain     (Consider location/radiation/quality/duration/timing/severity/associated sxs/prior Treatment) HPI 58 year old male presents emergency department from home via EMS with complaint of chest pain.  Pain woke him up tonight at 2 AM.  Patient reports pain was intense, like an elephant sitting on his chest.  With that he had mild nausea and shortness of breath.  Patient has history of CABG and stents.  He is followed by Dr. Johnsie Cancel.  Patient reports Grossly year ago.  That did not show any blockages.  Patient reports he had similar milder event on Thursday.  He took a nitroglycerin without improvement in his pain.  Patient was given TUMS by coworker, and slow the pain eased off.  Tonight.  Patient has received full dose of aspirin, and nitroglycerin.  Pain reduced to 3/10 with nitroglycerin.  Patient has received additional nitroglycerin, and reports pain is 0.  Vision has history of hypertension, hyperlipidemia, MI, coronary disease, and GERD. Past Medical History  Diagnosis Date  . CAD (coronary artery disease) 2000    History of MI. S/p Angioplasty 2001; s/p CABG x4 2006. Patent grafts by cath 04/2011 with normal EF  . History of hypertension   . Hyperlipidemia   . Obesity   . Hydrocele 2012  . Myocardial infarction 2000  . Hypertension   . GERD (gastroesophageal reflux disease)   . Asthma    Past Surgical History  Procedure Laterality Date  . Coronary artery bypass grafting  May 2006    Four-vessel  . Pilonidal cyst excision    . Tonsillectomy    . Foot fracture surgery      right  . Cataract extraction w/phaco Left 05/17/2012    Procedure: CATARACT EXTRACTION PHACO AND INTRAOCULAR LENS PLACEMENT (IOC);  Surgeon: Tonny Branch, MD;  Location: [facility];  Service: Ophthalmology;  Laterality: Left;  CDE:  0.76  .  Coronary angioplasty  2000  . Coronary artery bypass graft  2006    4 vessels  . Cataract extraction w/phaco Right 08/26/2012    Procedure: CATARACT EXTRACTION PHACO AND INTRAOCULAR LENS PLACEMENT (IOC);  Surgeon: Tonny Branch, MD;  Location: [facility];  Service: Ophthalmology;  Laterality: Right;  CDE: 1.13   Family History  Problem Relation Age of Onset  . Cancer Mother   . Cervical cancer Mother   . Cancer Father   . Throat cancer Father   . Healthy Daughter   . Healthy Son    History  Substance Use Topics  . Smoking status: Never Smoker   . Smokeless tobacco: Never Used  . Alcohol Use: No    Review of Systems  See History of Present Illness; otherwise all other systems are reviewed and negative   Allergies  Review of patient's allergies indicates no known allergies.  Home Medications   Current Outpatient Rx  Name  Route  Sig  Dispense  Refill  . aspirin EC 81 MG tablet   Oral   Take 81 mg by mouth daily.         Marland Kitchen atorvastatin (LIPITOR) 40 MG tablet   Oral   Take 40 mg by mouth daily.         . metoprolol tartrate (LOPRESSOR) 25 MG tablet   Oral   Take 0.5 tablets (12.5 mg total) by mouth 2 (two) times  daily.   60 tablet   6   . nitroGLYCERIN (NITROSTAT) 0.4 MG SL tablet   Sublingual   Place 0.4 mg under the tongue every 5 (five) minutes as needed for chest pain.          BP 154/72  Pulse 72  Temp(Src) 99.1 F (37.3 C) (Oral)  Resp 14  SpO2 99% Physical Exam  Nursing note and vitals reviewed. Constitutional: He is oriented to person, place, and time. He appears well-developed and well-nourished.  HENT:  Head: Normocephalic and atraumatic.  Nose: Nose normal.  Mouth/Throat: Oropharynx is clear and moist.  Eyes: Conjunctivae and EOM are normal. Pupils are equal, round, and reactive to light.  Neck: Normal range of motion. Neck supple. No JVD present. No tracheal deviation present. No thyromegaly present.  Cardiovascular: Normal rate, regular rhythm,  normal heart sounds and intact distal pulses.  Exam reveals no gallop and no friction rub.   No murmur heard. Pulmonary/Chest: Effort normal and breath sounds normal. No stridor. No respiratory distress. He has no wheezes. He has no rales. He exhibits no tenderness.  Abdominal: Soft. Bowel sounds are normal. He exhibits no distension and no mass. There is no tenderness. There is no rebound and no guarding.  Musculoskeletal: Normal range of motion. He exhibits no edema and no tenderness.  Lymphadenopathy:    He has no cervical adenopathy.  Neurological: He is alert and oriented to person, place, and time. He exhibits normal muscle tone. Coordination normal.  Skin: Skin is warm and dry. No rash noted. No erythema. No pallor.  Psychiatric: He has a normal mood and affect. His behavior is normal. Judgment and thought content normal.    ED Course  Procedures (including critical care time) Labs Review Labs Reviewed  CBC - Abnormal; Notable for the following:    HCT 38.3 (*)    All other components within normal limits  BASIC METABOLIC PANEL - Abnormal; Notable for the following:    Glucose, Bld 123 (*)    All other components within normal limits  PRO B NATRIURETIC PEPTIDE - Abnormal; Notable for the following:    Pro B Natriuretic peptide (BNP) 159.9 (*)    All other components within normal limits  TROPONIN I  D-DIMER, QUANTITATIVE   Imaging Review Dg Chest Port 1 View  06/05/2013   CLINICAL DATA:  Chest pain and shortness of breath since this morning.  EXAM: PORTABLE CHEST - 1 VIEW  COMPARISON:  DG CHEST 1V PORT dated 05/03/2011  FINDINGS: Cardiac silhouette appears upper limits of normal in size, mediastinal silhouette is nonsuspicious, status post median sternotomy. No pleural effusions or focal consolidations. No pneumothorax.  Soft tissue planes and included osseous structures are nonsuspicious. Multiple EKG lines overlie the patient and may obscure subtle underlying pathology.   IMPRESSION: Borderline cardiomegaly, no acute pulmonary process.   Electronically Signed   By: Elon Alas   On: 06/05/2013 04:29     EKG Interpretation   Date/Time:  Sunday June 05 2013 04:13:50 EDT Ventricular Rate:  79 PR Interval:  162 QRS Duration: 103 QT Interval:  393 QTC Calculation: 450 R Axis:   94 Text Interpretation:  Sinus rhythm Consider right ventricular hypertrophy  No significant change since last tracing Confirmed by Shamia Uppal  MD, Shakeema Lippman  ([id number]) on 06/05/2013 6:39:21 AM      MDM   Final diagnoses:  Chest pain    58 year old male with chest pain, history of coronary disease, status post CABG, and stents.  Patient with similar episode early in the week.  EKG without ischemic changes, initial troponin is negative.  Pain has been controlled with sublingual nitroglycerin.  We'll contact cardiology for their evaluation.    Kalman Drape, MD 06/05/13 [phone]

## 2013-06-05 NOTE — Progress Notes (Signed)
CRITICAL VALUE ALERT  Critical value received: troponin 0.69  Date of notification:  06/05/13  Time of notification:  8453  Critical value read back yes  Nurse who received alert:  Albertine Grates RN  MD notified (1st page):  Brackbill  Time of first page:  1530  MD notified (2nd page):  Time of second page:  Responding MD: Nicole Kindred PA  Time MD responded:  6468 Pt denies c/p, orders received.

## 2013-06-05 NOTE — ED Notes (Addendum)
Chest pain. - substernal. Reproducible with inspiration. C/o  0200 - cabg in 2006. Pt. Htn. Pt. Took 324 mg of asa and x 1 ntg. Sl. 0.4 mg. And pain level is now 3/10. Some sob with pain

## 2013-06-05 NOTE — Progress Notes (Signed)
2nd trop-I returned mildly elevated at 0.69. Pt asymptomatic, cp free this afternoon. Will d/c DVT-dose Lovenox, start ACS-dose heparin per pharmacy. Plan for cardiac cath tomorrow. Orders placed. NPO past MN. Updated patient.    Jacquelynn Cree, PA-C 06/05/2013 4:03 PM

## 2013-06-05 NOTE — H&P (Signed)
CARDIOLOGY HISTORY AND PHYSICAL   Patient ID: [name] MRN: [id number]  DOB/AGE: 59-Jun-1957 59 y.o. Admit date: 06/05/2013  Primary Care Physician: Nathan Ruths, MD Primary Cardiologist: Nathan Haws MD Sidney Ace)  Clinical Summary [name] is a 58 y.o.male morbidly obese, with known history of CAD, status post CABG in 2006 (LIMA-LAD, RIMA-acute marginal, SVG-DX, SVG-RCA), hypertension, hyperlipidemia. Most recent cardiac cath 2/13 with patent graphs with good LV fx., hypertension, GERD, hyperlipidemia, Obesity.    He was in his usual state of health until he awoke around 2 am with intense substernal chest pressure, "felt like elephant sitting on my chest." He took NTG, but did not feel the tingling. Drank some water. No relief. Finally called EMS to bring to ER. He was given NTG in the field with temporary relief, but pain recurred again in ER, and was given second NTG. No further chest pain since then.    He states the pain is similar to the pain he experienced when he was admitted in Feb 2013 for cardiac cath, which demonstrated patent grafts. 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 Took some Tums with relief in about 30 minutes.    He states that he has been gaining wt and retaining fluid in his lower extremities from the knees down and some in his abdomen. He was treated for arthritis in his knees by PCP, and swelling got better, but is now returning. He says he is easily full due to "bloating feeling" in his abdomen.   He also states that he has not been taking his antihypertensive medications as directed ( metoprolol 25 mg BID). He checks his BP at home and finds it to be in the 120's systolic. He is incredulous about coming to ER. He says he doesn't want to come for every ache and pain, but this time, the pain was severe. With his cardiac history, he became concerned.    Cardiac markers are negative on first evaluation.  EKG NSR non-specific T-wave abnormalities in lead I and V1 Pro-BNP 159. CXR negative for CHF or pulmonary edema. D-Dimer 0.27.      No Known Allergies  Home Medications 1. Metoprolol 25 mg BID 2. Atorvastatin 40 mg Daily 3. ASA 81 mg daily   PRN Medications nitroGLYCERIN  Past Medical History  Diagnosis Date  . CAD (coronary artery disease) 2000    History of MI. S/p Angioplasty 2001; s/p CABG x4 2006. Patent grafts by cath 04/2011 with normal EF  . History of hypertension   . Hyperlipidemia   . Obesity   . Hydrocele 2012  . Myocardial infarction 2000  . Hypertension   . GERD (gastroesophageal reflux disease)   . Asthma     Past Surgical History  Procedure Laterality Date  . Coronary artery bypass grafting  May 2006    Four-vessel  . Pilonidal cyst excision    . Tonsillectomy    . Foot fracture surgery      right  . Cataract extraction w/phaco Left 05/17/2012    Procedure: CATARACT EXTRACTION PHACO AND INTRAOCULAR LENS PLACEMENT (IOC);  Surgeon: Nathan Payor, MD;  Location: [facility];  Service: Ophthalmology;  Laterality: Left;  CDE:  0.76  . Coronary angioplasty  2000  . Coronary artery bypass graft  2006    4 vessels  . Cataract extraction w/phaco Right 08/26/2012    Procedure: CATARACT EXTRACTION PHACO AND INTRAOCULAR LENS PLACEMENT (IOC);  Surgeon: Nathan Payor, MD;  Location: [facility];  Service: Ophthalmology;  Laterality: Right;  CDE: 1.13    Family History  Problem Relation Age of Onset  . Cancer Mother   . Cervical cancer Mother   . Cancer Father   . Throat cancer Father   . Healthy Daughter   . Healthy Son     Social History [name] reports that he has never smoked. He has never used smokeless tobacco. [name] reports that he does not drink alcohol.  Review of Systems Otherwise reviewed and negative except as outlined.  Physical Examination Temp:  [98 F (36.7 C)-99.1 F (37.3 C)] 98 F (36.7 C) (03/15 0756) Pulse Rate:  [63-72] 63 (03/15  0756) Resp:  [12-14] 12 (03/15 0756) BP: (150-154)/(66-72) 150/66 mmHg (03/15 0756) SpO2:  [99 %-100 %] 100 % (03/15 0756) No intake or output data in the 24 hours ending 06/05/13 0859  Gen: No acute distress. HEENT: Conjunctiva and lids normal, oropharynx clear with moist mucosa. Neck: Supple, no elevated JVP or carotid bruits, no thyromegaly. Lungs: Clear to auscultation, nonlabored breathing at rest.Some soreness with deep inspiration. Cardiac: Regular rate and rhythm, no S3 or significant systolic murmur, no pericardial rub. Abdomen: Soft, nontender, obese,no hepatomegaly, bowel sounds present, no guarding or rebound. Extremities: No pitting edema, distal pulses 2+. Skin: Warm and dry. Musculoskeletal: No kyphosis. Neuropsychiatric: Alert and oriented x3, affect grossly appropriate.  Prior Cardiac Procedures  1. Cath done 05/03/11  Coronary Arteries:  Right dominant with no anomalies  LM:50% diffuse proximal  LAD: 100% at take off of septal perforator  Circumflex: 100% mid  RCA: 100% proximal  Grafts:  SVG- OM normal  SVG- RCA 30% mid body. RCA small artery with 30% distal disease  Free RIMA:- Diagonal 30% mid body LIMA: to LAD normal with 30% disease in distal LAD  Ventriculography: EF: 55 %, no discrete RWMA;s  Hemodynamics:  Aortic Pressure: 151/76 mmHg LV Pressure: 157/10 mmHg  Impression: No areas of potential ischemia. Patent grafts and good LV function. Pain would appear to be non-cardiac.  2.Echocardiogram Left ventricle: The cavity size was normal. Wall thickness was increased in a pattern of mild LVH. Systolic function was normal. The estimated ejection fraction was 55%. Although no diagnostic regional wall motion abnormality was identified, this possibility cannot be completely excluded on the basis of this study. Features are consistent with a pseudonormal left ventricular filling pattern, with concomitant abnormal relaxation and increased filling pressure  (grade 2 diastolic dysfunction). - Aortic valve: There was no stenosis. - Mitral valve: No significant regurgitation. - Left atrium: The atrium was mildly dilated. - Right ventricle: The cavity size was mildly dilated. Systolic function was normal. - Right atrium: The atrium was mildly dilated. - Pulmonary arteries: No complete TR doppler jet so unable to estimate PA systolic pressure. - Systemic veins: IVC measured 2.0 cm with normal respirophasic variation, suggesting RA pressure 6-10 mmHg.   Lab Results  Basic Metabolic Panel:  Recent Labs Lab 06/05/13 0445  NA 141  K 3.9  CL 101  CO2 25  GLUCOSE 123*  BUN 16  CREATININE 0.95  CALCIUM 9.0    CBC:  Recent Labs Lab 06/05/13 0445  WBC 8.0  HGB 13.5  HCT 38.3*  MCV 87.0  PLT 209    Cardiac Enzymes:  Recent Labs Lab 06/05/13 0445  TROPONINI <0.30     Radiology Dg Chest Port 1 View  06/05/2013   CLINICAL DATA:  Chest pain and shortness of breath since this morning.  EXAM: PORTABLE CHEST - 1 VIEW  COMPARISON:  DG CHEST 1V PORT dated 05/03/2011  FINDINGS: Cardiac silhouette appears upper limits of normal in size, mediastinal silhouette is nonsuspicious, status post median sternotomy. No pleural effusions or focal consolidations. No pneumothorax.  Soft tissue planes and included osseous structures are nonsuspicious. Multiple EKG lines overlie the patient and may obscure subtle underlying pathology.  IMPRESSION: Borderline cardiomegaly, no acute pulmonary process.   Electronically Signed   By: Elon Alas   On: 06/05/2013 04:29    ECG:  NSR with non-specific T-wave abnormalities in lead I and V1.    Impression and Recommendations  1.Chest Pain:  Worrisome for unstable angina, awakening him from sleep around 2 am. Pain similar to pain he had prior to admission for NSTEMI in Feb or 2013. Felt like "elephant on my chest." Relief with NTG. Last cath demonstrated patent grafts, but with SVG graft to RCA 30% mid  body, and 30% distal LAD disease. Troponin is negative on first evaluation. He is pain free now. Will admit to rule out MI and consider NM stress test vs repeat cath if troponin becomes positive. Echo for changes in LV fx.  Other consideration for OSA evaluation if he rules out for cardiac etiology of chest discomfort.   2. CAD: S/P CABG with LIMA to LAD, SVG to OM, SVG to RCA, Free RIMA to Diagonal. Most recent cath in Feb of 2013 revealing patient grafts and normal LV function of 55%. Continue ECASA 81 mg and metoprolol.   3. Hypertension:  Has not been taking metoprolol at home, as he reports BP in the 466'Z systolic. Will restart during hospitalization and monitor response.   4. Hypercholesterolemia:  No recent documentation of lipid status since 2013. Continue atorvatstatin. Will repeat fasting study.  5. GERD: Has been on Dexilant in the past with good result.   6. Obesity: Contributing to overall health status. Wt loss and exercise are strongly recommended. As stated, consideration for OSA work-up as OP at the discretion of PCP.  Signed: Jory Sims 06/05/2013, 8:59 AM Co-Sign MD Agree with assessment and plan as noted above.  The patient tried his outdated nitroglycerin at home which did not help the pain.  However he was given fresh nitroglycerin sublingually by EMS and the pain improved and after arrival in the emergency room was given a second nitroglycerin with further improvement.  The pain is very similar to the pain that he experienced prior to his last cardiac catheterization.  At that time it was felt that his pain was secondary to noncardiac causes.  We will cycle enzymes and proceed with nuclear stress test tomorrow if enzymes remain negative.  Cardiac examination today is unremarkable.  At the present time he is pain-free and in no distress

## 2013-06-05 NOTE — ED Notes (Signed)
Cardiology at bedside.

## 2013-06-05 NOTE — ED Notes (Signed)
Dr. Brackbill at bedside 

## 2013-06-05 NOTE — ED Notes (Signed)
RN on 3W will call for report after she discharges pt.

## 2013-06-05 NOTE — Progress Notes (Signed)
ANTICOAGULATION CONSULT NOTE - Initial Consult  Pharmacy Consult for heparin  Indication: chest pain/ACS  No Known Allergies  Patient Measurements: Height: 5' 8.9" (175 cm) Weight: 242 lb 8.1 oz (110 kg) (estimate) IBW/kg (Calculated) : 70.47 Heparin Dosing Weight:92kg  Vital Signs: Temp: 98.1 F (36.7 C) (03/15 1403) Temp src: Oral (03/15 1403) BP: 137/73 mmHg (03/15 1403) Pulse Rate: 72 (03/15 1403)  Labs:  Recent Labs  06/05/13 0445 06/05/13 1331  HGB 13.5 13.7  HCT 38.3* 38.7*  PLT 209 216  CREATININE 0.95 0.86  TROPONINI <0.30 0.69*    Estimated Creatinine Clearance: 114.3 ml/min (by C-G formula based on Cr of 0.86).   Medical History: Past Medical History  Diagnosis Date  . CAD (coronary artery disease) 2000    History of MI. S/p Angioplasty 2001; s/p CABG x4 2006. Patent grafts by cath 04/2011 with normal EF  . History of hypertension   . Hyperlipidemia   . Obesity   . Hydrocele 2012  . Hypertension   . GERD (gastroesophageal reflux disease)   . Asthma   . Myocardial infarction 2000    PCI x2   Assessment: 58 year old male with chest pain and elevated cardiac enzymes. Will change from sq lovenox to IV heparin for ACS. Will not bolus with lovenox given just 2 hours ago. CBC within normal limits.   Goal of Therapy:  Heparin level 0.3-0.7 units/ml Monitor platelets by anticoagulation protocol: Yes   Plan:  Start heparin infusion at 1300 units/hr Check anti-Xa level in 6 hours and daily while on heparin Continue to monitor H&H and platelets  Erin Hearing PharmD., BCPS Clinical Pharmacist Pager [phone] 06/05/2013 4:06 PM

## 2013-06-06 ENCOUNTER — Encounter (HOSPITAL_COMMUNITY)
Admission: EM | Disposition: A | Discharge status: Home / Self Care | Payer: 59 | Source: Home / Self Care | Attending: Cardiology

## 2013-06-06 DIAGNOSIS — K219 Gastro-esophageal reflux disease without esophagitis: Secondary | ICD-10-CM

## 2013-06-06 DIAGNOSIS — I214 Non-ST elevation (NSTEMI) myocardial infarction: Secondary | ICD-10-CM | POA: Diagnosis present

## 2013-06-06 DIAGNOSIS — I517 Cardiomegaly: Secondary | ICD-10-CM

## 2013-06-06 DIAGNOSIS — E785 Hyperlipidemia, unspecified: Secondary | ICD-10-CM

## 2013-06-06 DIAGNOSIS — I25810 Atherosclerosis of coronary artery bypass graft(s) without angina pectoris: Secondary | ICD-10-CM

## 2013-06-06 HISTORY — PX: LEFT HEART CATHETERIZATION WITH CORONARY/GRAFT ANGIOGRAM: SHX5450

## 2013-06-06 LAB — CBC
HEMATOCRIT: 39 % (ref 39.0–52.0)
Hemoglobin: 13.6 g/dL (ref 13.0–17.0)
MCH: 30.6 pg (ref 26.0–34.0)
MCHC: 34.9 g/dL (ref 30.0–36.0)
MCV: 87.8 fL (ref 78.0–100.0)
Platelets: 222 10*3/uL (ref 150–400)
RBC: 4.44 MIL/uL (ref 4.22–5.81)
RDW: 12.7 % (ref 11.5–15.5)
WBC: 8.5 10*3/uL (ref 4.0–10.5)

## 2013-06-06 LAB — BASIC METABOLIC PANEL
BUN: 15 mg/dL (ref 6–23)
CO2: 28 mEq/L (ref 19–32)
CREATININE: 1.04 mg/dL (ref 0.50–1.35)
Calcium: 9.1 mg/dL (ref 8.4–10.5)
Chloride: 100 mEq/L (ref 96–112)
GFR calc Af Amer: 90 mL/min — ABNORMAL LOW (ref >90)
GFR calc non Af Amer: 77 mL/min — ABNORMAL LOW (ref >90)
GLUCOSE: 102 mg/dL — AB (ref 70–99)
Potassium: 4.3 mEq/L (ref 3.7–5.3)
Sodium: 140 mEq/L (ref 137–147)

## 2013-06-06 LAB — POCT ACTIVATED CLOTTING TIME: Activated Clotting Time: 459 seconds

## 2013-06-06 LAB — LIPID PANEL
Cholesterol: 203 mg/dL — ABNORMAL HIGH (ref 0–200)
HDL: 51 mg/dL (ref >39)
LDL CALC: 126 mg/dL — AB (ref 0–99)
Total CHOL/HDL Ratio: 4 RATIO
Triglycerides: 132 mg/dL (ref <150)
VLDL: 26 mg/dL (ref 0–40)

## 2013-06-06 LAB — PROTIME-INR
INR: 1.05 (ref 0.00–1.49)
Prothrombin Time: 13.5 seconds (ref 11.6–15.2)

## 2013-06-06 LAB — TROPONIN I: Troponin I: 0.46 ng/mL (ref <0.30)

## 2013-06-06 LAB — HEPARIN LEVEL (UNFRACTIONATED)
HEPARIN UNFRACTIONATED: 0.3 [IU]/mL (ref 0.30–0.70)
Heparin Unfractionated: 0.33 IU/mL (ref 0.30–0.70)

## 2013-06-06 SURGERY — LEFT HEART CATHETERIZATION WITH CORONARY/GRAFT ANGIOGRAM
Anesthesia: LOCAL

## 2013-06-06 MED ORDER — MIDAZOLAM HCL 2 MG/2ML IJ SOLN
INTRAMUSCULAR | Status: AC
Start: 2013-06-06 — End: 2013-06-06
  Filled 2013-06-06: qty 2

## 2013-06-06 MED ORDER — SODIUM CHLORIDE 0.9 % IJ SOLN: 3.0000 mL | INTRAMUSCULAR | Status: DC | PRN

## 2013-06-06 MED ORDER — PRASUGREL HCL 10 MG PO TABS
10.0000 mg | ORAL_TABLET | Freq: Every day | ORAL | Status: DC
  Administered 2013-06-07: 10 mg via ORAL
  Filled 2013-06-06: qty 1

## 2013-06-06 MED ORDER — LIDOCAINE HCL (PF) 1 % IJ SOLN
INTRAMUSCULAR | Status: AC
  Filled 2013-06-06: qty 30

## 2013-06-06 MED ORDER — BIVALIRUDIN 250 MG IV SOLR
INTRAVENOUS | Status: AC
  Filled 2013-06-06: qty 250

## 2013-06-06 MED ORDER — OXYCODONE-ACETAMINOPHEN 5-325 MG PO TABS: 1.0000 | ORAL_TABLET | ORAL | Status: DC | PRN

## 2013-06-06 MED ORDER — SODIUM CHLORIDE 0.9 % IV SOLN
1.0000 mL/kg/h | INTRAVENOUS | Status: AC
  Administered 2013-06-06: 1 mL/kg/h via INTRAVENOUS

## 2013-06-06 MED ORDER — SODIUM CHLORIDE 0.9 % IJ SOLN: 3.0000 mL | Freq: Two times a day (BID) | INTRAMUSCULAR | Status: DC

## 2013-06-06 MED ORDER — SODIUM CHLORIDE 0.9 % IV SOLN
0.2500 mg/kg/h | INTRAVENOUS | Status: DC
  Filled 2013-06-06: qty 250

## 2013-06-06 MED ORDER — HEPARIN (PORCINE) IN NACL 2-0.9 UNIT/ML-% IJ SOLN
INTRAMUSCULAR | Status: AC
  Filled 2013-06-06: qty 1500

## 2013-06-06 MED ORDER — FENTANYL CITRATE 0.05 MG/ML IJ SOLN
INTRAMUSCULAR | Status: AC
  Filled 2013-06-06: qty 2

## 2013-06-06 MED ORDER — HEPARIN SODIUM (PORCINE) 1000 UNIT/ML IJ SOLN
INTRAMUSCULAR | Status: AC
  Filled 2013-06-06: qty 1

## 2013-06-06 MED ORDER — NITROGLYCERIN 0.2 MG/ML ON CALL CATH LAB
INTRAVENOUS | Status: AC
  Filled 2013-06-06: qty 1

## 2013-06-06 MED ORDER — SODIUM CHLORIDE 0.9 % IV SOLN: 250.0000 mL | INTRAVENOUS | Status: DC | PRN

## 2013-06-06 MED ORDER — PRASUGREL HCL 10 MG PO TABS
ORAL_TABLET | ORAL | Status: AC
  Filled 2013-06-06: qty 6

## 2013-06-06 MED ORDER — ATORVASTATIN CALCIUM 80 MG PO TABS
80.0000 mg | ORAL_TABLET | Freq: Every day | ORAL | Status: DC
  Administered 2013-06-06 – 2013-06-07 (×2): 80 mg via ORAL
  Filled 2013-06-06 (×2): qty 1

## 2013-06-06 MED ORDER — VERAPAMIL HCL 2.5 MG/ML IV SOLN
INTRAVENOUS | Status: AC
  Filled 2013-06-06: qty 2

## 2013-06-06 MED ORDER — HEPARIN (PORCINE) IN NACL 2-0.9 UNIT/ML-% IJ SOLN
INTRAMUSCULAR | Status: AC
  Filled 2013-06-06: qty 500

## 2013-06-06 NOTE — Progress Notes (Signed)
TR BAND REMOVAL  LOCATION:  left radial  DEFLATED PER PROTOCOL:  yes  TIME BAND OFF / DRESSING APPLIED:   1751   SITE UPON ARRIVAL:   Level 0  SITE AFTER BAND REMOVAL:  Level 0  REVERSE ALLEN'S TEST:    positive  CIRCULATION SENSATION AND MOVEMENT:  Within Normal Limits  yes  COMMENTS:

## 2013-06-06 NOTE — Progress Notes (Signed)
ANTICOAGULATION CONSULT NOTE - Follow Up Consult  Pharmacy Consult for heparin Indication: chest pain/ACS  Labs:  Recent Labs  06/05/13 0445 06/05/13 1331 06/05/13 1908 06/05/13 2340  HGB 13.5 13.7  --   --   HCT 38.3* 38.7*  --   --   PLT 209 216  --   --   HEPARINUNFRC  --   --   --  0.33  CREATININE 0.95 0.86  --   --   TROPONINI <0.30 0.69* 0.71*  --     Assessment: 58yo male therapeutic on heparin with initial dosing though at low end of goal and had Lovenox at 1400; initial troponin negative though now increasing.  Goal of Therapy:  Heparin level 0.3-0.7 units/ml   Plan:  Will increase heparin gtt slightly to 1400 units/hr to help ensure stays above goal and check level in 6hr.  Wynona Neat, PharmD, BCPS  06/06/2013,12:59 AM

## 2013-06-06 NOTE — Interval H&P Note (Signed)
History and Physical Interval Note:  06/06/2013 10:10 AM  [name]  has presented today for surgery, with the diagnosis of cp  The various methods of treatment have been discussed with the patient and family. After consideration of risks, benefits and other options for treatment, the patient has consented to  Procedure(s): LEFT HEART CATHETERIZATION WITH CORONARY/GRAFT ANGIOGRAM (N/A) as a surgical intervention .  The patient's history has been reviewed, patient examined, no change in status, stable for surgery.  I have reviewed the patient's chart and labs.  Questions were answered to the patient's satisfaction.    Cath Lab Visit (complete for each Cath Lab visit)  Clinical Evaluation Leading to the Procedure:   ACS: yes  Non-ACS:    Anginal Classification: CCS IV  Anti-ischemic medical therapy: Minimal Therapy (1 class of medications)  Non-Invasive Test Results: No non-invasive testing performed  Prior CABG: Previous CABG       [name]

## 2013-06-06 NOTE — Care Management Note (Addendum)
    Page 1 of 1   06/07/2013     11:10:21 AM   CARE MANAGEMENT NOTE 06/07/2013  Patient:  [name], [name]   Account Number:  [id number]  Date Initiated:  06/06/2013  Documentation initiated by:  [facility]  Subjective/Objective Assessment:   58 yo male Active Problems:    CAD,  Chest pain at rest    NSTEMI (non-ST elevated myocardial infarction)//Home alone     Action/Plan:   Procedure: Left Heart Cath, Selective Coronary Angiography, LV angiography, LIMA angiography, SVG angiography, PTCA and stenting of the SVG-diagonal, PTCA and stenting of the SVG-PDA//Benefits check for Effient   Anticipated DC Date:  06/07/2013   Anticipated DC Plan:  HOME/SELF CARE      DC Planning Services  CM consult      Choice offered to / List presented to:             Status of service:   Medicare Important Message given?   (If response is "NO", the following Medicare IM given date fields will be blank) Date Medicare IM given:   Date Additional Medicare IM given:    Discharge Disposition:    Per UR Regulation:    If discussed at Long Length of Stay Meetings, dates discussed:    Comments:  06/07/13 0900 Fuller Mandril, RN, BSN, NCM [phone] Per rep at [facility]: no auth required, $18 co-pay at [employer] with pt at bedside regarding benefits check for Effient.  Pt has brochure with 30 day free card and refill assistance card intact.  Pt utilizes [facility] in [place] for prescription needs.  NCM called pharmacy to confirm availability of medication.  Information relayed to pt.  Pt verbalizes importance of filling medication upon discharge.  06/06/13 [place], RN, BSN, [place] [phone] Benefits check for Effient 10mg .  Pt utilizes [facility] in [place].

## 2013-06-06 NOTE — Progress Notes (Signed)
UR completed 

## 2013-06-06 NOTE — H&P (View-Only) (Signed)
Patient Name: [name] Date of Encounter: 06/06/2013     Active Problems:   CAD (coronary artery disease)   Chest pain at rest   NSTEMI (non-ST elevated myocardial infarction)    SUBJECTIVE  The patient is feeling well this morning.  Rhythm remains stable normal sinus rhythm with occasional PVC.  His cardiac enzymes show slight bump in troponins.  He will undergo cardiac catheterization today.  His LDL is above target and we will increase his Lipitor up to 80 mg daily.  CURRENT MEDS . aspirin EC  81 mg Oral Daily  . atorvastatin  40 mg Oral Daily  . metoprolol tartrate  12.5 mg Oral BID  . pantoprazole  40 mg Oral Daily  . sodium chloride  3 mL Intravenous Q12H  . sodium chloride  3 mL Intravenous Q12H    OBJECTIVE  Filed Vitals:   06/05/13 1200 06/05/13 1403 06/05/13 2100 06/06/13 0556  BP: 161/71 137/73 146/86 151/80  Pulse: 89 72 62 60  Temp: 98 F (36.7 C) 98.1 F (36.7 C) 98.6 F (37 C) 98.3 F (36.8 C)  TempSrc: Oral Oral Oral Oral  Resp:  18 16 16   Height:  5' 8.9" (1.75 m)    Weight:  242 lb 8.1 oz (110 kg)    SpO2: 97% 98% 97% 98%    Intake/Output Summary (Last 24 hours) at 06/06/13 0824 Last data filed at 06/05/13 1233  Gross per 24 hour  Intake    240 ml  Output      0 ml  Net    240 ml   Filed Weights   06/05/13 1403  Weight: 242 lb 8.1 oz (110 kg)    PHYSICAL EXAM  General: Pleasant, NAD. Neuro: Alert and oriented X 3. Moves all extremities spontaneously. Psych: Normal affect. HEENT:  Normal  Neck: Supple without bruits or JVD. Lungs:  Resp regular and unlabored, CTA. Heart: RRR no s3, s4, or murmurs. Abdomen: Soft, non-tender, non-distended, BS + x 4.  Extremities: No clubbing, cyanosis or edema. DP/PT/Radials 2+ and equal bilaterally.  Accessory Clinical Findings  CBC  Recent Labs  06/05/13 1331 06/06/13 0522  WBC 8.0 8.5  HGB 13.7 13.6  HCT 38.7* 39.0  MCV 87.0 87.8  PLT 216 643   Basic Metabolic  Panel  Recent Labs  06/05/13 0445 06/05/13 1331 06/06/13 0522  NA 141  --  140  K 3.9  --  4.3  CL 101  --  100  CO2 25  --  28  GLUCOSE 123*  --  102*  BUN 16  --  15  CREATININE 0.95 0.86 1.04  CALCIUM 9.0  --  9.1   Liver Function Tests No results found for this basename: AST, ALT, ALKPHOS, BILITOT, PROT, ALBUMIN,  in the last 72 hours No results found for this basename: LIPASE, AMYLASE,  in the last 72 hours Cardiac Enzymes  Recent Labs  06/05/13 1331 06/05/13 1908 06/05/13 2340  TROPONINI 0.69* 0.71* 0.46*   BNP No components found with this basename: POCBNP,  D-Dimer  Recent Labs  06/05/13 0445  DDIMER <0.27   Hemoglobin A1C  Recent Labs  06/05/13 1331  HGBA1C 5.5   Fasting Lipid Panel  Recent Labs  06/06/13 0522  CHOL 203*  HDL 51  LDLCALC 126*  TRIG 132  CHOLHDL 4.0   Thyroid Function Tests No results found for this basename: TSH, T4TOTAL, FREET3, T3FREE, THYROIDAB,  in the last 72 hours  TELE  Normal  sinus rhythm with occasional PVC  ECG  Normal sinus rhythm.  No ischemic changes.  Radiology/Studies  Dg Chest Port 1 View  06/05/2013   CLINICAL DATA:  Chest pain and shortness of breath since this morning.  EXAM: PORTABLE CHEST - 1 VIEW  COMPARISON:  DG CHEST 1V PORT dated 05/03/2011  FINDINGS: Cardiac silhouette appears upper limits of normal in size, mediastinal silhouette is nonsuspicious, status post median sternotomy. No pleural effusions or focal consolidations. No pneumothorax.  Soft tissue planes and included osseous structures are nonsuspicious. Multiple EKG lines overlie the patient and may obscure subtle underlying pathology.  IMPRESSION: Borderline cardiomegaly, no acute pulmonary process.   Electronically Signed   By: Elon Alas   On: 06/05/2013 04:29    ASSESSMENT AND PLAN 1.Chest Pain: Troponin was mildly positive.  For left heart catheterization and possible PCI today 2. CAD: S/P CABG with LIMA to LAD, SVG to OM,  SVG to RCA, Free RIMA to Diagonal. Most recent cath in Feb of 2013 revealing patient grafts and normal LV function of 55%. Continue ECASA 81 mg and metoprolol.  3. Hypertension: Has not been taking metoprolol at home, as he reports BP in the 981'X systolic. Will restart during hospitalization and monitor response.  4. Hypercholesterolemia: No recent documentation of lipid status since 2013.  LDL elevated.  Increase Lipitor to 80 mg daily.  5. GERD: Has been on Dexilant in the past with good result.  6. Obesity: Contributing to overall health status. Wt loss and exercise are strongly recommended. As stated, consideration for OSA work-up as OP at the discretion of PCP.    Signed, Darlin Coco MD

## 2013-06-06 NOTE — Progress Notes (Signed)
Patient Name: [name] Date of Encounter: 06/06/2013     Active Problems:   CAD (coronary artery disease)   Chest pain at rest   NSTEMI (non-ST elevated myocardial infarction)    SUBJECTIVE  The patient is feeling well this morning.  Rhythm remains stable normal sinus rhythm with occasional PVC.  His cardiac enzymes show slight bump in troponins.  He will undergo cardiac catheterization today.  His LDL is above target and we will increase his Lipitor up to 80 mg daily.  CURRENT MEDS . aspirin EC  81 mg Oral Daily  . atorvastatin  40 mg Oral Daily  . metoprolol tartrate  12.5 mg Oral BID  . pantoprazole  40 mg Oral Daily  . sodium chloride  3 mL Intravenous Q12H  . sodium chloride  3 mL Intravenous Q12H    OBJECTIVE  Filed Vitals:   06/05/13 1200 06/05/13 1403 06/05/13 2100 06/06/13 0556  BP: 161/71 137/73 146/86 151/80  Pulse: 89 72 62 60  Temp: 98 F (36.7 C) 98.1 F (36.7 C) 98.6 F (37 C) 98.3 F (36.8 C)  TempSrc: Oral Oral Oral Oral  Resp:  18 16 16   Height:  5' 8.9" (1.75 m)    Weight:  242 lb 8.1 oz (110 kg)    SpO2: 97% 98% 97% 98%    Intake/Output Summary (Last 24 hours) at 06/06/13 0824 Last data filed at 06/05/13 1233  Gross per 24 hour  Intake    240 ml  Output      0 ml  Net    240 ml   Filed Weights   06/05/13 1403  Weight: 242 lb 8.1 oz (110 kg)    PHYSICAL EXAM  General: Pleasant, NAD. Neuro: Alert and oriented X 3. Moves all extremities spontaneously. Psych: Normal affect. HEENT:  Normal  Neck: Supple without bruits or JVD. Lungs:  Resp regular and unlabored, CTA. Heart: RRR no s3, s4, or murmurs. Abdomen: Soft, non-tender, non-distended, BS + x 4.  Extremities: No clubbing, cyanosis or edema. DP/PT/Radials 2+ and equal bilaterally.  Accessory Clinical Findings  CBC  Recent Labs  06/05/13 1331 06/06/13 0522  WBC 8.0 8.5  HGB 13.7 13.6  HCT 38.7* 39.0  MCV 87.0 87.8  PLT 216 643   Basic Metabolic  Panel  Recent Labs  06/05/13 0445 06/05/13 1331 06/06/13 0522  NA 141  --  140  K 3.9  --  4.3  CL 101  --  100  CO2 25  --  28  GLUCOSE 123*  --  102*  BUN 16  --  15  CREATININE 0.95 0.86 1.04  CALCIUM 9.0  --  9.1   Liver Function Tests No results found for this basename: AST, ALT, ALKPHOS, BILITOT, PROT, ALBUMIN,  in the last 72 hours No results found for this basename: LIPASE, AMYLASE,  in the last 72 hours Cardiac Enzymes  Recent Labs  06/05/13 1331 06/05/13 1908 06/05/13 2340  TROPONINI 0.69* 0.71* 0.46*   BNP No components found with this basename: POCBNP,  D-Dimer  Recent Labs  06/05/13 0445  DDIMER <0.27   Hemoglobin A1C  Recent Labs  06/05/13 1331  HGBA1C 5.5   Fasting Lipid Panel  Recent Labs  06/06/13 0522  CHOL 203*  HDL 51  LDLCALC 126*  TRIG 132  CHOLHDL 4.0   Thyroid Function Tests No results found for this basename: TSH, T4TOTAL, FREET3, T3FREE, THYROIDAB,  in the last 72 hours  TELE  Normal  sinus rhythm with occasional PVC  ECG  Normal sinus rhythm.  No ischemic changes.  Radiology/Studies  Dg Chest Port 1 View  06/05/2013   CLINICAL DATA:  Chest pain and shortness of breath since this morning.  EXAM: PORTABLE CHEST - 1 VIEW  COMPARISON:  DG CHEST 1V PORT dated 05/03/2011  FINDINGS: Cardiac silhouette appears upper limits of normal in size, mediastinal silhouette is nonsuspicious, status post median sternotomy. No pleural effusions or focal consolidations. No pneumothorax.  Soft tissue planes and included osseous structures are nonsuspicious. Multiple EKG lines overlie the patient and may obscure subtle underlying pathology.  IMPRESSION: Borderline cardiomegaly, no acute pulmonary process.   Electronically Signed   By: Courtnay  Bloomer   On: 06/05/2013 04:29    ASSESSMENT AND PLAN 1.Chest Pain: Troponin was mildly positive.  For left heart catheterization and possible PCI today 2. CAD: S/P CABG with LIMA to LAD, SVG to OM,  SVG to RCA, Free RIMA to Diagonal. Most recent cath in Feb of 2013 revealing patient grafts and normal LV function of 55%. Continue ECASA 81 mg and metoprolol.  3. Hypertension: Has not been taking metoprolol at home, as he reports BP in the 120's systolic. Will restart during hospitalization and monitor response.  4. Hypercholesterolemia: No recent documentation of lipid status since 2013.  LDL elevated.  Increase Lipitor to 80 mg daily.  5. GERD: Has been on Dexilant in the past with good result.  6. Obesity: Contributing to overall health status. Wt loss and exercise are strongly recommended. As stated, consideration for OSA work-up as OP at the discretion of PCP.    Signed, Attallah Ontko MD  

## 2013-06-06 NOTE — Progress Notes (Signed)
Echocardiogram 2D Echocardiogram has been performed.  [name] 06/06/2013, 2:51 PM

## 2013-06-06 NOTE — CV Procedure (Signed)
Cardiac Catheterization Procedure Note  Name: [name] MRN: [id number] DOB: 07/25/1955  Procedure: Left Heart Cath, Selective Coronary Angiography, LV angiography, LIMA angiography, SVG angiography, PTCA and stenting of the SVG-diagonal, PTCA and stenting of the SVG-PDA  Indication: NSTEMI. Pt with known CAD, CABG in 2006, presents with rest pain and NSTEMI.   Procedural Details:  The left wrist was prepped, draped, and anesthetized with 1% lidocaine. Using the modified Seldinger technique, a 5 French sheath was introduced into the left radial artery. 3 mg of verapamil was administered through the sheath, weight-based unfractionated heparin was administered intravenously. Standard Judkins catheters were used for selective coronary angiography, bypass graft angiography, and left ventriculography. Catheter exchanges were performed over an exchange length guidewire.  PROCEDURAL FINDINGS Hemodynamics: AO 136/69 LV 141/21   Coronary angiography: Coronary dominance: right  Left mainstem: calcified without obstructive disease  Left anterior descending (LAD): diffuse proximal disease with 80% ostial stenosis and 100% occlusion at the first septal perforator  Left circumflex (LCx): hypodense eccentric 70% stenosis in the proximal circumflex. OM1 has 80% stenosis, OM2 with diffuse nonobstructive disease  Right coronary artery (RCA): diffusely diseased in the proximal RCA. Total 100% occlusion in the mid-vessel.  LIMA-LAD: patent without disease. The LAD is diffusely diseased, especially in the apical portion with 80% apical stenosis  Free RIMA - OM: patent without obstructive disease  SVG - diagonal: 95% stenosis in mid-body of graft  SVG - PDA: 95% stenosis in proximal body of graft  Left ventriculography: Left ventricular systolic function is normal, LVEF is estimated at 55-65%, there is no significant mitral regurgitation   PCI Note:  Following the diagnostic procedure, the  decision was made to proceed with PCI. I planned on performing PCI on both vein graft lesions. The vein graft to the diagonal appeared too small for distal protection. The patient was loaded with effient 60 mg on the table. Weight-based bivalirudin was given for anticoagulation. Once a therapeutic ACT was achieved, a 6 [place] AL-1 guide catheter was inserted.  IC verapamil was administered. A cougar coronary guidewire was used to cross the lesion.  The lesion was predilated with a 2.0 mm balloon.  The lesion was then stented with a 3.0x16 mm Promus DES stent.  The stent was postdilated with a 3.25 mm noncompliant balloon to 14 atm.  Following PCI, there was 0% residual stenosis and TIMI-3 flow. Attention was then turned to the SVG-PDA. A MPA catheter was used. The same cougar wire was advanced across the lesion. A 3 mm Spider embolic protection device was prepped using normal technique. The device however would not traverse the aortic arch and it pushed the guide out of position on several attempts despite guide catheter manipulations. The device was removed and IC verapamil was again administered. The lesion was predilated with the same 2.0 mm balloon and a 2.5x24 mm Promus DES was used to treat the lesion. The stent was deployed at 12 atm and post-dilated with a 2.75 mm balloon to 16 atm. There was 0% residual stenosis and TIMI-3 flow at the completion of the procedure. Final angiography confirmed an excellent result. The patient tolerated the procedure well. There were no immediate procedural complications. A TR band was used for radial hemostasis. The patient was transferred to the post catheterization recovery area for further monitoring.  PCI Data: LESION 1 Vessel - diagonal 1/Segment - mid-body of SVG Percent Stenosis (pre)  95 TIMI-flow 3 Stent 3.0x16 mm Promus DES Percent Stenosis (post) 0 TIMI-flow (  post) 3  LESION 2 Vessel - PDA/Segment - Proximal body of SVG Percent Stenosis (pre)   95 TIMI-flow 3 Stent 2.5x24 mm Promus DES Percent Stenosis (post) 0 TIMI-flow (post) 3  Final Conclusions:   1. Severe 3 vessel CAD with total occlusion of the LAD and RCA and severe stenosis of the LCx 2. S/P CABG with continued patency of the LIMA-LAD and free RIMA-OM 3. Severe stenoses of the SVG-PDA and SVG-diagonal, both treated successfully with PCI 4. Normal LV function   Recommendations:  ASA and effient at least 12 months. Anticipate d/c tomorrow if no complications.  [name] 06/06/2013, 12:08 PM

## 2013-06-07 ENCOUNTER — Telehealth: Payer: Self-pay | Admitting: Cardiovascular Disease

## 2013-06-07 ENCOUNTER — Encounter (HOSPITAL_COMMUNITY): Payer: Self-pay | Admitting: Physician Assistant

## 2013-06-07 DIAGNOSIS — I214 Non-ST elevation (NSTEMI) myocardial infarction: Secondary | ICD-10-CM

## 2013-06-07 DIAGNOSIS — E669 Obesity, unspecified: Secondary | ICD-10-CM | POA: Diagnosis present

## 2013-06-07 DIAGNOSIS — I25119 Atherosclerotic heart disease of native coronary artery with unspecified angina pectoris: Secondary | ICD-10-CM | POA: Diagnosis present

## 2013-06-07 LAB — BASIC METABOLIC PANEL
BUN: 13 mg/dL (ref 6–23)
CALCIUM: 9.5 mg/dL (ref 8.4–10.5)
CO2: 25 mEq/L (ref 19–32)
CREATININE: 0.91 mg/dL (ref 0.50–1.35)
Chloride: 101 mEq/L (ref 96–112)
GFR calc Af Amer: >90 mL/min (ref >90)
Glucose, Bld: 99 mg/dL (ref 70–99)
Potassium: 4.2 mEq/L (ref 3.7–5.3)
Sodium: 140 mEq/L (ref 137–147)

## 2013-06-07 LAB — HEPATIC FUNCTION PANEL
ALT: 27 U/L (ref 0–53)
AST: 22 U/L (ref 0–37)
Albumin: 3.8 g/dL (ref 3.5–5.2)
Alkaline Phosphatase: 72 U/L (ref 39–117)
BILIRUBIN TOTAL: 0.8 mg/dL (ref 0.3–1.2)
Total Protein: 7 g/dL (ref 6.0–8.3)

## 2013-06-07 LAB — CBC
HEMATOCRIT: 39.8 % (ref 39.0–52.0)
HEMOGLOBIN: 14.1 g/dL (ref 13.0–17.0)
MCH: 30.9 pg (ref 26.0–34.0)
MCHC: 35.4 g/dL (ref 30.0–36.0)
MCV: 87.3 fL (ref 78.0–100.0)
Platelets: 230 10*3/uL (ref 150–400)
RBC: 4.56 MIL/uL (ref 4.22–5.81)
RDW: 12.9 % (ref 11.5–15.5)
WBC: 9.6 10*3/uL (ref 4.0–10.5)

## 2013-06-07 MED ORDER — METOPROLOL TARTRATE 25 MG PO TABS
25.0000 mg | ORAL_TABLET | Freq: Two times a day (BID) | ORAL | Status: DC
  Administered 2013-06-07: 10:00:00 25 mg via ORAL

## 2013-06-07 MED ORDER — ATORVASTATIN CALCIUM 80 MG PO TABS: 80.0000 mg | ORAL_TABLET | Freq: Every day | ORAL | Status: DC

## 2013-06-07 MED ORDER — PRASUGREL HCL 10 MG PO TABS: 10.0000 mg | ORAL_TABLET | Freq: Every day | ORAL | Status: DC

## 2013-06-07 MED ORDER — METOPROLOL TARTRATE 25 MG PO TABS: 25.0000 mg | ORAL_TABLET | Freq: Two times a day (BID) | ORAL | Status: DC

## 2013-06-07 MED FILL — Sodium Chloride IV Soln 0.9%: INTRAVENOUS | Qty: 50 | Status: AC

## 2013-06-07 NOTE — Progress Notes (Signed)
Subjective:  Doing well this morning. Denies chest pain, chest pressure, dyspnea, or palpitations.  Objective:  Vital Signs in the last 24 hours: Temp:  [98 F (36.7 C)-98.3 F (36.8 C)] 98.3 F (36.8 C) (03/17 0737) Pulse Rate:  [53-72] 66 (03/17 0737) Resp:  [16-20] 20 (03/17 0737) BP: (117-182)/(53-102) 149/72 mmHg (03/17 0737) SpO2:  [95 %-100 %] 95 % (03/17 0737) Weight:  [243 lb 9.7 oz (110.5 kg)] 243 lb 9.7 oz (110.5 kg) (03/16 2356)  Intake/Output from previous day: 03/16 0701 - 03/17 0700 In: 1440 [P.O.:560; I.V.:880] Out: 500 [Urine:500]  Physical Exam: Pt is alert and oriented, NAD HEENT: normal Neck: JVP - normal Lungs: CTA bilaterally CV: RRR without murmur or gallop Abd: soft, NT, Positive BS, obese Ext: no C/C/E, distal pulses intact and equal, left radial site clear without evidence of ecchymosis or hematoma Skin: warm/dry no rash   Lab Results:  Recent Labs  06/05/13 1331 06/06/13 0522  WBC 8.0 8.5  HGB 13.7 13.6  PLT 216 222    Recent Labs  06/05/13 0445 06/05/13 1331 06/06/13 0522  NA 141  --  140  K 3.9  --  4.3  CL 101  --  100  CO2 25  --  28  GLUCOSE 123*  --  102*  BUN 16  --  15  CREATININE 0.95 0.86 1.04    Recent Labs  06/05/13 1908 06/05/13 2340  TROPONINI 0.71* 0.46*    Cardiac Studies: 2-D echocardiogram: Left ventricle: The cavity size was normal. Wall thickness was increased in a pattern of moderate LVH. Systolic function was normal. The estimated ejection fraction was in the range of 60% to 65%. Wall motion was normal; there were no regional wall motion abnormalities.  ------------------------------------------------------------ Aortic valve: Structurally normal valve. Cusp separation was normal. Doppler: Transvalvular velocity was within the normal range. There was no stenosis. No regurgitation.  ------------------------------------------------------------ Aorta: Aortic root: The aortic root was  normal in size. Ascending aorta: The ascending aorta was normal in size.  ------------------------------------------------------------ Mitral valve: Structurally normal valve. Leaflet separation was normal. Doppler: Transvalvular velocity was within the normal range. There was no evidence for stenosis. No regurgitation.  ------------------------------------------------------------ Left atrium: The atrium was normal in size.  ------------------------------------------------------------ Right ventricle: The cavity size was normal. Systolic function was normal.  ------------------------------------------------------------ Pulmonic valve: Poorly visualized. The valve appears to be grossly normal. Doppler: No significant regurgitation.  ------------------------------------------------------------ Tricuspid valve: The valve appears to be grossly normal. Doppler: Trivial regurgitation.  ------------------------------------------------------------ Right atrium: The atrium was normal in size.  ------------------------------------------------------------ Pericardium: There was no pericardial effusion.  Tele: Personally reviewed, sinus rhythm and sinus bradycardia. Rare single PVCs.  Assessment/Plan:  #1. Non-ST elevation MI. Patient with previous CABG. He underwent 2 vessel PCI yesterday involving the vein graft to diagonal and vein graft to PDA. Drug-eluting stent platform for utilized. He should remain on dual antiplatelet therapy with aspirin and effient for a minimum of 12 months. He can return to work next Monday at full duty.  #2. Hyperlipidemia. Lipids reviewed this admission with a total cholesterol 203, LDL 126. His atorvastatin was increased from 40 mg to 80 mg daily. Lifestyle modification was reviewed in detail with the patient.  #3. Hypertension. Would resume metoprolol 25 mg twice daily. He has his blood pressure checked frequently at work and reports systolic readings  generally in the 120s.  For followup, the patient will see Dr. Johnsie Cancel or his PA/NP within 2 weeks.  [name],  M.D. 06/07/2013, 7:42 AM

## 2013-06-07 NOTE — Progress Notes (Addendum)
CARDIAC REHAB PHASE I   PRE:  Rate/Rhythm: 60 SR  BP:  Supine:   Sitting: 149/72  Standing:    SaO2:   MODE:  Ambulation: 1000 ft   POST:  Rate/Rhythm: 72 SR  BP:  Supine:   Sitting: 154/81  Standing:    SaO2:  0800-0905 Pt tolerated ambulation well without c/o of cp or SOB. VS stable Completed MI and stent education with pt and daughter. Pt voices understanding. He agrees to [facility]. CRP in [place], will send referral.  Rodney Langton RN 06/07/2013 9:15 AM

## 2013-06-07 NOTE — Discharge Summary (Signed)
Discharge Summary   Patient ID: [name] MRN: [id number], DOB/AGE: [birth year] 58 y.o. Admit date: 06/05/2013 D/C date:     06/07/2013  Primary Care Provider: Leonides Grills, MD Primary Cardiologist: Johnsie Cancel - pt lives in [place] but prefers [place] office  Primary Discharge Diagnoses:  1. NSTEMI/CAD - this admission: severe stenoses of the SVG-PDA and SVG-diagonal, both treated successfully with PCI (DES x2) - s/p MI 2000 s/p PCI, CABG in 2006 (LIMA-LAD, RIMA-acute marginal, SVG-DX, SVG-RCA) 2. Hyperlipidemia 3. HTN 4. Obesity Body mass index is 36.08 kg/(m^2).  Secondary Discharge Diagnoses:  1. GERD 2. Hydrocele 3. Asthma  Hospital Course: [name] is a 58 y/o M with history of CAD s/p CABG in 2006 (LIMA-LAD, RIMA-acute marginal, SVG-DX, SVG-RCA), hypertension, hyperlipidemia, GERD and morbid obesity who presented to [facility] with complaints of chest pain, eventually ruling in for NSTEMI. He awoke around 2 am morning of admission with chest prressure described like "elephant sitting on my chest." He took NTG, but did not feel the tingling. Drinking water gave no relief. He finally called EMS and was given NTG in the field with temporary relief, but pain recurred again in ER, and was given second NTG. He had no further pain after this. He reported that he had not been taking his antihypertensive medications as directed (metoprolol 4m BID, as BP had been running 1588'Fsystolic at home per his report). EKG NSR non-specific T-wave abnormalities in lead I and V1 Pro-BNP 159. CXR negative for CHF or pulmonary edema. D-Dimer 0.27. Initial troponin wsa negative, but after he was admitted they peaked at 0.71. He was admitted and placed on IV heparin when troponins returned positive. He underwent LHC on 06/06/13 demonstrating: 1. Severe 3 vessel CAD with total occlusion of the LAD and RCA and severe stenosis of the LCx  2. S/P CABG with continued patency of the LIMA-LAD and  free RIMA-OM  3. Severe stenoses of the SVG-PDA and SVG-diagonal, both treated successfully with PCI (DES x2) 4. Normal LV function  ASA & Effient for at least 12 months was recommended (will be given 30 day card in addition to regular refills). 2D echo showed EF 60-65%, mod LVH, no RWMA. He is doing well this morning. Statin was increased, lifestyle modifications reinforced. Metoprolol 248mBID was resumed. Dr. CoBurt Knackleared him to return to work next Monday 06/13/13. Dr. CoBurt Knackas seen and examined the patient today and feels he is stable for discharge.  During this admit, Lipitor was increased from 40->8058mince LDL was 126. Consider outpt LFTs/lipids. Will add LFTs on to blood already in lab at discharge for baseline.  Discharge Vitals: Blood pressure 149/72, pulse 66, temperature 98.3 F (36.8 C), temperature source Oral, resp. rate 20, height 5' 8.9" (1.75 m), weight 243 lb 9.7 oz (110.5 kg), SpO2 95.00%.  Labs: Lab Results  Component Value Date   WBC 9.6 06/07/2013   HGB 14.1 06/07/2013   HCT 39.8 06/07/2013   MCV 87.3 06/07/2013   PLT 230 06/07/2013     Recent Labs Lab 06/07/13 0813  NA 140  K 4.2  CL 101  CO2 25  BUN 13  CREATININE 0.91  CALCIUM 9.5  GLUCOSE 99    Recent Labs  06/05/13 0445 06/05/13 1331 06/05/13 1908 06/05/13 2340  TROPONINI <0.30 0.69* 0.71* 0.46*   Lab Results  Component Value Date   CHOL 203* 06/06/2013   HDL 51 06/06/2013   LDLCALC 126* 06/06/2013   TRIG 132 06/06/2013  Lab Results  Component Value Date   DDIMER <0.27 06/05/2013    Diagnostic Studies/Procedures   2D Echo 06/06/13 Left ventricle: The cavity size was normal. Wall thickness was increased in a pattern of moderate LVH. Systolic function was normal. The estimated ejection fraction was in the range of 60% to 65%. Wall motion was normal; there were no regional wall motion abnormalities.  Dg Chest Port 1 View 06/05/2013   CLINICAL DATA:  Chest pain and shortness of breath  since this morning.  EXAM: PORTABLE CHEST - 1 VIEW  COMPARISON:  DG CHEST 1V PORT dated 05/03/2011  FINDINGS: Cardiac silhouette appears upper limits of normal in size, mediastinal silhouette is nonsuspicious, status post median sternotomy. No pleural effusions or focal consolidations. No pneumothorax.  Soft tissue planes and included osseous structures are nonsuspicious. Multiple EKG lines overlie the patient and may obscure subtle underlying pathology.  IMPRESSION: Borderline cardiomegaly, no acute pulmonary process.   Electronically Signed   By: Elon Alas   On: 06/05/2013 04:29   Cath 06/06/13 Cardiac Catheterization Procedure Note  Name: [name]  MRN: [id number]  DOB: 03/30/1955  Procedure: Left Heart Cath, Selective Coronary Angiography, LV angiography, LIMA angiography, SVG angiography, PTCA and stenting of the SVG-diagonal, PTCA and stenting of the SVG-PDA  Indication: NSTEMI. Pt with known CAD, CABG in 2006, presents with rest pain and NSTEMI.  Procedural Details: The left wrist was prepped, draped, and anesthetized with 1% lidocaine. Using the modified Seldinger technique, a 5 French sheath was introduced into the left radial artery. 3 mg of verapamil was administered through the sheath, weight-based unfractionated heparin was administered intravenously. Standard Judkins catheters were used for selective coronary angiography, bypass graft angiography, and left ventriculography. Catheter exchanges were performed over an exchange length guidewire.  PROCEDURAL FINDINGS  Hemodynamics:  AO 136/69  LV 141/21  Coronary angiography:  Coronary dominance: right  Left mainstem: calcified without obstructive disease  Left anterior descending (LAD): diffuse proximal disease with 80% ostial stenosis and 100% occlusion at the first septal perforator  Left circumflex (LCx): hypodense eccentric 70% stenosis in the proximal circumflex. OM1 has 80% stenosis, OM2 with diffuse nonobstructive  disease  Right coronary artery (RCA): diffusely diseased in the proximal RCA. Total 100% occlusion in the mid-vessel.  LIMA-LAD: patent without disease. The LAD is diffusely diseased, especially in the apical portion with 80% apical stenosis  Free RIMA - OM: patent without obstructive disease  SVG - diagonal: 95% stenosis in mid-body of graft  SVG - PDA: 95% stenosis in proximal body of graft  Left ventriculography: Left ventricular systolic function is normal, LVEF is estimated at 55-65%, there is no significant mitral regurgitation  PCI Note: Following the diagnostic procedure, the decision was made to proceed with PCI. I planned on performing PCI on both vein graft lesions. The vein graft to the diagonal appeared too small for distal protection. The patient was loaded with effient 60 mg on the table. Weight-based bivalirudin was given for anticoagulation. Once a therapeutic ACT was achieved, a 6 [place] AL-1 guide catheter was inserted. IC verapamil was administered. A cougar coronary guidewire was used to cross the lesion. The lesion was predilated with a 2.0 mm balloon. The lesion was then stented with a 3.0x16 mm Promus DES stent. The stent was postdilated with a 3.25 mm noncompliant balloon to 14 atm. Following PCI, there was 0% residual stenosis and TIMI-3 flow. Attention was then turned to the SVG-PDA. A MPA catheter was used. The same cougar wire  was advanced across the lesion. A 3 mm Spider embolic protection device was prepped using normal technique. The device however would not traverse the aortic arch and it pushed the guide out of position on several attempts despite guide catheter manipulations. The device was removed and IC verapamil was again administered. The lesion was predilated with the same 2.0 mm balloon and a 2.5x24 mm Promus DES was used to treat the lesion. The stent was deployed at 12 atm and post-dilated with a 2.75 mm balloon to 16 atm. There was 0% residual stenosis and TIMI-3  flow at the completion of the procedure. Final angiography confirmed an excellent result. The patient tolerated the procedure well. There were no immediate procedural complications. A TR band was used for radial hemostasis. The patient was transferred to the post catheterization recovery area for further monitoring.  PCI Data:  LESION 1  Vessel - diagonal 1/Segment - mid-body of SVG  Percent Stenosis (pre) 95  TIMI-flow 3  Stent 3.0x16 mm Promus DES  Percent Stenosis (post) 0  TIMI-flow (post) 3  LESION 2  Vessel - PDA/Segment - Proximal body of SVG  Percent Stenosis (pre) 95  TIMI-flow 3  Stent 2.5x24 mm Promus DES  Percent Stenosis (post) 0  TIMI-flow (post) 3  Final Conclusions:  1. Severe 3 vessel CAD with total occlusion of the LAD and RCA and severe stenosis of the LCx  2. S/P CABG with continued patency of the LIMA-LAD and free RIMA-OM  3. Severe stenoses of the SVG-PDA and SVG-diagonal, both treated successfully with PCI  4. Normal LV function  Recommendations:  ASA and effient at least 12 months. Anticipate d/c tomorrow if no complications.   Discharge Medications   Current Discharge Medication List    START taking these medications   Details  prasugrel (EFFIENT) 10 MG TABS tablet Take 1 tablet (10 mg total) by mouth daily. Qty: 30 tablet, Refills: 10      CONTINUE these medications which have CHANGED   Details  atorvastatin (LIPITOR) 80 MG tablet Take 1 tablet (80 mg total) by mouth daily. Qty: 30 tablet, Refills: 6    metoprolol tartrate (LOPRESSOR) 25 MG tablet Take 1 tablet (25 mg total) by mouth 2 (two) times daily. Qty: 60 tablet, Refills: 6      CONTINUE these medications which have NOT CHANGED   Details  aspirin EC 81 MG tablet Take 81 mg by mouth daily.    nitroGLYCERIN (NITROSTAT) 0.4 MG SL tablet Place 0.4 mg under the tongue every 5 (five) minutes as needed for chest pain.      Add'l 30 day Effient rx written to use with free assistance  card  Disposition   The patient will be discharged in stable condition to home. Discharge Orders   Future Appointments Provider Department Dept Phone   06/14/2013 2:00 PM Burtis Junes, NP [place] [facility] [phone]   07/18/2013 3:45 PM Rogene Houston, MD [place] [phone]   Future Orders Complete By Expires   Amb Referral to Cardiac Rehabilitation  As directed    Diet - low sodium heart healthy  As directed    Increase activity slowly  As directed    Scheduling Instructions:     No driving for 1 week. No lifting over 10 lbs for 2 weeks. No sexual activity for 2 weeks. You may return to work on  06/13/13. Keep procedure site clean & dry. If you notice increased pain, swelling, bleeding or  pus, call/return!  You may shower, but no soaking baths/hot tubs/pools for 1 week.     Follow-up Information   Follow up with Truitt Merle, NP. (06/14/13 at 2pm - you are scheduled for a post-heart-attack transition-of-care visit, which are typically only slotted for 2pm)    Specialty:  Nurse Practitioner   Contact information:   [address] [place] [address] [phone]         Duration of Discharge Encounter: Greater than 30 minutes including physician and PA time.  Signed, Melina Copa PA-C 06/07/2013, 9:31 AM

## 2013-06-07 NOTE — Telephone Encounter (Signed)
New message    TCM with Tera Helper per Melina Copa PA.   appt on 3/24 @ 2:00 pm

## 2013-06-08 NOTE — Telephone Encounter (Signed)
Called TCM.  D/c from hospital yesterday after heart cath and 2 stents.  States he feels fine. No c/o of CP since first night in hospital.  States he has all of his medications and understands when to take the meds.  Reviewed medications with him.  States his (L) arm site is sore but no redness or swelling.  No questions regarding his care.  Is aware of his appointment with Kathrene Alu for 3/24 at 2:00 pm.  Will call if has any questions or concerns.

## 2013-06-14 ENCOUNTER — Encounter: Payer: 59 | Admitting: Nurse Practitioner

## 2013-06-16 ENCOUNTER — Encounter: Payer: 59 | Admitting: Adult Health

## 2013-06-28 ENCOUNTER — Ambulatory Visit (INDEPENDENT_AMBULATORY_CARE_PROVIDER_SITE_OTHER): Payer: 59 | Admitting: Nurse Practitioner

## 2013-06-28 ENCOUNTER — Encounter: Payer: Self-pay | Admitting: Nurse Practitioner

## 2013-06-28 VITALS — BP 139/82 | HR 55 | Ht 69.0 in | Wt 251.0 lb

## 2013-06-28 DIAGNOSIS — I2510 Atherosclerotic heart disease of native coronary artery without angina pectoris: Secondary | ICD-10-CM

## 2013-06-28 DIAGNOSIS — I10 Essential (primary) hypertension: Secondary | ICD-10-CM

## 2013-06-28 DIAGNOSIS — E785 Hyperlipidemia, unspecified: Secondary | ICD-10-CM

## 2013-06-28 DIAGNOSIS — E669 Obesity, unspecified: Secondary | ICD-10-CM

## 2013-06-28 DIAGNOSIS — I214 Non-ST elevation (NSTEMI) myocardial infarction: Secondary | ICD-10-CM

## 2013-06-28 NOTE — Progress Notes (Signed)
Patient Name: [name] Date of Encounter: 06/28/2013  Primary Care Provider:  Leonides Grills, MD Primary Cardiologist:  Jerilynn Mages. Burt Knack, MD (pt request - prev P. Johnsie Cancel, MD)  Patient Profile  58 year old male who presents for followup after recent non-ST segment elevation myocardial infarction and percutaneous intervention.  Problem List   Past Medical History  Diagnosis Date  . CAD (coronary artery disease) 2000    a. History of MI ~2000 s/p PCI. b. CABG in 2006 (LIMA-LAD, RIMA-acute marginal, SVG-DX, SVG-RCA). c. NSTEMI 05/2013:severe stenoses of the SVG-PDA and SVG-diagonal, both treated successfully with PCI (DES x2).  Marland Kitchen History of hypertension   . Hyperlipidemia   . Obesity   . Hydrocele 2012  . Hypertension   . GERD (gastroesophageal reflux disease)   . Asthma    Past Surgical History  Procedure Laterality Date  . Coronary artery bypass grafting  May 2006    Four-vessel  . Pilonidal cyst excision    . Tonsillectomy    . Foot fracture surgery      right  . Cataract extraction w/phaco Left 05/17/2012    Procedure: CATARACT EXTRACTION PHACO AND INTRAOCULAR LENS PLACEMENT (IOC);  Surgeon: Tonny Branch, MD;  Location: [facility];  Service: Ophthalmology;  Laterality: Left;  CDE:  0.76  . Coronary angioplasty  2000  . Coronary artery bypass graft  2006    4 vessels  . Cataract extraction w/phaco Right 08/26/2012    Procedure: CATARACT EXTRACTION PHACO AND INTRAOCULAR LENS PLACEMENT (IOC);  Surgeon: Tonny Branch, MD;  Location: [facility];  Service: Ophthalmology;  Laterality: Right;  CDE: 1.13    Allergies  No Known Allergies  HPI  58 year old male with prior history of coronary artery disease status post coronary artery bypass grafting in 2006.  He was [facility] to [facility] on [facility] 15 with complaints of substernal chest discomfort.  He ruled in for non-ST segment elevation myocardial infarction and underwent diagnostic catheterization on March 16 revealing severe native  three-vessel disease with severe stenoses within the vein graft to the PDA and vein graft to the diagonal.  The LIMA to the LAD and free RIMA->OM were patent.  The vein grafts to the PDA and diagonal were successfully treated with drug-eluting stents.  Echocardiogram showed normal LV function with an EF of 60-65%.  Patient was discharged home on aspirin, effient, bb, and increased dose statin therapy.  Since discharge, he has done well without recurrence of chest pain or dyspnea.  He is already back at work.  He denies PND, orthopnea, dizziness, syncope, palpitations, chest pain, edema, or early satiety.  Home Medications  Prior to Admission medications   Medication Sig Start Date End Date Taking? Authorizing Provider  aspirin EC 81 MG tablet Take 81 mg by mouth daily.   Yes Historical Provider, MD  atorvastatin (LIPITOR) 80 MG tablet Take 1 tablet (80 mg total) by mouth daily. 06/07/13  Yes Dayna N Dunn, PA-C  metoprolol tartrate (LOPRESSOR) 25 MG tablet Take 1 tablet (25 mg total) by mouth 2 (two) times daily. 06/07/13  Yes Dayna N Dunn, PA-C  nitroGLYCERIN (NITROSTAT) 0.4 MG SL tablet Place 0.4 mg under the tongue every 5 (five) minutes as needed for chest pain.   Yes Historical Provider, MD  prasugrel (EFFIENT) 10 MG TABS tablet Take 1 tablet (10 mg total) by mouth daily. 06/07/13  Yes Dayna N Dunn, PA-C    Review of Systems  As above, he is doing well without chest pain or dyspnea.  All other systems reviewed and are otherwise negative except as noted above.  Physical Exam  Blood pressure 139/82, pulse 55, height 5\' 9"  (1.753 m), weight 251 lb (113.853 kg).  General: Pleasant, NAD Psych: Normal affect. Neuro: Alert and oriented X 3. Moves all extremities spontaneously. HEENT: Normal  Neck: Supple without bruits or JVD. Lungs:  Resp regular and unlabored, CTA. Heart: RRR no s3, s4, or murmurs. Abdomen: Soft, non-tender, non-distended, BS + x 4.  Extremities: No clubbing, cyanosis or  edema. DP/PT/Radials 2+ and equal bilaterally.  Left radial catheterization site is without bleeding, bruit, or hematoma.  Accessory Clinical Findings  ECG - sinus bradycardia, 55, resolution of previous T-wave inversion in one and aVL.  Assessment & Plan  1. Non-ST elevation MI, subsequent episode of care/CAD: Patient status post non-STEMI with successful PCI and stenting of the vein grafts to the PDA and diagonal.  Patient has been doing well since discharge and is already back at work.  He remains on aspirin, effient, beta blocker, and statin therapy.  2.  Hypertension: Stable on beta blocker.  3.  Hyperlipidemia:LDL was 126 while hospitalized.  His statin dose was increased to 80 mg daily.  He'll need followup lipids and LFTs in approximately 6 weeks.  4.  Morbid obesity: Patient says he's been walking 2 miles a day and is able to complete this in about 35-40 minutes.  He would like to lose about a pound a week.  He has not lost any weight so far.  We discussed the benefits of calorie counting and reasonable goals.  5.  Disposition: Patient will followup in approximately 2 months.  Murray Hodgkins, NP 06/28/2013, 11:00 AM

## 2013-06-28 NOTE — Patient Instructions (Addendum)
Your physician recommends that you schedule a follow-up appointment in:  [place] recommends that you return for LAB work (LIVER FUNCTION) in: [place].  Your physician recommends that you continue on your current medications as directed. Please refer to the Current Medication list given to you today.

## 2013-06-29 ENCOUNTER — Encounter: Payer: 59 | Admitting: Physician Assistant

## 2013-07-04 ENCOUNTER — Encounter: Payer: Self-pay | Admitting: Cardiovascular Disease

## 2013-07-18 ENCOUNTER — Ambulatory Visit (INDEPENDENT_AMBULATORY_CARE_PROVIDER_SITE_OTHER): Payer: 59 | Admitting: Internal Medicine

## 2013-07-18 ENCOUNTER — Encounter (INDEPENDENT_AMBULATORY_CARE_PROVIDER_SITE_OTHER): Payer: Self-pay | Admitting: Internal Medicine

## 2013-07-18 ENCOUNTER — Encounter (INDEPENDENT_AMBULATORY_CARE_PROVIDER_SITE_OTHER): Payer: Self-pay | Admitting: *Deleted

## 2013-07-18 VITALS — BP 112/68 | HR 60 | Temp 98.1°F | Resp 18 | Ht 69.0 in | Wt 249.9 lb

## 2013-07-18 DIAGNOSIS — K7689 Other specified diseases of liver: Secondary | ICD-10-CM

## 2013-07-18 DIAGNOSIS — K219 Gastro-esophageal reflux disease without esophagitis: Secondary | ICD-10-CM

## 2013-07-18 DIAGNOSIS — K760 Fatty (change of) liver, not elsewhere classified: Secondary | ICD-10-CM

## 2013-07-18 NOTE — Patient Instructions (Signed)
Call if reflux symptoms not controlled with dietary measures

## 2013-07-18 NOTE — Progress Notes (Signed)
Presenting complaint;   Follow for GERD and fatty liver.  Subjective:  Patient is 58 year old Caucasian male who presents for yearly visit. He states he stopped Dexilant over 6 weeks ago and not having any problems. He is watching his diet very closely. He states he's changed his eating habits drastically since suffering MI one month ago leading to placement of 2 coronary stents. He states he woke up around 2 AM with chest pain and nausea or weeks ago. He did not respond to OTC antacids or nitroglycerin but believes nitroglycerin had expired. He hasn't had any chest pain since this cardiac intervention. He denies heartburn regurgitation sore throat cough or hoarseness. He also denies dysphagia abdominal pain melena or rectal bleeding. He rarely takes OTC ibuprofen. He is walking two miles daily. His goal is to lose 3-4 pounds every month. His last screening colonoscopy was in 2006.   Current Medications: Outpatient Encounter Prescriptions as of 07/18/2013  Medication Sig  . aspirin EC 81 MG tablet Take 81 mg by mouth daily.  Marland Kitchen atorvastatin (LIPITOR) 80 MG tablet Take 1 tablet (80 mg total) by mouth daily.  . metoprolol tartrate (LOPRESSOR) 25 MG tablet Take 1 tablet (25 mg total) by mouth 2 (two) times daily.  . nitroGLYCERIN (NITROSTAT) 0.4 MG SL tablet Place 0.4 mg under the tongue every 5 (five) minutes as needed for chest pain.  . prasugrel (EFFIENT) 10 MG TABS tablet Take 1 tablet (10 mg total) by mouth daily.     Objective: Blood pressure 112/68, pulse 60, temperature 98.1 F (36.7 C), temperature source Oral, resp. rate 18, height 5\' 9"  (1.753 m), weight 249 lb 14.4 oz (113.354 kg). Patient is alert and in no acute distress. Conjunctiva is pink. Sclera is nonicteric Oropharyngeal mucosa is normal. No neck masses or thyromegaly noted. Cardiac exam with regular rhythm normal S1 and S2. No murmur or gallop noted. Lungs are clear to auscultation. Abdomen is full but soft and  nontender without organomegaly or masses.  No LE edema or clubbing noted.  Labs/studies Results: Lab data from 06/07/2013. WBC 9.6, H&H 14.1 and 39.8 and platelet count 230K. Bilirubin 0.8, AP 72, AST 22, ALT 27, total protein 7.0 and albumin 3.8.   Assessment:  #1. GERD. Patient has been off PPI for more than 6 weeks and repeat remains free of symptoms with dietary measures. If symptoms relapse or he has heartburn more than 3 times a week he will need to go back on PPI. #2. Fatty liver. Transaminases remain normal. He does not have stigmata of chronic liver disease and so far fatty liver does not appear to be clinical issue. He is trying his best to lose weight which would also improve fatty liver.   Plan:  Continue anti-reflux measures. Call if symptoms relapse. Patient advised not to take OTC NSAIDs. He can take Tylenol on an as-needed basis but normal in 2 g in a given day. Office visit in one year.

## 2013-08-03 DIAGNOSIS — M25561 Pain in right knee: Secondary | ICD-10-CM | POA: Insufficient documentation

## 2013-08-24 ENCOUNTER — Other Ambulatory Visit: Payer: 59

## 2013-08-24 ENCOUNTER — Ambulatory Visit: Payer: 59 | Admitting: Cardiovascular Disease

## 2013-08-26 ENCOUNTER — Ambulatory Visit: Payer: 59 | Admitting: Cardiovascular Disease

## 2013-08-26 ENCOUNTER — Other Ambulatory Visit: Payer: 59

## 2013-08-30 ENCOUNTER — Telehealth: Payer: Self-pay | Admitting: Cardiovascular Disease

## 2013-08-30 DIAGNOSIS — E785 Hyperlipidemia, unspecified: Secondary | ICD-10-CM

## 2013-08-30 NOTE — Telephone Encounter (Signed)
I spoke with the pt and scheduled him to see Dr Burt Knack on 10/20/13.  I made the pt aware that he needs to go ahead and have lipid and liver function checked this week to follow-up on medications.

## 2013-08-30 NOTE — Telephone Encounter (Signed)
New Message  Pt called upset that Dr. Burt Knack had not available appts. Offered PA or NP pt declined. Pt had an appt for 08/24/2013 and he canceled the appt because he was going on vacation. He is requesting a call back to discuss a soon appt with Dr. Burt Knack. Please assist

## 2013-09-02 ENCOUNTER — Other Ambulatory Visit (INDEPENDENT_AMBULATORY_CARE_PROVIDER_SITE_OTHER): Payer: 59

## 2013-09-02 DIAGNOSIS — E785 Hyperlipidemia, unspecified: Secondary | ICD-10-CM

## 2013-09-02 LAB — LIPID PANEL
CHOLESTEROL: 109 mg/dL (ref 0–200)
HDL: 42.9 mg/dL (ref >39.00)
LDL Cholesterol: 44 mg/dL (ref 0–99)
NonHDL: 66.1
TRIGLYCERIDES: 111 mg/dL (ref 0.0–149.0)
Total CHOL/HDL Ratio: 3
VLDL: 22.2 mg/dL (ref 0.0–40.0)

## 2013-09-02 LAB — HEPATIC FUNCTION PANEL
ALT: 22 U/L (ref 0–53)
AST: 21 U/L (ref 0–37)
Albumin: 4.2 g/dL (ref 3.5–5.2)
Alkaline Phosphatase: 84 U/L (ref 39–117)
BILIRUBIN DIRECT: 0.1 mg/dL (ref 0.0–0.3)
BILIRUBIN TOTAL: 0.7 mg/dL (ref 0.2–1.2)
Total Protein: 7.1 g/dL (ref 6.0–8.3)

## 2013-09-07 ENCOUNTER — Other Ambulatory Visit: Payer: 59

## 2013-10-20 ENCOUNTER — Ambulatory Visit (INDEPENDENT_AMBULATORY_CARE_PROVIDER_SITE_OTHER): Payer: 59 | Admitting: Cardiovascular Disease

## 2013-10-20 ENCOUNTER — Encounter: Payer: Self-pay | Admitting: Cardiovascular Disease

## 2013-10-20 VITALS — BP 150/84 | HR 60 | Ht 69.0 in | Wt 239.0 lb

## 2013-10-20 DIAGNOSIS — E785 Hyperlipidemia, unspecified: Secondary | ICD-10-CM

## 2013-10-20 DIAGNOSIS — I2510 Atherosclerotic heart disease of native coronary artery without angina pectoris: Secondary | ICD-10-CM

## 2013-10-20 MED ORDER — ATORVASTATIN CALCIUM 40 MG PO TABS: 40.0000 mg | ORAL_TABLET | Freq: Every day | ORAL | Status: DC

## 2013-10-20 NOTE — Patient Instructions (Signed)
Your physician has recommended you make the following change in your medication:  DECREASE LIPITOR TO 40 MG DAILY  Your physician wants you to follow-up in: [place]. You will receive a reminder letter in the mail two months in advance. If you don't receive a letter, please call our office to schedule the follow-up appointment.  PLEASE HAVE LAB WORK COMPLETED BEFORE YOU COME BACK FOR APPOINTMENT WITH [address], PA-C

## 2013-10-20 NOTE — Progress Notes (Signed)
HPI:   58 year old gentleman presenting for followup of coronary artery disease. The patient underwent multivessel CABG in 2006. In March of this year he presented with non-ST elevation infarction and was found to have severe disease in the vein graft to PDA and vein graft to diagonal branch. The LIMA to LAD and free RIMA to OM were patent. He underwent PCI of both vein grafts using drug-eluting stents. His LV ejection fraction was preserved at 60-65%. He presents today for followup evaluation.  BP is elevated today but he has it checked regularly by the nurse at his workplace and he reports BP is always less than 140/90. He remains asymptomatic and reports no recurrence of chest pain or pressure since his PCI procedure. No shortness of breath.   Outpatient Encounter Prescriptions as of 10/20/2013  Medication Sig  . aspirin EC 81 MG tablet Take 81 mg by mouth daily.  Marland Kitchen atorvastatin (LIPITOR) 80 MG tablet Take 1 tablet (80 mg total) by mouth daily.  . metoprolol tartrate (LOPRESSOR) 25 MG tablet Take 1 tablet (25 mg total) by mouth 2 (two) times daily.  . nitroGLYCERIN (NITROSTAT) 0.4 MG SL tablet Place 0.4 mg under the tongue every 5 (five) minutes as needed for chest pain.  . prasugrel (EFFIENT) 10 MG TABS tablet Take 1 tablet (10 mg total) by mouth daily.    No Known Allergies  Past Medical History  Diagnosis Date  . CAD (coronary artery disease) 2000    a. History of MI ~2000 s/p PCI. b. CABG in 2006 (LIMA-LAD, RIMA-acute marginal, SVG-DX, SVG-RCA). c. NSTEMI 05/2013:severe stenoses of the SVG-PDA and SVG-diagonal, both treated successfully with PCI (DES x2).  Marland Kitchen History of hypertension   . Hyperlipidemia   . Obesity   . Hydrocele 2012  . Hypertension   . GERD (gastroesophageal reflux disease)   . Asthma     BP 150/84  Pulse 60  Ht _0  (1.753 m)  Wt 239 lb (108.41 kg)  BMI 35.28 kg/m2  PHYSICAL EXAM: Pt is alert and oriented, NAD HEENT: normal Neck: JVP - normal,  carotids 2+= without bruits Lungs: CTA bilaterally CV: RRR without murmur or gallop Abd: soft, NT, Positive BS, no hepatomegaly Ext: no C/C/E, distal pulses intact and equal Skin: warm/dry no rash  2D Echo: 06/06/2013: Study Conclusions  Left ventricle: The cavity size was normal. Wall thickness was increased in a pattern of moderate LVH. Systolic function was normal. The estimated ejection fraction was in the range of 60% to 65%. Wall motion was normal; there were no regional wall motion abnormalities.  Cardiac Cath 06/06/2013: PROCEDURAL FINDINGS  Hemodynamics:  AO 136/69  LV 141/21  Coronary angiography:  Coronary dominance: right  Left mainstem: calcified without obstructive disease  Left anterior descending (LAD): diffuse proximal disease with 80% ostial stenosis and 100% occlusion at the first septal perforator  Left circumflex (LCx): hypodense eccentric 70% stenosis in the proximal circumflex. OM1 has 80% stenosis, OM2 with diffuse nonobstructive disease  Right coronary artery (RCA): diffusely diseased in the proximal RCA. Total 100% occlusion in the mid-vessel.  LIMA-LAD: patent without disease. The LAD is diffusely diseased, especially in the apical portion with 80% apical stenosis  Free RIMA - OM: patent without obstructive disease  SVG - diagonal: 95% stenosis in mid-body of graft  SVG - PDA: 95% stenosis in proximal body of graft  Left ventriculography: Left ventricular systolic function is normal, LVEF is estimated at 55-65%, there is no significant mitral regurgitation  PCI  Note: Following the diagnostic procedure, the decision was made to proceed with PCI. I planned on performing PCI on both vein graft lesions. The vein graft to the diagonal appeared too small for distal protection. The patient was loaded with effient 60 mg on the table. Weight-based bivalirudin was given for anticoagulation. Once a therapeutic ACT was achieved, a 6 [place] AL-1 guide catheter was  inserted. IC verapamil was administered. A cougar coronary guidewire was used to cross the lesion. The lesion was predilated with a 2.0 mm balloon. The lesion was then stented with a 3.0x16 mm Promus DES stent. The stent was postdilated with a 3.25 mm noncompliant balloon to 14 atm. Following PCI, there was 0% residual stenosis and TIMI-3 flow. Attention was then turned to the SVG-PDA. A MPA catheter was used. The same cougar wire was advanced across the lesion. A 3 mm Spider embolic protection device was prepped using normal technique. The device however would not traverse the aortic arch and it pushed the guide out of position on several attempts despite guide catheter manipulations. The device was removed and IC verapamil was again administered. The lesion was predilated with the same 2.0 mm balloon and a 2.5x24 mm Promus DES was used to treat the lesion. The stent was deployed at 12 atm and post-dilated with a 2.75 mm balloon to 16 atm. There was 0% residual stenosis and TIMI-3 flow at the completion of the procedure. Final angiography confirmed an excellent result. The patient tolerated the procedure well. There were no immediate procedural complications. A TR band was used for radial hemostasis. The patient was transferred to the post catheterization recovery area for further monitoring.  PCI Data:  LESION 1  Vessel - diagonal 1/Segment - mid-body of SVG  Percent Stenosis (pre) 95  TIMI-flow 3  Stent 3.0x16 mm Promus DES  Percent Stenosis (post) 0  TIMI-flow (post) 3  LESION 2  Vessel - PDA/Segment - Proximal body of SVG  Percent Stenosis (pre) 95  TIMI-flow 3  Stent 2.5x24 mm Promus DES  Percent Stenosis (post) 0  TIMI-flow (post) 3  Final Conclusions:  1. Severe 3 vessel CAD with total occlusion of the LAD and RCA and severe stenosis of the LCx  2. S/P CABG with continued patency of the LIMA-LAD and free RIMA-OM  3. Severe stenoses of the SVG-PDA and SVG-diagonal, both treated successfully  with PCI  4. Normal LV function  Recommendations:  ASA and effient at least 12 months. Anticipate d/c tomorrow if no complications.  [name]  06/06/2013, 12:08 PM  Lipid Panel     Component Value Date/Time   CHOL 109 09/02/2013 0740   TRIG 111.0 09/02/2013 0740   HDL 42.90 09/02/2013 0740   CHOLHDL 3 09/02/2013 0740   VLDL 22.2 09/02/2013 0740   LDLCALC 44 09/02/2013 0740   ASSESSMENT AND PLAN: 1. Coronary artery disease, native vessel and graft disease. The patient is status post non-ST elevation infarction March 2015. He is doing well on medical therapy with no recurrence of anginal symptoms. Recommended continuation of dual antiplatelet therapy in followup in 6 months with Richardson Dopp. I will plan on seeing him back in one year.  2. Hyperlipidemia. Lipids reviewed as above. Patient is having some mild myalgias. Decrease Lipitor to 40 mg daily.  3. Hypertension. Home blood pressure readings have been good. Continue current medical program.  [name] 10/20/2013 4:45 PM

## 2013-10-21 ENCOUNTER — Encounter: Payer: Self-pay | Admitting: Cardiovascular Disease

## 2014-03-02 ENCOUNTER — Encounter (HOSPITAL_COMMUNITY): Payer: Self-pay | Admitting: Cardiovascular Disease

## 2014-03-11 ENCOUNTER — Other Ambulatory Visit: Payer: Self-pay | Admitting: Physician Assistant

## 2014-03-22 ENCOUNTER — Encounter (INDEPENDENT_AMBULATORY_CARE_PROVIDER_SITE_OTHER): Payer: Self-pay | Admitting: *Deleted

## 2014-05-24 ENCOUNTER — Other Ambulatory Visit: Payer: Self-pay | Admitting: Cardiovascular Disease

## 2014-06-28 ENCOUNTER — Encounter (HOSPITAL_COMMUNITY): Payer: Self-pay | Admitting: Emergency Medicine

## 2014-06-28 ENCOUNTER — Emergency Department (HOSPITAL_COMMUNITY)
Admission: EM | Admit: 2014-06-28 | Discharge: 2014-06-28 | Disposition: A | Discharge status: Home / Self Care | Payer: 59 | Attending: Emergency Medicine | Admitting: Emergency Medicine

## 2014-06-28 ENCOUNTER — Emergency Department (HOSPITAL_COMMUNITY): Payer: 59

## 2014-06-28 DIAGNOSIS — N201 Calculus of ureter: Secondary | ICD-10-CM | POA: Insufficient documentation

## 2014-06-28 DIAGNOSIS — R109 Unspecified abdominal pain: Secondary | ICD-10-CM

## 2014-06-28 DIAGNOSIS — J45909 Unspecified asthma, uncomplicated: Secondary | ICD-10-CM | POA: Insufficient documentation

## 2014-06-28 DIAGNOSIS — E785 Hyperlipidemia, unspecified: Secondary | ICD-10-CM | POA: Diagnosis not present

## 2014-06-28 DIAGNOSIS — Z7982 Long term (current) use of aspirin: Secondary | ICD-10-CM | POA: Insufficient documentation

## 2014-06-28 DIAGNOSIS — I2510 Atherosclerotic heart disease of native coronary artery without angina pectoris: Secondary | ICD-10-CM | POA: Diagnosis not present

## 2014-06-28 DIAGNOSIS — Z79899 Other long term (current) drug therapy: Secondary | ICD-10-CM | POA: Insufficient documentation

## 2014-06-28 DIAGNOSIS — Z9861 Coronary angioplasty status: Secondary | ICD-10-CM | POA: Insufficient documentation

## 2014-06-28 DIAGNOSIS — Z950 Presence of cardiac pacemaker: Secondary | ICD-10-CM | POA: Insufficient documentation

## 2014-06-28 DIAGNOSIS — E669 Obesity, unspecified: Secondary | ICD-10-CM | POA: Insufficient documentation

## 2014-06-28 DIAGNOSIS — R1030 Lower abdominal pain, unspecified: Secondary | ICD-10-CM | POA: Diagnosis present

## 2014-06-28 DIAGNOSIS — I10 Essential (primary) hypertension: Secondary | ICD-10-CM | POA: Diagnosis not present

## 2014-06-28 LAB — COMPREHENSIVE METABOLIC PANEL
ALBUMIN: 4.5 g/dL (ref 3.5–5.2)
ALT: 23 U/L (ref 0–53)
ANION GAP: 10 (ref 5–15)
AST: 28 U/L (ref 0–37)
Alkaline Phosphatase: 90 U/L (ref 39–117)
BUN: 19 mg/dL (ref 6–23)
CHLORIDE: 98 mmol/L (ref 96–112)
CO2: 26 mmol/L (ref 19–32)
Calcium: 9.5 mg/dL (ref 8.4–10.5)
Creatinine, Ser: 1.15 mg/dL (ref 0.50–1.35)
GFR calc non Af Amer: 68 mL/min — ABNORMAL LOW (ref >90)
GFR, EST AFRICAN AMERICAN: 79 mL/min — AB (ref >90)
Glucose, Bld: 133 mg/dL — ABNORMAL HIGH (ref 70–99)
Potassium: 4.2 mmol/L (ref 3.5–5.1)
Sodium: 134 mmol/L — ABNORMAL LOW (ref 135–145)
Total Bilirubin: 1 mg/dL (ref 0.3–1.2)
Total Protein: 8 g/dL (ref 6.0–8.3)

## 2014-06-28 LAB — URINALYSIS, ROUTINE W REFLEX MICROSCOPIC
Bilirubin Urine: NEGATIVE
GLUCOSE, UA: NEGATIVE mg/dL
KETONES UR: NEGATIVE mg/dL
LEUKOCYTES UA: NEGATIVE
NITRITE: NEGATIVE
PH: 5.5 (ref 5.0–8.0)
Protein, ur: NEGATIVE mg/dL
Urobilinogen, UA: 0.2 mg/dL (ref 0.0–1.0)

## 2014-06-28 LAB — CBC WITH DIFFERENTIAL/PLATELET
Basophils Absolute: 0 10*3/uL (ref 0.0–0.1)
Basophils Relative: 0 % (ref 0–1)
EOS ABS: 0 10*3/uL (ref 0.0–0.7)
Eosinophils Relative: 0 % (ref 0–5)
HCT: 41.3 % (ref 39.0–52.0)
HEMOGLOBIN: 14.1 g/dL (ref 13.0–17.0)
Lymphocytes Relative: 9 % — ABNORMAL LOW (ref 12–46)
Lymphs Abs: 1.3 10*3/uL (ref 0.7–4.0)
MCH: 30.3 pg (ref 26.0–34.0)
MCHC: 34.1 g/dL (ref 30.0–36.0)
MCV: 88.8 fL (ref 78.0–100.0)
MONOS PCT: 8 % (ref 3–12)
Monocytes Absolute: 1.2 10*3/uL — ABNORMAL HIGH (ref 0.1–1.0)
NEUTROS PCT: 83 % — AB (ref 43–77)
Neutro Abs: 12.1 10*3/uL — ABNORMAL HIGH (ref 1.7–7.7)
Platelets: 236 10*3/uL (ref 150–400)
RBC: 4.65 MIL/uL (ref 4.22–5.81)
RDW: 12.9 % (ref 11.5–15.5)
WBC: 14.7 10*3/uL — ABNORMAL HIGH (ref 4.0–10.5)

## 2014-06-28 LAB — URINE MICROSCOPIC-ADD ON

## 2014-06-28 MED ORDER — OXYCODONE-ACETAMINOPHEN 5-325 MG PO TABS: 1.0000 | ORAL_TABLET | Freq: Four times a day (QID) | ORAL | Status: DC | PRN

## 2014-06-28 MED ORDER — MORPHINE SULFATE 4 MG/ML IJ SOLN
4.0000 mg | Freq: Once | INTRAMUSCULAR | Status: AC
  Administered 2014-06-28: 4 mg via INTRAVENOUS
  Filled 2014-06-28: qty 1

## 2014-06-28 MED ORDER — ONDANSETRON 4 MG PREPACK (~~LOC~~): 1.0000 | ORAL_TABLET | Freq: Three times a day (TID) | ORAL | Status: DC | PRN

## 2014-06-28 MED ORDER — OXYCODONE-ACETAMINOPHEN 5-325 MG PO TABS: 2.0000 | ORAL_TABLET | ORAL | Status: DC | PRN

## 2014-06-28 MED ORDER — ONDANSETRON HCL 4 MG/2ML IJ SOLN
4.0000 mg | Freq: Once | INTRAMUSCULAR | Status: AC
  Administered 2014-06-28: 4 mg via INTRAVENOUS
  Filled 2014-06-28: qty 2

## 2014-06-28 MED ORDER — TAMSULOSIN HCL 0.4 MG PO CAPS: 0.4000 mg | ORAL_CAPSULE | Freq: Every day | ORAL | Status: DC

## 2014-06-28 NOTE — ED Provider Notes (Signed)
CSN: [id number]     Arrival date & time 06/28/14  1801 History   First MD Initiated Contact with Patient 06/28/14 1902     Chief Complaint  Patient presents with  . Flank Pain     (Consider location/radiation/quality/duration/timing/severity/associated sxs/prior Treatment) Patient is a 59 y.o. male presenting with flank pain. The history is provided by the patient.  Flank Pain Pertinent negatives include no chest pain, no abdominal pain, no headaches and no shortness of breath.   patient developed pain in his right flank and into his abdomen and around 4:30 in the morning. He states he woke up. Since then he has had pain that has waxed and waned but has not gone away. No urinary symptoms. He's had nausea and vomited once. No fevers. States he felt as if he had to have a bowel movement but has not had one. The pain is dull. Is also somewhat crampy. No difficulty breathing. No trauma.  Past Medical History  Diagnosis Date  . CAD (coronary artery disease) 2000    a. History of MI ~2000 s/p PCI. b. CABG in 2006 (LIMA-LAD, RIMA-acute marginal, SVG-DX, SVG-RCA). c. NSTEMI 05/2013:severe stenoses of the SVG-PDA and SVG-diagonal, both treated successfully with PCI (DES x2).  Marland Kitchen History of hypertension   . Hyperlipidemia   . Obesity   . Hydrocele 2012  . Hypertension   . GERD (gastroesophageal reflux disease)   . Asthma    Past Surgical History  Procedure Laterality Date  . Coronary artery bypass grafting  May 2006    Four-vessel  . Pilonidal cyst excision    . Tonsillectomy    . Foot fracture surgery      right  . Cataract extraction w/phaco Left 05/17/2012    Procedure: CATARACT EXTRACTION PHACO AND INTRAOCULAR LENS PLACEMENT (IOC);  Surgeon: Tonny Branch, MD;  Location: [facility];  Service: Ophthalmology;  Laterality: Left;  CDE:  0.76  . Coronary angioplasty  2000  . Coronary artery bypass graft  2006    4 vessels  . Cataract extraction w/phaco Right 08/26/2012    Procedure: CATARACT  EXTRACTION PHACO AND INTRAOCULAR LENS PLACEMENT (IOC);  Surgeon: Tonny Branch, MD;  Location: [facility];  Service: Ophthalmology;  Laterality: Right;  CDE: 1.13  . Left heart catheterization with coronary/graft angiogram N/A 05/05/2011    Procedure: LEFT HEART CATHETERIZATION WITH Beatrix Fetters;  Surgeon: Josue Hector, MD;  Location[facility] CATH LAB;  Service: Cardiovascular;  Laterality: N/A;  . Left heart catheterization with coronary/graft angiogram N/A 06/06/2013    Procedure: LEFT HEART CATHETERIZATION WITH Beatrix Fetters;  Surgeon: Blane Ohara, MD;  Location: [facility] CATH LAB;  Service: Cardiovascular;  Laterality: N/A;   Family History  Problem Relation Age of Onset  . Cancer Mother   . Cervical cancer Mother   . Cancer Father   . Throat cancer Father   . Healthy Daughter   . Healthy Son    History  Substance Use Topics  . Smoking status: Never Smoker   . Smokeless tobacco: Never Used  . Alcohol Use: No    Review of Systems  Constitutional: Negative for fever, activity change and appetite change.  Eyes: Negative for pain.  Respiratory: Negative for chest tightness and shortness of breath.   Cardiovascular: Negative for chest pain and leg swelling.  Gastrointestinal: Positive for nausea and constipation. Negative for vomiting, abdominal pain and diarrhea.  Genitourinary: Positive for flank pain. Negative for frequency and penile pain.  Musculoskeletal: Positive for back pain. Negative  for neck stiffness.  Skin: Negative for rash.  Neurological: Negative for weakness, numbness and headaches.  Psychiatric/Behavioral: Negative for behavioral problems.      Allergies  Hydrocodone  Home Medications   Prior to Admission medications   Medication Sig Start Date End Date Taking? Authorizing Provider  aspirin EC 81 MG tablet Take 81 mg by mouth daily.   Yes Historical Provider, MD  atorvastatin (LIPITOR) 40 MG tablet Take 1 tablet (40 mg total) by mouth daily.  10/20/13  Yes [name], MD  metoprolol tartrate (LOPRESSOR) 25 MG tablet TAKE 1 TABLET BY MOUTH TWICE A DAY 05/25/14  Yes [name], MD  prasugrel (EFFIENT) 10 MG TABS tablet Take 1 tablet (10 mg total) by mouth daily. 06/07/13  Yes Dayna N Dunn, PA-C  nitroGLYCERIN (NITROSTAT) 0.4 MG SL tablet Place 0.4 mg under the tongue every 5 (five) minutes as needed for chest pain.    Historical Provider, MD  ondansetron (ZOFRAN) 4 mg TABS tablet Take 4 tablets by mouth every 8 (eight) hours as needed. 06/28/14   Davonna Belling, MD  oxyCODONE-acetaminophen (PERCOCET/ROXICET) 5-325 MG per tablet Take 2 tablets by mouth every 4 (four) hours as needed for severe pain. 06/28/14   Davonna Belling, MD  oxyCODONE-acetaminophen (PERCOCET/ROXICET) 5-325 MG per tablet Take 1-2 tablets by mouth every 6 (six) hours as needed for severe pain. 06/28/14   Davonna Belling, MD  tamsulosin (FLOMAX) 0.4 MG CAPS capsule Take 1 capsule (0.4 mg total) by mouth daily. 06/28/14   Davonna Belling, MD   BP 178/79 mmHg  Pulse 73  Temp(Src) 98.8 F (37.1 C) (Oral)  Resp 22  Ht 5\' 9"  (1.753 m)  Wt 240 lb (108.863 kg)  BMI 35.43 kg/m2  SpO2 95% Physical Exam  Constitutional: He is oriented to person, place, and time. He appears well-developed and well-nourished.  HENT:  Head: Normocephalic and atraumatic.  Eyes: EOM are normal. Pupils are equal, round, and reactive to light.  Neck: Normal range of motion. Neck supple.  Cardiovascular: Normal rate, regular rhythm and normal heart sounds.   No murmur heard. Pulmonary/Chest: Effort normal and breath sounds normal.  Abdominal: Soft. Bowel sounds are normal. He exhibits no distension and no mass. There is tenderness. There is no rebound and no guarding.  Right upper abdomen and to lower abdominal tenderness. No rebound or guarding. No mass. No CVA tenderness. No hernias palpated.  Musculoskeletal: Normal range of motion. He exhibits no edema.  Neurological: He is alert and  oriented to person, place, and time. No cranial nerve deficit.  Skin: Skin is warm and dry.  Psychiatric: He has a normal mood and affect.  Nursing note and vitals reviewed.   ED Course  Procedures (including critical care time) Labs Review Labs Reviewed  URINALYSIS, ROUTINE W REFLEX MICROSCOPIC - Abnormal; Notable for the following:    Specific Gravity, Urine >1.030 (*)    Hgb urine dipstick MODERATE (*)    All other components within normal limits  CBC WITH DIFFERENTIAL/PLATELET - Abnormal; Notable for the following:    WBC 14.7 (*)    Neutrophils Relative % 83 (*)    Neutro Abs 12.1 (*)    Lymphocytes Relative 9 (*)    Monocytes Absolute 1.2 (*)    All other components within normal limits  COMPREHENSIVE METABOLIC PANEL - Abnormal; Notable for the following:    Sodium 134 (*)    Glucose, Bld 133 (*)    GFR calc non Af Amer 68 (*)  GFR calc Af Amer 79 (*)    All other components within normal limits  URINE MICROSCOPIC-ADD ON    Imaging Review Ct Renal Stone Study  06/28/2014   CLINICAL DATA:  Right flank pain since this morning. No known injury. No hematuria.  EXAM: CT ABDOMEN AND PELVIS WITHOUT CONTRAST  TECHNIQUE: Multidetector CT imaging of the abdomen and pelvis was performed following the standard protocol without IV contrast.  COMPARISON:  Ultrasound abdomen 05/23/2011  FINDINGS: Lung bases are clear.  Postoperative changes in the mediastinum.  5 mm stone in the distal right ureter just above the ureterovesical junction. There is proximal ureterectasis and pyelocaliectasis with stranding around the right kidney. Left kidney in ureter appear normal. No additional stones demonstrated. No bladder stones or bladder wall thickening.  The unenhanced appearance of the liver, spleen, gallbladder, pancreas, adrenal glands, abdominal aorta, inferior vena cava, and retroperitoneal lymph nodes is unremarkable. Calcification along the splenic artery. Stomach, small bowel, and colon are  decompressed. No free air or free fluid in the abdomen. Abdominal wall musculature appears intact.  Pelvis: Prostate gland is not enlarged. No free or loculated pelvic fluid collections. No pelvic mass or lymphadenopathy. Appendix is normal. Fat in the inguinal canals bilaterally. No destructive bone lesions. Mild degenerative changes in the lumbar spine.  IMPRESSION: 5 mm stone in the distal right ureter with moderate proximal obstruction.   Electronically Signed   By: Lucienne Capers M.D.   On: 06/28/2014 21:12     EKG Interpretation None      MDM   Final diagnoses:  Right ureteral stone    Patient with 5 mm ureteral stone on the distal right ureter. Some hydronephrosis and hydroureter. Feels somewhat better after treatment. White count is elevated but no urinary tract infection. Good renal function. Will discharge home to follow-up with urology.    Davonna Belling, MD 06/28/14 2146

## 2014-06-28 NOTE — ED Notes (Signed)
Pt reports R side flank pain that began this am. Pain varies in intensity. No hx of kidney stones. No blood in urine or difficulty with urination. Pt denies lifting anything or known injury.

## 2014-06-28 NOTE — Discharge Instructions (Signed)

## 2014-07-02 ENCOUNTER — Emergency Department (HOSPITAL_COMMUNITY)
Admission: EM | Admit: 2014-07-02 | Discharge: 2014-07-02 | Disposition: A | Discharge status: Home / Self Care | Payer: 59 | Attending: Emergency Medicine | Admitting: Emergency Medicine

## 2014-07-02 ENCOUNTER — Encounter (HOSPITAL_COMMUNITY): Payer: Self-pay | Admitting: Emergency Medicine

## 2014-07-02 DIAGNOSIS — I2510 Atherosclerotic heart disease of native coronary artery without angina pectoris: Secondary | ICD-10-CM | POA: Insufficient documentation

## 2014-07-02 DIAGNOSIS — Z87438 Personal history of other diseases of male genital organs: Secondary | ICD-10-CM | POA: Insufficient documentation

## 2014-07-02 DIAGNOSIS — I10 Essential (primary) hypertension: Secondary | ICD-10-CM | POA: Insufficient documentation

## 2014-07-02 DIAGNOSIS — N23 Unspecified renal colic: Secondary | ICD-10-CM | POA: Diagnosis not present

## 2014-07-02 DIAGNOSIS — E785 Hyperlipidemia, unspecified: Secondary | ICD-10-CM | POA: Diagnosis not present

## 2014-07-02 DIAGNOSIS — Z79899 Other long term (current) drug therapy: Secondary | ICD-10-CM | POA: Insufficient documentation

## 2014-07-02 DIAGNOSIS — Z7982 Long term (current) use of aspirin: Secondary | ICD-10-CM | POA: Diagnosis not present

## 2014-07-02 DIAGNOSIS — E669 Obesity, unspecified: Secondary | ICD-10-CM | POA: Insufficient documentation

## 2014-07-02 DIAGNOSIS — J45909 Unspecified asthma, uncomplicated: Secondary | ICD-10-CM | POA: Insufficient documentation

## 2014-07-02 DIAGNOSIS — Z8719 Personal history of other diseases of the digestive system: Secondary | ICD-10-CM | POA: Diagnosis not present

## 2014-07-02 DIAGNOSIS — R109 Unspecified abdominal pain: Secondary | ICD-10-CM | POA: Diagnosis present

## 2014-07-02 MED ORDER — KETOROLAC TROMETHAMINE 30 MG/ML IJ SOLN
30.0000 mg | Freq: Once | INTRAMUSCULAR | Status: AC
  Administered 2014-07-02: 30 mg via INTRAVENOUS
  Filled 2014-07-02: qty 1

## 2014-07-02 MED ORDER — HYDROMORPHONE HCL 1 MG/ML IJ SOLN
1.0000 mg | Freq: Once | INTRAMUSCULAR | Status: AC
  Administered 2014-07-02: 1 mg via INTRAVENOUS
  Filled 2014-07-02: qty 1

## 2014-07-02 MED ORDER — ONDANSETRON HCL 4 MG/2ML IJ SOLN
4.0000 mg | Freq: Once | INTRAMUSCULAR | Status: AC
  Administered 2014-07-02: 4 mg via INTRAVENOUS
  Filled 2014-07-02: qty 2

## 2014-07-02 MED ORDER — HYDROMORPHONE HCL 2 MG PO TABS: 2.0000 mg | ORAL_TABLET | ORAL | Status: DC | PRN

## 2014-07-02 NOTE — Discharge Instructions (Signed)
It was our pleasure to provide your ER care today - we hope that you feel better.  Drink adequate fluids, strain urine.  Take motrin or aleve as need for pain.  You may also take dilaudid as prescribed, as need, for pain. No driving when taking dilaudid.  Follow up with urologist tomorrow.  Return to ER if worse, new symptoms, fevers, intractable pain, persistent vomiting, other concern.  You were given pain medication in the ER - no driving for the next 4 hours.      Kidney Stones Kidney stones (urolithiasis) are deposits that form inside your kidneys. The intense pain is caused by the stone moving through the urinary tract. When the stone moves, the ureter goes into spasm around the stone. The stone is usually passed in the urine.  CAUSES   A disorder that makes certain neck glands produce too much parathyroid hormone (primary hyperparathyroidism).  A buildup of uric acid crystals, similar to gout in your joints.  Narrowing (stricture) of the ureter.  A kidney obstruction present at birth (congenital obstruction).  Previous surgery on the kidney or ureters.  Numerous kidney infections. SYMPTOMS   Feeling sick to your stomach (nauseous).  Throwing up (vomiting).  Blood in the urine (hematuria).  Pain that usually spreads (radiates) to the groin.  Frequency or urgency of urination. DIAGNOSIS   Taking a history and physical exam.  Blood or urine tests.  CT scan.  Occasionally, an examination of the inside of the urinary bladder (cystoscopy) is performed. TREATMENT   Observation.  Increasing your fluid intake.  Extracorporeal shock wave lithotripsy--This is a noninvasive procedure that uses shock waves to break up kidney stones.  Surgery may be needed if you have severe pain or persistent obstruction. There are various surgical procedures. Most of the procedures are performed with the use of small instruments. Only small incisions are needed to accommodate  these instruments, so recovery time is minimized. The size, location, and chemical composition are all important variables that will determine the proper choice of action for you. Talk to your health care provider to better understand your situation so that you will minimize the risk of injury to yourself and your kidney.  HOME CARE INSTRUCTIONS   Drink enough water and fluids to keep your urine clear or pale yellow. This will help you to pass the stone or stone fragments.  Strain all urine through the provided strainer. Keep all particulate matter and stones for your health care provider to see. The stone causing the pain may be as small as a grain of salt. It is very important to use the strainer each and every time you pass your urine. The collection of your stone will allow your health care provider to analyze it and verify that a stone has actually passed. The stone analysis will often identify what you can do to reduce the incidence of recurrences.  Only take over-the-counter or prescription medicines for pain, discomfort, or fever as directed by your health care provider.  Make a follow-up appointment with your health care provider as directed.  Get follow-up X-rays if required. The absence of pain does not always mean that the stone has passed. It may have only stopped moving. If the urine remains completely obstructed, it can cause loss of kidney function or even complete destruction of the kidney. It is your responsibility to make sure X-rays and follow-ups are completed. Ultrasounds of the kidney can show blockages and the status of the kidney. Ultrasounds are not  associated with any radiation and can be performed easily in a matter of minutes. SEEK MEDICAL CARE IF:  You experience pain that is progressive and unresponsive to any pain medicine you have been prescribed. SEEK IMMEDIATE MEDICAL CARE IF:   Pain cannot be controlled with the prescribed medicine.  You have a fever or shaking  chills.  The severity or intensity of pain increases over 18 hours and is not relieved by pain medicine.  You develop a new onset of abdominal pain.  You feel faint or pass out.  You are unable to urinate. MAKE SURE YOU:   Understand these instructions.  Will watch your condition.  Will get help right away if you are not doing well or get worse. Document Released: 03/10/2005 Document Revised: 11/10/2012 Document Reviewed: 08/11/2012 [facility] Patient Information 2015 [place], [place]. This information is not intended to replace advice given to you by your health care provider. Make sure you discuss any questions you have with your health care provider.

## 2014-07-02 NOTE — ED Notes (Signed)
Pt seen wed. At [facility] and dx with kidney stone. sts he has not passed the stone and is not getting relief with pain meds anymore. C/o pain to R side. Denies nv today but has had some in days prior.

## 2014-07-02 NOTE — ED Provider Notes (Signed)
CSN: [id number]     Arrival date & time 07/02/14  1601 History   First MD Initiated Contact with Patient 07/02/14 1643     Chief Complaint  Patient presents with  . Flank Pain     (Consider location/radiation/quality/duration/timing/severity/associated sxs/prior Treatment) Patient is a 59 y.o. male presenting with flank pain. The history is provided by the patient.  Flank Pain Pertinent negatives include no chest pain, no abdominal pain, no headaches and no shortness of breath.  pt with hx kidney stones. Recent acute onset right flank pain 4 days ago. Pain constant, dull, waxes and wanes in intensity, moderate-severe.  Was seen at [facility] for same, had ct done, and pain was controlled. Was sent home on percocet and urology f/u this coming week.  Pt initially was controlled. Today pt states drank 2 large glasses sweet tea, then noted increased right flank pain. Mod-severe, same as prior. No fever or chills. No dysuria or hematuria. No scrotal or testicular pain.       Past Medical History  Diagnosis Date  . CAD (coronary artery disease) 2000    a. History of MI ~2000 s/p PCI. b. CABG in 2006 (LIMA-LAD, RIMA-acute marginal, SVG-DX, SVG-RCA). c. NSTEMI 05/2013:severe stenoses of the SVG-PDA and SVG-diagonal, both treated successfully with PCI (DES x2).  Marland Kitchen History of hypertension   . Hyperlipidemia   . Obesity   . Hydrocele 2012  . Hypertension   . GERD (gastroesophageal reflux disease)   . Asthma    Past Surgical History  Procedure Laterality Date  . Coronary artery bypass grafting  May 2006    Four-vessel  . Pilonidal cyst excision    . Tonsillectomy    . Foot fracture surgery      right  . Cataract extraction w/phaco Left 05/17/2012    Procedure: CATARACT EXTRACTION PHACO AND INTRAOCULAR LENS PLACEMENT (IOC);  Surgeon: Tonny Branch, MD;  Location: [facility];  Service: Ophthalmology;  Laterality: Left;  CDE:  0.76  . Coronary angioplasty  2000  . Coronary artery bypass graft  2006    4  vessels  . Cataract extraction w/phaco Right 08/26/2012    Procedure: CATARACT EXTRACTION PHACO AND INTRAOCULAR LENS PLACEMENT (IOC);  Surgeon: Tonny Branch, MD;  Location: [facility];  Service: Ophthalmology;  Laterality: Right;  CDE: 1.13  . Left heart catheterization with coronary/graft angiogram N/A 05/05/2011    Procedure: LEFT HEART CATHETERIZATION WITH Beatrix Fetters;  Surgeon: Josue Hector, MD;  Location: [facility] CATH LAB;  Service: Cardiovascular;  Laterality: N/A;  . Left heart catheterization with coronary/graft angiogram N/A 06/06/2013    Procedure: LEFT HEART CATHETERIZATION WITH Beatrix Fetters;  Surgeon: Blane Ohara, MD;  Location: [facility] CATH LAB;  Service: Cardiovascular;  Laterality: N/A;   Family History  Problem Relation Age of Onset  . Cancer Mother   . Cervical cancer Mother   . Cancer Father   . Throat cancer Father   . Healthy Daughter   . Healthy Son    History  Substance Use Topics  . Smoking status: Never Smoker   . Smokeless tobacco: Never Used  . Alcohol Use: No    Review of Systems  Constitutional: Negative for fever.  HENT: Negative for sore throat.   Eyes: Negative for redness.  Respiratory: Negative for shortness of breath.   Cardiovascular: Negative for chest pain.  Gastrointestinal: Negative for vomiting, abdominal pain and diarrhea.  Endocrine: Negative for polyuria.  Genitourinary: Positive for flank pain. Negative for dysuria.  Musculoskeletal: Negative for  back pain and neck pain.  Skin: Negative for rash.  Neurological: Negative for headaches.  Hematological: Does not bruise/bleed easily.  Psychiatric/Behavioral: Negative for confusion.      Allergies  Hydrocodone  Home Medications   Prior to Admission medications   Medication Sig Start Date End Date Taking? Authorizing Provider  aspirin EC 81 MG tablet Take 81 mg by mouth daily.    Historical Provider, MD  atorvastatin (LIPITOR) 40 MG tablet Take 1 tablet (40 mg total)  by mouth daily. 10/20/13   [name], MD  metoprolol tartrate (LOPRESSOR) 25 MG tablet TAKE 1 TABLET BY MOUTH TWICE A DAY 05/25/14   [name], MD  nitroGLYCERIN (NITROSTAT) 0.4 MG SL tablet Place 0.4 mg under the tongue every 5 (five) minutes as needed for chest pain.    Historical Provider, MD  ondansetron (ZOFRAN) 4 mg TABS tablet Take 4 tablets by mouth every 8 (eight) hours as needed. 06/28/14   Davonna Belling, MD  oxyCODONE-acetaminophen (PERCOCET/ROXICET) 5-325 MG per tablet Take 2 tablets by mouth every 4 (four) hours as needed for severe pain. 06/28/14   Davonna Belling, MD  oxyCODONE-acetaminophen (PERCOCET/ROXICET) 5-325 MG per tablet Take 1-2 tablets by mouth every 6 (six) hours as needed for severe pain. 06/28/14   Davonna Belling, MD  prasugrel (EFFIENT) 10 MG TABS tablet Take 1 tablet (10 mg total) by mouth daily. 06/07/13   Dayna N Dunn, PA-C  tamsulosin (FLOMAX) 0.4 MG CAPS capsule Take 1 capsule (0.4 mg total) by mouth daily. 06/28/14   Davonna Belling, MD   BP 145/86 mmHg  Pulse 88  Temp(Src) 98.5 F (36.9 C)  Resp 14  Ht 5\' 9"  (1.753 m)  Wt 241 lb (109.317 kg)  BMI 35.57 kg/m2  SpO2 95% Physical Exam  Constitutional: He is oriented to person, place, and time. He appears well-developed and well-nourished. No distress.  HENT:  Head: Atraumatic.  Eyes: Conjunctivae are normal. No scleral icterus.  Neck: Neck supple. No tracheal deviation present.  Cardiovascular: Normal rate.   Pulmonary/Chest: Effort normal. No accessory muscle usage. No respiratory distress.  Abdominal: Soft. Bowel sounds are normal. He exhibits no distension and no mass. There is no tenderness. There is no rebound and no guarding.  Genitourinary:  No cva tenderness. No scrotal or testicular pain, swelling, or tenderness.   Musculoskeletal: Normal range of motion.  Neurological: He is alert and oriented to person, place, and time.  Skin: Skin is warm and dry. He is not diaphoretic.  Psychiatric:  He has a normal mood and affect.  Nursing note and vitals reviewed.   ED Course  Procedures (including critical care time) Labs Review  Results for orders placed or performed during the hospital encounter of 06/28/14  Urinalysis, Routine w reflex microscopic  Result Value Ref Range   Color, Urine YELLOW YELLOW   APPearance CLEAR CLEAR   Specific Gravity, Urine >1.030 (H) 1.005 - 1.030   pH 5.5 5.0 - 8.0   Glucose, UA NEGATIVE NEGATIVE mg/dL   Hgb urine dipstick MODERATE (A) NEGATIVE   Bilirubin Urine NEGATIVE NEGATIVE   Ketones, ur NEGATIVE NEGATIVE mg/dL   Protein, ur NEGATIVE NEGATIVE mg/dL   Urobilinogen, UA 0.2 0.0 - 1.0 mg/dL   Nitrite NEGATIVE NEGATIVE   Leukocytes, UA NEGATIVE NEGATIVE  Urine microscopic-add on  Result Value Ref Range   Squamous Epithelial / LPF RARE RARE   WBC, UA 0-2 <3 WBC/hpf   RBC / HPF 3-6 <3 RBC/hpf   Bacteria, UA RARE RARE  CBC with Differential  Result Value Ref Range   WBC 14.7 (H) 4.0 - 10.5 K/uL   RBC 4.65 4.22 - 5.81 MIL/uL   Hemoglobin 14.1 13.0 - 17.0 g/dL   HCT 41.3 39.0 - 52.0 %   MCV 88.8 78.0 - 100.0 fL   MCH 30.3 26.0 - 34.0 pg   MCHC 34.1 30.0 - 36.0 g/dL   RDW 12.9 11.5 - 15.5 %   Platelets 236 150 - 400 K/uL   Neutrophils Relative % 83 (H) 43 - 77 %   Neutro Abs 12.1 (H) 1.7 - 7.7 K/uL   Lymphocytes Relative 9 (L) 12 - 46 %   Lymphs Abs 1.3 0.7 - 4.0 K/uL   Monocytes Relative 8 3 - 12 %   Monocytes Absolute 1.2 (H) 0.1 - 1.0 K/uL   Eosinophils Relative 0 0 - 5 %   Eosinophils Absolute 0.0 0.0 - 0.7 K/uL   Basophils Relative 0 0 - 1 %   Basophils Absolute 0.0 0.0 - 0.1 K/uL  Comprehensive metabolic panel  Result Value Ref Range   Sodium 134 (L) 135 - 145 mmol/L   Potassium 4.2 3.5 - 5.1 mmol/L   Chloride 98 96 - 112 mmol/L   CO2 26 19 - 32 mmol/L   Glucose, Bld 133 (H) 70 - 99 mg/dL   BUN 19 6 - 23 mg/dL   Creatinine, Ser 1.15 0.50 - 1.35 mg/dL   Calcium 9.5 8.4 - 10.5 mg/dL   Total Protein 8.0 6.0 - 8.3 g/dL    Albumin 4.5 3.5 - 5.2 g/dL   AST 28 0 - 37 U/L   ALT 23 0 - 53 U/L   Alkaline Phosphatase 90 39 - 117 U/L   Total Bilirubin 1.0 0.3 - 1.2 mg/dL   GFR calc non Af Amer 68 (L) >90 mL/min   GFR calc Af Amer 79 (L) >90 mL/min   Anion gap 10 5 - 15   Ct Renal Stone Study  06/28/2014   CLINICAL DATA:  Right flank pain since this morning. No known injury. No hematuria.  EXAM: CT ABDOMEN AND PELVIS WITHOUT CONTRAST  TECHNIQUE: Multidetector CT imaging of the abdomen and pelvis was performed following the standard protocol without IV contrast.  COMPARISON:  Ultrasound abdomen 05/23/2011  FINDINGS: Lung bases are clear.  Postoperative changes in the mediastinum.  5 mm stone in the distal right ureter just above the ureterovesical junction. There is proximal ureterectasis and pyelocaliectasis with stranding around the right kidney. Left kidney in ureter appear normal. No additional stones demonstrated. No bladder stones or bladder wall thickening.  The unenhanced appearance of the liver, spleen, gallbladder, pancreas, adrenal glands, abdominal aorta, inferior vena cava, and retroperitoneal lymph nodes is unremarkable. Calcification along the splenic artery. Stomach, small bowel, and colon are decompressed. No free air or free fluid in the abdomen. Abdominal wall musculature appears intact.  Pelvis: Prostate gland is not enlarged. No free or loculated pelvic fluid collections. No pelvic mass or lymphadenopathy. Appendix is normal. Fat in the inguinal canals bilaterally. No destructive bone lesions. Mild degenerative changes in the lumbar spine.  IMPRESSION: 5 mm stone in the distal right ureter with moderate proximal obstruction.   Electronically Signed   By: Lucienne Capers M.D.   On: 06/28/2014 21:12       MDM   Iv ns. Dilaudid 1 mg iv. zofran iv. toradol iv.  Reviewed nursing notes and prior charts for additional history.   Recent ct w 5 mm  distal right ureteral stone.  Pt states straining urine, and  hasnt yet passed. Pain is same.   Recheck pain completely resolved.  Pt states just about out of percocet at home, and requests med given in ed for pain.  Will give small quantity rx dilaudid 2 mg tabs, and discussed urology f/u tomorrow.     Lajean Saver, MD 07/02/14 [phone]

## 2014-07-04 MED FILL — Oxycodone w/ Acetaminophen Tab 5-325 MG: ORAL | Qty: 6 | Status: AC

## 2014-07-20 ENCOUNTER — Ambulatory Visit (INDEPENDENT_AMBULATORY_CARE_PROVIDER_SITE_OTHER): Payer: Self-pay | Admitting: Internal Medicine

## 2014-09-04 ENCOUNTER — Other Ambulatory Visit: Payer: Self-pay | Admitting: Cardiovascular Disease

## 2014-09-06 ENCOUNTER — Telehealth: Payer: Self-pay | Admitting: *Deleted

## 2014-09-06 MED ORDER — PRASUGREL HCL 10 MG PO TABS: 10.0000 mg | ORAL_TABLET | Freq: Every day | ORAL | Status: DC

## 2014-09-06 NOTE — Telephone Encounter (Signed)
Please forward to Dr. Burt Knack since the decision of duration of DAPT is up to his primary cardiologist. Winifred Olive Dunn PA-C

## 2014-09-06 NOTE — Telephone Encounter (Signed)
Rx sent to the pharmacy.  It looks like this medication can be discontinued per 10/20/13 office note from Dr Burt Knack but I would like the pt to be seen for a ROV before that determination is made.

## 2014-10-18 NOTE — Progress Notes (Signed)
Cardiology Office Note   Date:  10/19/2014   ID:  [name], DOB September 12, 1955, MRN [id number]  PCP:  Leonides Grills, MD  Cardiologist:  Dr. [name]   Electrophysiologist:  n/a  Chief Complaint  Patient presents with  . Coronary Artery Disease     History of Present Illness: Tarance Balan Gehring is a 59 y.o. male with a hx of CAD s/p CABG in 2006, HTN, HL, asthma, GERD.  He suffered a NSTEMI in 05/2013 and LHC demonstrated severe disease in S-PDA and S-Dx.  The L-LAD and free RIMA-OM were patent.  He underwent PCI to both SVGs with a DES.  EF was preserved.  Last seen by Dr. [name] 09/2013.    He returns for FU.  He is here alone today.  Overall doing well.  No chest pain.  He has DOE with more extreme activities. He is NYHA 2-2b.  No orthopnea, PND.  He has occasional pedal edema.  BP at home 140s.  No syncope.  He does not smoke.     Studies/Reports Reviewed Today:  Echo 05/2013 Moderate LVH. EF 60% to 65%. Wall motion was normal   LHC 05/2013 LM:  calcified without obstructive disease LAD: 80% ostial stenosis and 100% occlusion at the first septal perforator LCx:  70% proximal circumflex. OM1 80%, OM2 with diffuse nonobstructive disease RCA: Total 100% occlusion in the mid-vessel. LIMA-LAD: patent without disease. The LAD diffusely diseased, esp apical portion with 80% apical stenosis Free RIMA - OM: patent without obstructive disease SVG - diagonal: 95% stenosis in mid-body of graft SVG - PDA: 95% stenosis in proximal body of graft LVEF is estimated at 55-65%, there is no significant mitral regurgitation  PCI:  3 x 16 mm Promus DES to mid S-Dx PCI:  2.5 x 24 mm Promus DES to prox S-PDA Final Conclusions:  1. Severe 3 vessel CAD with total occlusion of the LAD and RCA and severe stenosis of the LCx 2. S/P CABG with continued patency of the LIMA-LAD and free RIMA-OM 3. Severe stenoses of the SVG-PDA and SVG-diagonal, both treated successfully with PCI 4.  Normal LV function     Past Medical History  Diagnosis Date  . CAD (coronary artery disease) 2000    a. History of MI ~2000 s/p PCI. b. CABG in 2006 (LIMA-LAD, RIMA-acute marginal, SVG-DX, SVG-RCA). c. NSTEMI 05/2013:severe stenoses of the SVG-PDA and SVG-diagonal, both treated successfully with PCI (DES x2).  Marland Kitchen History of hypertension   . Hyperlipidemia   . Obesity   . Hydrocele 2012  . Hypertension   . GERD (gastroesophageal reflux disease)   . Asthma     Past Surgical History  Procedure Laterality Date  . Coronary artery bypass grafting  May 2006    Four-vessel  . Pilonidal cyst excision    . Tonsillectomy    . Foot fracture surgery      right  . Cataract extraction w/phaco Left 05/17/2012    Procedure: CATARACT EXTRACTION PHACO AND INTRAOCULAR LENS PLACEMENT (IOC);  Surgeon: Tonny Branch, MD;  Location: [facility];  Service: Ophthalmology;  Laterality: Left;  CDE:  0.76  . Coronary angioplasty  2000  . Coronary artery bypass graft  2006    4 vessels  . Cataract extraction w/phaco Right 08/26/2012    Procedure: CATARACT EXTRACTION PHACO AND INTRAOCULAR LENS PLACEMENT (IOC);  Surgeon: Tonny Branch, MD;  Location: [facility];  Service: Ophthalmology;  Laterality: Right;  CDE: 1.13  . Left heart catheterization with  coronary/graft angiogram N/A 05/05/2011    Procedure: LEFT HEART CATHETERIZATION WITH Beatrix Fetters;  Surgeon: Josue Hector, MD;  Location: [facility] CATH LAB;  Service: Cardiovascular;  Laterality: N/A;  . Left heart catheterization with coronary/graft angiogram N/A 06/06/2013    Procedure: LEFT HEART CATHETERIZATION WITH Beatrix Fetters;  Surgeon: Blane Ohara, MD;  Location: [facility] CATH LAB;  Service: Cardiovascular;  Laterality: N/A;     Current Outpatient Prescriptions  Medication Sig Dispense Refill  . aspirin EC 81 MG tablet Take 81 mg by mouth daily.    Marland Kitchen atorvastatin (LIPITOR) 80 MG tablet Take 1 tablet (80 mg total) by mouth daily. 90 tablet 3  .  clopidogrel (PLAVIX) 75 MG tablet Take 1 tablet (75 mg total) by mouth daily. 90 tablet 3  . hydrochlorothiazide (MICROZIDE) 12.5 MG capsule Take 1 capsule (12.5 mg total) by mouth daily. 90 capsule 3  . metoprolol tartrate (LOPRESSOR) 25 MG tablet TAKE 1 TABLET BY MOUTH TWICE A DAY 60 tablet 0  . nitroGLYCERIN (NITROSTAT) 0.4 MG SL tablet Place 0.4 mg under the tongue every 5 (five) minutes as needed for chest pain.     No current facility-administered medications for this visit.    Allergies:   Hydrocodone    Social History:  The patient  reports that he has never smoked. He has never used smokeless tobacco. He reports that he does not drink alcohol or use illicit drugs.   Family History:  The patient's family history includes Cancer in his father and mother; Cervical cancer in his mother; Healthy in his daughter and son; Throat cancer in his father.    ROS:   Please see the history of present illness.   Review of Systems  Constitution: Positive for chills and weight gain.  Cardiovascular: Positive for leg swelling.  Musculoskeletal: Positive for joint pain.  All other systems reviewed and are negative.     PHYSICAL EXAM: VS:  BP 140/78 mmHg  Pulse 70  Ht 5\' 9"  (1.753 m)  Wt 243 lb 12.8 oz (110.587 kg)  BMI 35.99 kg/m2    Wt Readings from Last 3 Encounters:  10/19/14 243 lb 12.8 oz (110.587 kg)  07/02/14 241 lb (109.317 kg)  06/28/14 240 lb (108.863 kg)     GEN: Well nourished, well developed, in no acute distress HEENT: normal Neck: no JVD, no carotid bruits, no masses Cardiac:  Normal S1/S2, RRR; no murmur, no rubs or gallops, trace bilateral LE edema   Respiratory:  clear to auscultation bilaterally, no wheezing, rhonchi or rales. GI: soft, nontender, nondistended, + BS MS: no deformity or atrophy Skin: warm and dry  Neuro:  CNs II-XII intact, Strength and sensation are intact Psych: Normal affect   EKG:  EKG is ordered today.  It demonstrates:   NSR, HR 70,  normal axis, RSR' V1-2, NSSTTW changes, no significant change since prior tracings.    Recent Labs: 06/28/2014: ALT 23; BUN 19; Creatinine, Ser 1.15; Hemoglobin 14.1; Platelets 236; Potassium 4.2; Sodium 134*    Lipid Panel    Component Value Date/Time   CHOL 109 09/02/2013 0740   TRIG 111.0 09/02/2013 0740   HDL 42.90 09/02/2013 0740   CHOLHDL 3 09/02/2013 0740   VLDL 22.2 09/02/2013 0740   LDLCALC 44 09/02/2013 0740      ASSESSMENT AND PLAN:  Coronary artery disease involving coronary bypass graft of native heart without angina pectoris:  No angina. It has been > 1 year since his NSTEMI.  He is tolerating  Effient + ASA.  He should probably remain on dual antiplatelet Rx long-term.  Discussed with Dr. [name].  Will DC Effient.  Start Plavix 75 mg QD.  Continue statin, beta-blocker.  Essential hypertension:  Borderline control.  He has LE edema at times.  Will start HCTZ 12.5 mg QD.  Check BMET 1-2 weeks.   HLD (hyperlipidemia):  Continue statin.  PCP recently increased by to Lipitor 80 mg QD.  Arrange FU Lipids and LFTs.  Obesity:  Refer to [employer] program at [employer] with Vanita Ingles, RN.     Medication Changes: Current medicines are reviewed at length with the patient today.  Concerns regarding medicines are as outlined above.  The following changes have been made:   Discontinued Medications   HYDROMORPHONE (DILAUDID) 2 MG TABLET    Take 1 tablet (2 mg total) by mouth every 4 (four) hours as needed for severe pain.   ONDANSETRON (ZOFRAN) 4 MG TABS TABLET    Take 4 tablets by mouth every 8 (eight) hours as needed.   OXYCODONE-ACETAMINOPHEN (PERCOCET/ROXICET) 5-325 MG PER TABLET    Take 2 tablets by mouth every 4 (four) hours as needed for severe pain.   OXYCODONE-ACETAMINOPHEN (PERCOCET/ROXICET) 5-325 MG PER TABLET    Take 1-2 tablets by mouth every 6 (six) hours as needed for severe pain.   PRASUGREL (EFFIENT) 10 MG TABS TABLET    Take 1 tablet (10 mg total) by mouth  daily.   TAMSULOSIN (FLOMAX) 0.4 MG CAPS CAPSULE    Take 1 capsule (0.4 mg total) by mouth daily.   Modified Medications   Modified Medication Previous Medication   ATORVASTATIN (LIPITOR) 80 MG TABLET atorvastatin (LIPITOR) 40 MG tablet      Take 1 tablet (80 mg total) by mouth daily.    Take 1 tablet (40 mg total) by mouth daily.   New Prescriptions   CLOPIDOGREL (PLAVIX) 75 MG TABLET    Take 1 tablet (75 mg total) by mouth daily.   HYDROCHLOROTHIAZIDE (MICROZIDE) 12.5 MG CAPSULE    Take 1 capsule (12.5 mg total) by mouth daily.    Labs/ tests ordered today include:   Orders Placed This Encounter  Procedures  . Basic Metabolic Panel (BMET)  . Hepatic function panel  . Lipid Profile  . EKG 12-Lead     Disposition:   FU with Dr. [name] 6 mos.    Signed, Versie Starks, MHS 10/19/2014 4:28 PM    [facility] [place], [place], [place]  [address] Phone: [phone]; Fax: [phone]

## 2014-10-19 ENCOUNTER — Encounter: Payer: Self-pay | Admitting: *Deleted

## 2014-10-19 ENCOUNTER — Ambulatory Visit (INDEPENDENT_AMBULATORY_CARE_PROVIDER_SITE_OTHER): Payer: Commercial Managed Care - HMO | Admitting: Physician Assistant

## 2014-10-19 ENCOUNTER — Encounter: Payer: Self-pay | Admitting: Physician Assistant

## 2014-10-19 VITALS — BP 140/78 | HR 70 | Ht 69.0 in | Wt 243.8 lb

## 2014-10-19 DIAGNOSIS — I10 Essential (primary) hypertension: Secondary | ICD-10-CM | POA: Diagnosis not present

## 2014-10-19 DIAGNOSIS — I25810 Atherosclerosis of coronary artery bypass graft(s) without angina pectoris: Secondary | ICD-10-CM

## 2014-10-19 DIAGNOSIS — E785 Hyperlipidemia, unspecified: Secondary | ICD-10-CM | POA: Diagnosis not present

## 2014-10-19 MED ORDER — HYDROCHLOROTHIAZIDE 12.5 MG PO CAPS: 12.5000 mg | ORAL_CAPSULE | Freq: Every day | ORAL | Status: DC

## 2014-10-19 MED ORDER — CLOPIDOGREL BISULFATE 75 MG PO TABS: 75.0000 mg | ORAL_TABLET | Freq: Every day | ORAL | Status: DC

## 2014-10-19 MED ORDER — ATORVASTATIN CALCIUM 80 MG PO TABS: 80.0000 mg | ORAL_TABLET | Freq: Every day | ORAL | Status: DC

## 2014-10-19 NOTE — Patient Instructions (Addendum)
Medication Instructions:  1.  Discontinue Effient (to finish bottle) 2.  Start Plavix 75 mg 1 daily (when finished with Effient) 3.  A refill was sent to your local pharmacy for the Lipitor 80 mg tablets taking 1 daily 4.  We have called in a new rx for Hydrochlorothiazide 12.5 mg taking 1 tablet daily  Labwork: BMET LFT LIPIDS in 1 - 2 weeks  Testing/Procedures: NONE  Follow-Up: Your physician wants you to follow-up in: Dr. Burt Knack in 6 months.  You will receive a reminder letter in the mail two months in advance. If you don't receive a letter, please call our office to schedule the follow-up appointment.   Any Other Special Instructions Will Be Listed Below (If Applicable). You have been referred to the [facility] for the Provider Referral Exercise Program.  You have been given the referral for you to take to [facility] and get started.

## 2014-11-02 ENCOUNTER — Telehealth: Payer: Self-pay | Admitting: *Deleted

## 2014-11-02 ENCOUNTER — Other Ambulatory Visit (INDEPENDENT_AMBULATORY_CARE_PROVIDER_SITE_OTHER): Payer: Commercial Managed Care - HMO | Admitting: *Deleted

## 2014-11-02 DIAGNOSIS — I10 Essential (primary) hypertension: Secondary | ICD-10-CM

## 2014-11-02 DIAGNOSIS — E785 Hyperlipidemia, unspecified: Secondary | ICD-10-CM

## 2014-11-02 LAB — BASIC METABOLIC PANEL
BUN: 13 mg/dL (ref 6–23)
CHLORIDE: 101 meq/L (ref 96–112)
CO2: 32 mEq/L (ref 19–32)
Calcium: 9.2 mg/dL (ref 8.4–10.5)
Creatinine, Ser: 1.03 mg/dL (ref 0.40–1.50)
GFR: 78.36 mL/min (ref >60.00)
Glucose, Bld: 111 mg/dL — ABNORMAL HIGH (ref 70–99)
Potassium: 3.2 mEq/L — ABNORMAL LOW (ref 3.5–5.1)
Sodium: 141 mEq/L (ref 135–145)

## 2014-11-02 LAB — LIPID PANEL
CHOLESTEROL: 134 mg/dL (ref 0–200)
HDL: 37.8 mg/dL — AB (ref >39.00)
LDL Cholesterol: 77 mg/dL (ref 0–99)
NonHDL: 95.83
TRIGLYCERIDES: 92 mg/dL (ref 0.0–149.0)
Total CHOL/HDL Ratio: 4
VLDL: 18.4 mg/dL (ref 0.0–40.0)

## 2014-11-02 LAB — HEPATIC FUNCTION PANEL
ALBUMIN: 4 g/dL (ref 3.5–5.2)
ALT: 14 U/L (ref 0–53)
AST: 14 U/L (ref 0–37)
Alkaline Phosphatase: 78 U/L (ref 39–117)
Bilirubin, Direct: 0.2 mg/dL (ref 0.0–0.3)
Total Bilirubin: 1 mg/dL (ref 0.2–1.2)
Total Protein: 6.8 g/dL (ref 6.0–8.3)

## 2014-11-02 NOTE — Addendum Note (Signed)
Addended by: Eulis Foster on: 11/02/2014 07:37 AM   Modules accepted: Orders

## 2014-11-02 NOTE — Telephone Encounter (Signed)
lmptcb to go over results  

## 2014-11-03 MED ORDER — POTASSIUM CHLORIDE CRYS ER 20 MEQ PO TBCR: 20.0000 meq | EXTENDED_RELEASE_TABLET | Freq: Every day | ORAL | Status: DC

## 2014-11-03 NOTE — Telephone Encounter (Signed)
Ptcb and has been notified of lab results. Pt agreeable to starting K+ 20 meq daily; Rx sent into [facility] in [place]. Pt verbalized understanding to Plan of Care.

## 2014-11-03 NOTE — Telephone Encounter (Signed)
lmptcb x 2 on both home and cell to go over lab results and to start K+ due to K+ 3.2

## 2014-11-06 ENCOUNTER — Other Ambulatory Visit: Payer: Self-pay | Admitting: Cardiovascular Disease

## 2015-05-07 ENCOUNTER — Other Ambulatory Visit (INDEPENDENT_AMBULATORY_CARE_PROVIDER_SITE_OTHER): Payer: Commercial Managed Care - HMO | Admitting: *Deleted

## 2015-05-07 DIAGNOSIS — E785 Hyperlipidemia, unspecified: Secondary | ICD-10-CM | POA: Diagnosis not present

## 2015-05-07 LAB — BASIC METABOLIC PANEL
BUN: 14 mg/dL (ref 7–25)
CALCIUM: 8.8 mg/dL (ref 8.6–10.3)
CO2: 25 mmol/L (ref 20–31)
Chloride: 105 mmol/L (ref 98–110)
Creat: 1 mg/dL (ref 0.70–1.25)
Glucose, Bld: 95 mg/dL (ref 65–99)
POTASSIUM: 3.8 mmol/L (ref 3.5–5.3)
Sodium: 138 mmol/L (ref 135–146)

## 2015-05-07 LAB — LIPID PANEL
Cholesterol: 140 mg/dL (ref 125–200)
HDL: 41 mg/dL (ref >=40)
LDL Cholesterol: 83 mg/dL (ref <130)
Total CHOL/HDL Ratio: 3.4 Ratio (ref <=5.0)
Triglycerides: 80 mg/dL (ref <150)
VLDL: 16 mg/dL (ref <30)

## 2015-05-07 LAB — HEPATIC FUNCTION PANEL
ALBUMIN: 4 g/dL (ref 3.6–5.1)
ALT: 21 U/L (ref 9–46)
AST: 17 U/L (ref 10–35)
Alkaline Phosphatase: 67 U/L (ref 40–115)
Bilirubin, Direct: 0.1 mg/dL (ref <=0.2)
Indirect Bilirubin: 0.5 mg/dL (ref 0.2–1.2)
Total Bilirubin: 0.6 mg/dL (ref 0.2–1.2)
Total Protein: 6.7 g/dL (ref 6.1–8.1)

## 2015-05-07 NOTE — Addendum Note (Signed)
Addended by: Eulis Foster on: 05/07/2015 08:33 AM   Modules accepted: Orders

## 2015-05-07 NOTE — Addendum Note (Signed)
Addended by: Eulis Foster on: 05/07/2015 08:34 AM   Modules accepted: Orders

## 2015-05-08 ENCOUNTER — Encounter: Payer: Self-pay | Admitting: Cardiovascular Disease

## 2015-05-08 ENCOUNTER — Ambulatory Visit (INDEPENDENT_AMBULATORY_CARE_PROVIDER_SITE_OTHER): Payer: Commercial Managed Care - HMO | Admitting: Cardiovascular Disease

## 2015-05-08 VITALS — BP 120/70 | HR 59 | Ht 69.0 in | Wt 250.0 lb

## 2015-05-08 DIAGNOSIS — I10 Essential (primary) hypertension: Secondary | ICD-10-CM | POA: Diagnosis not present

## 2015-05-08 DIAGNOSIS — I2510 Atherosclerotic heart disease of native coronary artery without angina pectoris: Secondary | ICD-10-CM | POA: Diagnosis not present

## 2015-05-08 DIAGNOSIS — E785 Hyperlipidemia, unspecified: Secondary | ICD-10-CM | POA: Diagnosis not present

## 2015-05-08 MED ORDER — NITROGLYCERIN 0.4 MG SL SUBL: 0.4000 mg | SUBLINGUAL_TABLET | SUBLINGUAL | Status: DC | PRN

## 2015-05-08 NOTE — Patient Instructions (Signed)
Medication Instructions:  Your physician recommends that you continue on your current medications as directed. Please refer to the Current Medication list given to you today.  Labwork: Your physician recommends that you return for a FASTING LIPID, CMP and CBC in 1 YEAR--nothing to eat or drink after midnight, lab opens at 7:30 AM  Testing/Procedures: No new orders.   Follow-Up: Your physician wants you to follow-up in: 1 YEAR with Dr Cooper.  You will receive a reminder letter in the mail two months in advance. If you don't receive a letter, please call our office to schedule the follow-up appointment.   Any Other Special Instructions Will Be Listed Below (If Applicable).     If you need a refill on your cardiac medications before your next appointment, please call your pharmacy.   

## 2015-05-08 NOTE — Progress Notes (Signed)
Cardiology Office Note Date:  05/08/2015   ID:  [name], DOB [birth year], MRN [id number]  PCP:  Leonides Grills, MD  Cardiologist:  [name], MD    Chief Complaint  Patient presents with  . Follow-up    CAD     History of Present Illness: [name] is a 60 y.o. male who presents for CAD s/p CABG in 2006, HTN, HL, asthma, GERD. He suffered a NSTEMI in 05/2013 and LHC demonstrated severe disease in S-PDA and S-Dx. The L-LAD and free RIMA-OM were patent. He underwent PCI to both SVGs with a DES. EF was preserved.   the patient is doing okay. He hasn't been getting any regular exercise , but otherwise is doing well. States he's been following a prudent diet. Denies any exertional symptoms. Denies chest pain, shortness of breath, lightheadedness, or heart palpitations. No bleeding problems on his current medical therapy. Reports compliance with medicines.   Past Medical History  Diagnosis Date  . CAD (coronary artery disease) 2000    a. History of MI ~2000 s/p PCI. b. CABG in 2006 (LIMA-LAD, RIMA-acute marginal, SVG-DX, SVG-RCA). c. NSTEMI 05/2013:severe stenoses of the SVG-PDA and SVG-diagonal, both treated successfully with PCI (DES x2).  Marland Kitchen History of hypertension   . Hyperlipidemia   . Obesity   . Hydrocele 2012  . Hypertension   . GERD (gastroesophageal reflux disease)   . Asthma     Past Surgical History  Procedure Laterality Date  . Coronary artery bypass grafting  May 2006    Four-vessel  . Pilonidal cyst excision    . Tonsillectomy    . Foot fracture surgery      right  . Cataract extraction w/phaco Left 05/17/2012    Procedure: CATARACT EXTRACTION PHACO AND INTRAOCULAR LENS PLACEMENT (IOC);  Surgeon: Tonny Branch, MD;  Location: [facility];  Service: Ophthalmology;  Laterality: Left;  CDE:  0.76  . Coronary angioplasty  2000  . Coronary artery bypass graft  2006    4 vessels  . Cataract extraction w/phaco Right 08/26/2012    Procedure: CATARACT  EXTRACTION PHACO AND INTRAOCULAR LENS PLACEMENT (IOC);  Surgeon: Tonny Branch, MD;  Location: [facility];  Service: Ophthalmology;  Laterality: Right;  CDE: 1.13  . Left heart catheterization with coronary/graft angiogram N/A 05/05/2011    Procedure: LEFT HEART CATHETERIZATION WITH Beatrix Fetters;  Surgeon: Josue Hector, MD;  Location: [facility] CATH LAB;  Service: Cardiovascular;  Laterality: N/A;  . Left heart catheterization with coronary/graft angiogram N/A 06/06/2013    Procedure: LEFT HEART CATHETERIZATION WITH Beatrix Fetters;  Surgeon: Blane Ohara, MD;  Location: [facility] CATH LAB;  Service: Cardiovascular;  Laterality: N/A;    Current Outpatient Prescriptions  Medication Sig Dispense Refill  . aspirin EC 81 MG tablet Take 81 mg by mouth daily.    Marland Kitchen atorvastatin (LIPITOR) 80 MG tablet Take 1 tablet (80 mg total) by mouth daily. 90 tablet 3  . clopidogrel (PLAVIX) 75 MG tablet Take 1 tablet (75 mg total) by mouth daily. 90 tablet 3  . hydrochlorothiazide (MICROZIDE) 12.5 MG capsule Take 1 capsule (12.5 mg total) by mouth daily. 90 capsule 3  . metoprolol tartrate (LOPRESSOR) 25 MG tablet TAKE 1 TABLET BY MOUTH TWICE A DAY - NEED MD APPT 60 tablet 6  . nitroGLYCERIN (NITROSTAT) 0.4 MG SL tablet Place 0.4 mg under the tongue every 5 (five) minutes as needed for chest pain.    . potassium chloride SA (K-DUR,KLOR-CON) 20 MEQ tablet Take  1 tablet (20 mEq total) by mouth daily. 30 tablet 11   No current facility-administered medications for this visit.    Allergies:   Hydrocodone   Social History:  The patient  reports that he has never smoked. He has never used smokeless tobacco. He reports that he does not drink alcohol or use illicit drugs.   Family History:  The patient's family history includes Cancer in his father and mother; Cervical cancer in his mother; Healthy in his daughter and son; Throat cancer in his father.    ROS:  Please see the history of present illness.   Otherwise, review of systems is positive for weight gain, leg pain.  All other systems are reviewed and negative.    PHYSICAL EXAM: VS:  BP 120/70 mmHg  Pulse 59  Ht 5' 9"  (1.753 m)  Wt 113.399 kg (250 lb)  BMI 36.90 kg/m2 , BMI Body mass index is 36.9 kg/(m^2). GEN: Well nourished, well developed, in no acute distress HEENT: normal Neck: no JVD, no masses. No carotid bruits Cardiac: RRR without murmur or gallop                Respiratory:  clear to auscultation bilaterally, normal work of breathing GI: soft, nontender, nondistended, + BS MS: no deformity or atrophy Ext: no pretibial edema, pedal pulses 2+= bilaterally Skin: warm and dry, no rash Neuro:  Strength and sensation are intact Psych: euthymic mood, full affect  EKG:  EKG is ordered today. The ekg ordered today shows sinus rhythm 59 bpm, incomplete RBBB, age-indeterminate inferior MI  Recent Labs: 06/28/2014: Hemoglobin 14.1; Platelets 236 05/07/2015: ALT 21; BUN 14; Creat 1.00; Potassium 3.8; Sodium 138   Lipid Panel     Component Value Date/Time   CHOL 140 05/07/2015 0834   TRIG 80 05/07/2015 0834   HDL 41 05/07/2015 0834   CHOLHDL 3.4 05/07/2015 0834   VLDL 16 05/07/2015 0834   LDLCALC 83 05/07/2015 0834      Wt Readings from Last 3 Encounters:  05/08/15 113.399 kg (250 lb)  10/19/14 110.587 kg (243 lb 12.8 oz)  07/02/14 109.317 kg (241 lb)     Cardiac Studies Reviewed: Cardiac Cath 06-06-2013: PROCEDURAL FINDINGS Hemodynamics: AO 136/69 LV 141/21  Coronary angiography: Coronary dominance: right  Left mainstem: calcified without obstructive disease  Left anterior descending (LAD): diffuse proximal disease with 80% ostial stenosis and 100% occlusion at the first septal perforator  Left circumflex (LCx): hypodense eccentric 70% stenosis in the proximal circumflex. OM1 has 80% stenosis, OM2 with diffuse nonobstructive disease  Right coronary artery (RCA): diffusely diseased in the  proximal RCA. Total 100% occlusion in the mid-vessel.  LIMA-LAD: patent without disease. The LAD is diffusely diseased, especially in the apical portion with 80% apical stenosis  Free RIMA - OM: patent without obstructive disease  SVG - diagonal: 95% stenosis in mid-body of graft  SVG - PDA: 95% stenosis in proximal body of graft  Left ventriculography: Left ventricular systolic function is normal, LVEF is estimated at 55-65%, there is no significant mitral regurgitation   PCI Note: Following the diagnostic procedure, the decision was made to proceed with PCI. I planned on performing PCI on both vein graft lesions. The vein graft to the diagonal appeared too small for distal protection. The patient was loaded with effient 60 mg on the table. Weight-based bivalirudin was given for anticoagulation. Once a therapeutic ACT was achieved, a 6 [place] AL-1 guide catheter was inserted. IC verapamil was administered. A cougar coronary  guidewire was used to cross the lesion. The lesion was predilated with a 2.0 mm balloon. The lesion was then stented with a 3.0x16 mm Promus DES stent. The stent was postdilated with a 3.25 mm noncompliant balloon to 14 atm. Following PCI, there was 0% residual stenosis and TIMI-3 flow. Attention was then turned to the SVG-PDA. A MPA catheter was used. The same cougar wire was advanced across the lesion. A 3 mm Spider embolic protection device was prepped using normal technique. The device however would not traverse the aortic arch and it pushed the guide out of position on several attempts despite guide catheter manipulations. The device was removed and IC verapamil was again administered. The lesion was predilated with the same 2.0 mm balloon and a 2.5x24 mm Promus DES was used to treat the lesion. The stent was deployed at 12 atm and post-dilated with a 2.75 mm balloon to 16 atm. There was 0% residual stenosis and TIMI-3 flow at the completion of the procedure. Final  angiography confirmed an excellent result. The patient tolerated the procedure well. There were no immediate procedural complications. A TR band was used for radial hemostasis. The patient was transferred to the post catheterization recovery area for further monitoring.  PCI Data: LESION 1 Vessel - diagonal 1/Segment - mid-body of SVG Percent Stenosis (pre) 95 TIMI-flow 3 Stent 3.0x16 mm Promus DES Percent Stenosis (post) 0 TIMI-flow (post) 3  LESION 2 Vessel - PDA/Segment - Proximal body of SVG Percent Stenosis (pre) 95 TIMI-flow 3 Stent 2.5x24 mm Promus DES Percent Stenosis (post) 0 TIMI-flow (post) 3  Final Conclusions:  1. Severe 3 vessel CAD with total occlusion of the LAD and RCA and severe stenosis of the LCx 2. S/P CABG with continued patency of the LIMA-LAD and free RIMA-OM 3. Severe stenoses of the SVG-PDA and SVG-diagonal, both treated successfully with PCI 4. Normal LV function   Recommendations:  ASA and effient at least 12 months. Anticipate d/c tomorrow if no complications.  [name]  ASSESSMENT AND PLAN: 1.  CAD, native vessel and bypass graft disease, with no angina at present. Medications are reviewed. I agree that he be continued on long-term dual antiplatelet therapy in the setting of multiple vein graft lesions requiring stenting in the past. Cardiac cath findings reviewed in detail with the patient today. He is currently doing well without exertional symptoms or functional limitation related to his heart disease. We spent most of our time focusing on necessary lifestyle modification. The patient was counseled regarding the need for regular exercise and efforts at weight loss. We discussed specific dietary modification as well.  2. Hyperlipidemia: The patient is treated with high-dose atorvastatin. His lipids are excellent and recent labs are reviewed with him today. He will continue the same. Lifestyle modification discussed.  3. Essential  hypertension: Blood pressure is well controlled. Normal renal function and electrolytes on a combination of hydrochlorothiazide and metoprolol.  Current medicines are reviewed with the patient today.  The patient does not have concerns regarding medicines.  Labs/ tests ordered today include:  No orders of the defined types were placed in this encounter.    Disposition:   FU one year  Signed, [name], MD  05/08/2015 2:21 PM    [address], [place], [place]  [address] Phone: [phone]; Fax: [phone]

## 2015-05-11 ENCOUNTER — Ambulatory Visit: Payer: Commercial Managed Care - HMO | Admitting: Nurse Practitioner

## 2015-10-23 ENCOUNTER — Other Ambulatory Visit: Payer: Self-pay | Admitting: Physician Assistant

## 2015-10-23 DIAGNOSIS — I25810 Atherosclerosis of coronary artery bypass graft(s) without angina pectoris: Secondary | ICD-10-CM

## 2015-11-14 ENCOUNTER — Emergency Department (HOSPITAL_COMMUNITY): Payer: Commercial Managed Care - HMO

## 2015-11-14 ENCOUNTER — Emergency Department (HOSPITAL_COMMUNITY)
Admission: EM | Admit: 2015-11-14 | Discharge: 2015-11-14 | Disposition: A | Discharge status: Home / Self Care | Payer: Commercial Managed Care - HMO | Attending: Emergency Medicine | Admitting: Emergency Medicine

## 2015-11-14 ENCOUNTER — Encounter (HOSPITAL_COMMUNITY): Payer: Self-pay | Admitting: Emergency Medicine

## 2015-11-14 DIAGNOSIS — Z79899 Other long term (current) drug therapy: Secondary | ICD-10-CM | POA: Diagnosis not present

## 2015-11-14 DIAGNOSIS — I10 Essential (primary) hypertension: Secondary | ICD-10-CM | POA: Insufficient documentation

## 2015-11-14 DIAGNOSIS — J45909 Unspecified asthma, uncomplicated: Secondary | ICD-10-CM | POA: Insufficient documentation

## 2015-11-14 DIAGNOSIS — M10072 Idiopathic gout, left ankle and foot: Secondary | ICD-10-CM | POA: Diagnosis not present

## 2015-11-14 DIAGNOSIS — Z7982 Long term (current) use of aspirin: Secondary | ICD-10-CM | POA: Diagnosis not present

## 2015-11-14 DIAGNOSIS — M109 Gout, unspecified: Secondary | ICD-10-CM

## 2015-11-14 DIAGNOSIS — I2510 Atherosclerotic heart disease of native coronary artery without angina pectoris: Secondary | ICD-10-CM | POA: Insufficient documentation

## 2015-11-14 DIAGNOSIS — M7989 Other specified soft tissue disorders: Secondary | ICD-10-CM | POA: Diagnosis present

## 2015-11-14 MED ORDER — ONDANSETRON 4 MG PO TBDP
4.0000 mg | ORAL_TABLET | Freq: Once | ORAL | Status: AC
  Administered 2015-11-14: 4 mg via ORAL
  Filled 2015-11-14: qty 1

## 2015-11-14 MED ORDER — HYDROCODONE-ACETAMINOPHEN 5-325 MG PO TABS: 1.0000 | ORAL_TABLET | Freq: Four times a day (QID) | ORAL | 0 refills | Status: DC | PRN

## 2015-11-14 MED ORDER — PREDNISONE 50 MG PO TABS
60.0000 mg | ORAL_TABLET | Freq: Once | ORAL | Status: AC
  Administered 2015-11-14: 60 mg via ORAL
  Filled 2015-11-14: qty 1

## 2015-11-14 MED ORDER — ONDANSETRON 4 MG PO TBDP: 4.0000 mg | ORAL_TABLET | Freq: Three times a day (TID) | ORAL | 1 refills | Status: DC | PRN

## 2015-11-14 MED ORDER — PREDNISONE 10 MG PO TABS: 40.0000 mg | ORAL_TABLET | Freq: Every day | ORAL | 0 refills | Status: DC

## 2015-11-14 MED ORDER — KETOROLAC TROMETHAMINE 60 MG/2ML IM SOLN
60.0000 mg | Freq: Once | INTRAMUSCULAR | Status: AC
  Administered 2015-11-14: 60 mg via INTRAMUSCULAR
  Filled 2015-11-14: qty 2

## 2015-11-14 NOTE — Discharge Instructions (Signed)
Take the prednisone as directed. Take the hydrocodone along with the antinausea medicine Zofran for pain control. Expect improvement over the next couple days. Make an appoint follow-up with your regular doctor. Return for any new or worse symptoms.

## 2015-11-14 NOTE — ED Provider Notes (Signed)
[facility] DEPT Provider Note   CSN: [id number] Arrival date & time: 11/14/15  1615     History   Chief Complaint Chief Complaint  Patient presents with  . Leg Swelling    HPI [name] is a 60 y.o. male.  A patient with onset of the bilateral foot pain on Monday "greater than right. Now has some redness to the left foot. Similar symptoms in the past thought maybe to be gout or arthritis. But never proven. Patient having difficulty walking due to the degree of pain. Patient denies any injury. Patient did not take any medicine at home for it.      Past Medical History:  Diagnosis Date  . Asthma   . CAD (coronary artery disease) 2000   a. History of MI ~2000 s/p PCI. b. CABG in 2006 (LIMA-LAD, RIMA-acute marginal, SVG-DX, SVG-RCA). c. NSTEMI 05/2013:severe stenoses of the SVG-PDA and SVG-diagonal, both treated successfully with PCI (DES x2).  Marland Kitchen GERD (gastroesophageal reflux disease)   . History of hypertension   . Hydrocele 2012  . Hyperlipidemia   . Hypertension   . Obesity     Patient Active Problem List   Diagnosis Date Noted  . CAD (coronary artery disease)   . Obesity   . NSTEMI (non-ST elevated myocardial infarction) ([place]) 06/06/2013  . GERD (gastroesophageal reflux disease) 07/12/2012  . Fatty liver 07/07/2011  . HLD (hyperlipidemia) 08/22/2008  . Essential hypertension 08/22/2008    Past Surgical History:  Procedure Laterality Date  . CATARACT EXTRACTION W/PHACO Left 05/17/2012   Procedure: CATARACT EXTRACTION PHACO AND INTRAOCULAR LENS PLACEMENT (IOC);  Surgeon: Tonny Branch, MD;  Location: [facility]S;  Service: Ophthalmology;  Laterality: Left;  CDE:  0.76  . CATARACT EXTRACTION W/PHACO Right 08/26/2012   Procedure: CATARACT EXTRACTION PHACO AND INTRAOCULAR LENS PLACEMENT (IOC);  Surgeon: Tonny Branch, MD;  Location: [facility];  Service: Ophthalmology;  Laterality: Right;  CDE: 1.13  . CORONARY ANGIOPLASTY  2000  . CORONARY ARTERY BYPASS GRAFT  2006   4  vessels  . Coronary artery bypass grafting  May 2006   Four-vessel  . FOOT FRACTURE SURGERY     right  . LEFT HEART CATHETERIZATION WITH CORONARY/GRAFT ANGIOGRAM N/A 05/05/2011   Procedure: LEFT HEART CATHETERIZATION WITH Beatrix Fetters;  Surgeon: Josue Hector, MD;  Location: [facility] CATH LAB;  Service: Cardiovascular;  Laterality: N/A;  . LEFT HEART CATHETERIZATION WITH CORONARY/GRAFT ANGIOGRAM N/A 06/06/2013   Procedure: LEFT HEART CATHETERIZATION WITH Beatrix Fetters;  Surgeon: Blane Ohara, MD;  Location: [facility] CATH LAB;  Service: Cardiovascular;  Laterality: N/A;  . PILONIDAL CYST EXCISION    . TONSILLECTOMY         Home Medications    Prior to Admission medications   Medication Sig Start Date End Date Taking? Authorizing Provider  aspirin EC 81 MG tablet Take 81 mg by mouth daily.   Yes Historical Provider, MD  atorvastatin (LIPITOR) 80 MG tablet Take 1 tablet (80 mg total) by mouth daily. 10/19/14  Yes Liliane Shi, PA-C  clopidogrel (PLAVIX) 75 MG tablet TAKE 1 TABLET BY MOUTH EVERY DAY 10/23/15  Yes Robt Okuda Joylene Draft, PA-C  hydrochlorothiazide (MICROZIDE) 12.5 MG capsule Take 1 capsule (12.5 mg total) by mouth daily. 10/19/14  Yes Liliane Shi, PA-C  metoprolol tartrate (LOPRESSOR) 25 MG tablet TAKE 1 TABLET BY MOUTH TWICE A DAY - NEED MD APPT 11/07/14  Yes [name], MD  nitroGLYCERIN (NITROSTAT) 0.4 MG SL tablet Place 1 tablet (0.4 mg  total) under the tongue every 5 (five) minutes as needed for chest pain. 05/08/15  Yes [name], MD  potassium chloride SA (K-DUR,KLOR-CON) 20 MEQ tablet Take 1 tablet (20 mEq total) by mouth daily. 11/03/14  Yes Liliane Shi, PA-C  HYDROcodone-acetaminophen (NORCO/VICODIN) 5-325 MG tablet Take 1-2 tablets by mouth every 6 (six) hours as needed. 11/14/15   Fredia Sorrow, MD  ondansetron (ZOFRAN ODT) 4 MG disintegrating tablet Take 1 tablet (4 mg total) by mouth every 8 (eight) hours as needed. 11/14/15   Fredia Sorrow, MD    predniSONE (DELTASONE) 10 MG tablet Take 4 tablets (40 mg total) by mouth daily. 11/14/15   Fredia Sorrow, MD    Family History Family History  Problem Relation Age of Onset  . Cancer Mother   . Cervical cancer Mother   . Cancer Father   . Throat cancer Father   . Healthy Daughter   . Healthy Son     Social History Social History  Substance Use Topics  . Smoking status: Never Smoker  . Smokeless tobacco: Never Used  . Alcohol use No     Allergies   Hydrocodone   Review of Systems Review of Systems  Constitutional: Negative for fever.  HENT: Negative for congestion.   Respiratory: Negative for shortness of breath.   Cardiovascular: Negative for chest pain.  Gastrointestinal: Negative for abdominal pain.  Genitourinary: Negative for dysuria.  Musculoskeletal: Positive for arthralgias. Negative for back pain.  Skin: Negative for rash.  Neurological: Negative for headaches.  Hematological: Does not bruise/bleed easily.  Psychiatric/Behavioral: Negative for confusion.     Physical Exam Updated Vital Signs BP 126/77   Pulse 90   Temp 99.3 F (37.4 C) (Oral)   Resp 18   Ht 5\' 9"  (1.753 m)   Wt 108.9 kg   SpO2 95%   BMI 35.44 kg/m   Physical Exam  Constitutional: He is oriented to person, place, and time. He appears well-developed and well-nourished. No distress.  HENT:  Head: Normocephalic and atraumatic.  Mouth/Throat: Oropharynx is clear and moist.  Eyes: Conjunctivae and EOM are normal. Pupils are equal, round, and reactive to light.  Neck: Normal range of motion. Neck supple.  Cardiovascular: Normal rate, regular rhythm and normal heart sounds.   No murmur heard. Pulmonary/Chest: Effort normal and breath sounds normal. No respiratory distress.  Abdominal: Soft. Bowel sounds are normal. There is no tenderness.  Musculoskeletal: Normal range of motion. He exhibits edema and tenderness.  Left forefoot with an area of redness measuring about 5 cm  swelling in increased warmth. Refill to both feet and toes is less than 2 seconds.  Right foot is normal clinically  Neurological: He is alert and oriented to person, place, and time. He displays normal reflexes. No cranial nerve deficit. He exhibits normal muscle tone.  Skin: Skin is warm. Capillary refill takes less than 2 seconds. There is erythema.  Nursing note and vitals reviewed.    ED Treatments / Results  Labs (all labs ordered are listed, but only abnormal results are displayed) Labs Reviewed - No data to display  EKG  EKG Interpretation None       Radiology Dg Foot Complete Left  Result Date: 11/14/2015 CLINICAL DATA:  Left foot swelling for 1 week with no injury. EXAM: LEFT FOOT - COMPLETE 3+ VIEW COMPARISON:  None. FINDINGS: No acute fracture or subluxation. There are juxta-articular corticated erosions about the first MTP joint on the metatarsal head. No soft tissue calcified mass.  No notable arthritic narrowing. Heel spurs. IMPRESSION: 1. No acute finding. 2. Gouty type erosions at the first MTP. Electronically Signed   By: Monte Fantasia M.D.   On: 11/14/2015 18:50    Procedures Procedures (including critical care time)  Medications Ordered in ED Medications  ketorolac (TORADOL) injection 60 mg (60 mg Intramuscular Given 11/14/15 1924)  ondansetron (ZOFRAN-ODT) disintegrating tablet 4 mg (4 mg Oral Given 11/14/15 1924)  predniSONE (DELTASONE) tablet 60 mg (60 mg Oral Given 11/14/15 1924)     Initial Impression / Assessment and Plan / ED Course  I have reviewed the triage vital signs and the nursing notes.  Pertinent labs & imaging results that were available during my care of the patient were reviewed by me and considered in my medical decision making (see chart for details).  Clinical Course    Patient with complaint of bilateral foot pain left greater than right. Symptoms started on Monday. Patient having difficulty walking. Patient is somewhat symptoms  in the past to perhaps be arthritis or gout but never proven. X-rays today are consistent with gouty arthritis. Will treat patient with prednisone for anti-inflammatory control hydrocodone for pain. Normally her trouble with nausea and vomiting will try Zofran with that. Patient received Toradol here IM with significant improvement. Gout flare seems to be predominantly related to the left forefoot. Patient also with some right proximal toe pain but no evidence of any inflammation there but possible that could be a mild attack.  Final Clinical Impressions(s) / ED Diagnoses   Final diagnoses:  Acute gout of left foot, unspecified cause    New Prescriptions New Prescriptions   HYDROCODONE-ACETAMINOPHEN (NORCO/VICODIN) 5-325 MG TABLET    Take 1-2 tablets by mouth every 6 (six) hours as needed.   ONDANSETRON (ZOFRAN ODT) 4 MG DISINTEGRATING TABLET    Take 1 tablet (4 mg total) by mouth every 8 (eight) hours as needed.   PREDNISONE (DELTASONE) 10 MG TABLET    Take 4 tablets (40 mg total) by mouth daily.     Fredia Sorrow, MD 11/14/15 2044

## 2015-11-14 NOTE — ED Triage Notes (Signed)
Pt reports bilateral foot pain and swelling since Monday, pt was not able to take HCTZ today due to not being able to walk, pulses and sensation present.

## 2015-11-14 NOTE — ED Notes (Signed)
MD at bedside. 

## 2015-11-25 ENCOUNTER — Other Ambulatory Visit: Payer: Self-pay | Admitting: Physician Assistant

## 2015-11-25 ENCOUNTER — Other Ambulatory Visit: Payer: Self-pay | Admitting: Cardiovascular Disease

## 2015-11-27 ENCOUNTER — Other Ambulatory Visit: Payer: Self-pay | Admitting: *Deleted

## 2015-11-27 MED ORDER — METOPROLOL TARTRATE 25 MG PO TABS: ORAL_TABLET | ORAL | 6 refills | Status: DC

## 2015-11-27 MED ORDER — POTASSIUM CHLORIDE CRYS ER 20 MEQ PO TBCR: 20.0000 meq | EXTENDED_RELEASE_TABLET | Freq: Every day | ORAL | 6 refills | Status: DC

## 2015-11-28 ENCOUNTER — Other Ambulatory Visit: Payer: Self-pay | Admitting: Physician Assistant

## 2015-11-28 DIAGNOSIS — I10 Essential (primary) hypertension: Secondary | ICD-10-CM

## 2015-11-28 DIAGNOSIS — E785 Hyperlipidemia, unspecified: Secondary | ICD-10-CM

## 2016-01-03 ENCOUNTER — Other Ambulatory Visit: Payer: Self-pay | Admitting: Physician Assistant

## 2016-01-03 DIAGNOSIS — I10 Essential (primary) hypertension: Secondary | ICD-10-CM

## 2016-01-03 DIAGNOSIS — E785 Hyperlipidemia, unspecified: Secondary | ICD-10-CM

## 2016-01-14 ENCOUNTER — Other Ambulatory Visit: Payer: Self-pay | Admitting: *Deleted

## 2016-01-14 MED ORDER — METOPROLOL TARTRATE 25 MG PO TABS: ORAL_TABLET | ORAL | 0 refills | Status: DC

## 2016-01-23 ENCOUNTER — Other Ambulatory Visit: Payer: Self-pay | Admitting: *Deleted

## 2016-01-23 MED ORDER — POTASSIUM CHLORIDE CRYS ER 20 MEQ PO TBCR: 20.0000 meq | EXTENDED_RELEASE_TABLET | Freq: Every day | ORAL | 0 refills | Status: DC

## 2016-04-05 ENCOUNTER — Other Ambulatory Visit: Payer: Self-pay | Admitting: Cardiovascular Disease

## 2016-04-05 DIAGNOSIS — I10 Essential (primary) hypertension: Secondary | ICD-10-CM

## 2016-04-05 DIAGNOSIS — E785 Hyperlipidemia, unspecified: Secondary | ICD-10-CM

## 2016-04-14 ENCOUNTER — Ambulatory Visit (INDEPENDENT_AMBULATORY_CARE_PROVIDER_SITE_OTHER): Payer: Commercial Managed Care - HMO

## 2016-04-14 ENCOUNTER — Ambulatory Visit (INDEPENDENT_AMBULATORY_CARE_PROVIDER_SITE_OTHER): Payer: Commercial Managed Care - HMO | Admitting: Orthopedic Surgery

## 2016-04-14 VITALS — BP 128/68 | HR 56 | Ht 69.0 in | Wt 250.0 lb

## 2016-04-14 DIAGNOSIS — M25521 Pain in right elbow: Secondary | ICD-10-CM | POA: Diagnosis not present

## 2016-04-14 DIAGNOSIS — M10021 Idiopathic gout, right elbow: Secondary | ICD-10-CM | POA: Diagnosis not present

## 2016-04-14 NOTE — Patient Instructions (Signed)
OUR OFFICE WILL CALL YOU AFTER DR Stark Jock WITH DR Burt Knack

## 2016-04-14 NOTE — Progress Notes (Signed)
Patient ID: [name], male   DOB: September 25, 1955, 61 y.o.   MRN: [id number]  Chief Complaint  Patient presents with  . Joint Swelling    Right elbow pain.    HPI [name] is a 61 y.o. male.  Presents for evaluation of right elbow with right elbow pain and swelling. Present for several years in terms of the swelling but the pain just started recently. No prior treatment  Review of Systems Review of Systems 1. No fever 2. No rash over the elbow   Past Medical History:  Diagnosis Date  . Asthma   . CAD (coronary artery disease) 2000   a. History of MI ~2000 s/p PCI. b. CABG in 2006 (LIMA-LAD, RIMA-acute marginal, SVG-DX, SVG-RCA). c. NSTEMI 05/2013:severe stenoses of the SVG-PDA and SVG-diagonal, both treated successfully with PCI (DES x2).  Marland Kitchen GERD (gastroesophageal reflux disease)   . History of hypertension   . Hydrocele 2012  . Hyperlipidemia   . Hypertension   . Obesity     Past Surgical History:  Procedure Laterality Date  . CATARACT EXTRACTION W/PHACO Left 05/17/2012   Procedure: CATARACT EXTRACTION PHACO AND INTRAOCULAR LENS PLACEMENT (IOC);  Surgeon: Tonny Branch, MD;  Location: [facility] ORS;  Service: Ophthalmology;  Laterality: Left;  CDE:  0.76  . CATARACT EXTRACTION W/PHACO Right 08/26/2012   Procedure: CATARACT EXTRACTION PHACO AND INTRAOCULAR LENS PLACEMENT (IOC);  Surgeon: Tonny Branch, MD;  Location: [facility] ORS;  Service: Ophthalmology;  Laterality: Right;  CDE: 1.13  . CORONARY ANGIOPLASTY  2000  . CORONARY ARTERY BYPASS GRAFT  2006   4 vessels  . Coronary artery bypass grafting  May 2006   Four-vessel  . FOOT FRACTURE SURGERY     right  . LEFT HEART CATHETERIZATION WITH CORONARY/GRAFT ANGIOGRAM N/A 05/05/2011   Procedure: LEFT HEART CATHETERIZATION WITH Beatrix Fetters;  Surgeon: Josue Hector, MD;  Location: [facility] CATH LAB;  Service: Cardiovascular;  Laterality: N/A;  . LEFT HEART CATHETERIZATION WITH CORONARY/GRAFT ANGIOGRAM N/A 06/06/2013   Procedure: LEFT  HEART CATHETERIZATION WITH Beatrix Fetters;  Surgeon: Blane Ohara, MD;  Location: [facility] CATH LAB;  Service: Cardiovascular;  Laterality: N/A;  . PILONIDAL CYST EXCISION    . TONSILLECTOMY      Social History Social History  Substance Use Topics  . Smoking status: Never Smoker  . Smokeless tobacco: Never Used  . Alcohol use No    Allergies  Allergen Reactions  . Hydrocodone Nausea Only    No outpatient prescriptions have been marked as taking for the 04/14/16 encounter (Office Visit) with Carole Civil, MD.      Physical Exam Physical Exam BP 128/68   Pulse (!) 56   Ht 5\' 9"  (1.753 m)   Wt 250 lb (113.4 kg)   BMI 36.92 kg/m   Gen. appearance. The patient is well-developed and well-nourished, grooming and hygiene are normal. There are no gross congenital abnormalities  The patient is alert and oriented to person place and time  Mood and affect are normal  Ambulation Normal  Examination reveals the following: On inspection we find right elbow, Large firm mass posterior aspect of right elbow nontender   With the range of motion of  135  Stability tests were normal   Strength tests revealed grade 5 motor strength  Skin we find no rash ulceration or erythema  Sensation remains intact  Impression vascular system shows no peripheral edema  Data Reviewed Plain films of the right elbow AP and lateral  Assessment  Encounter Diagnoses  Name Primary?  . Right elbow pain Yes  . Idiopathic gout of right elbow, unspecified chronicity        Plan    I will speak with Dr. [name] regarding management of Plavix in the setting of a stent to see if he can come off of the medication to get the mass removed       [name] 04/14/2016, 10:02 AM

## 2016-04-24 ENCOUNTER — Telehealth: Payer: Self-pay | Admitting: Orthopedic Surgery

## 2016-04-24 NOTE — Telephone Encounter (Signed)
Routing to Dr Harrison 

## 2016-04-24 NOTE — Telephone Encounter (Signed)
[name] or someone from this office was to speak with Dr. Sherren [name] regarding management of Plavix in the setting of a stent to see if he can come off of the medication to get the mass removed.  [name] wants to  know if this has been done?  Thanks

## 2016-04-28 NOTE — Telephone Encounter (Signed)
Dr Burt Knack routed a staff message to Dr Aline Brochure on 04/14/16:    [name], MD  Carole Civil, MD  Cc: Barkley Boards, RN        Dr Aline Brochure - if his bleeding risk isn't too high, he could hold plavix for 5 days, continue low-dose aspirin, and resume plavix when safe from your perspective post-operatively.   Thanks - Ronalee Belts

## 2016-05-05 ENCOUNTER — Telehealth: Payer: Self-pay | Admitting: Orthopedic Surgery

## 2016-05-05 NOTE — Telephone Encounter (Signed)
[name] is wanting to speak with you in reference to Dr. Aline Brochure speaking with Dr. Burt Knack. I saw where there was some information, but felt that this should be given to him by a nurse or Dr. Aline Brochure, just in case he has any questions about the information.   Please call and advise.

## 2016-05-06 ENCOUNTER — Other Ambulatory Visit: Payer: Self-pay | Admitting: *Deleted

## 2016-05-06 NOTE — Telephone Encounter (Signed)
SURGERY SCHEDULED FOR 05/14/16 PATIENT AWARE AND AWARE OF PLAVIX INSTRUCTIONS

## 2016-05-08 ENCOUNTER — Encounter: Payer: Self-pay | Admitting: *Deleted

## 2016-05-08 NOTE — Patient Instructions (Signed)
LISLE SAFRANSKI  05/08/2016     @PREFPERIOPPHARMACY @   Your procedure is scheduled on  05/14/2016  Report to [facility] at  1000  A.M.  Call this number if you have problems the morning of surgery:  [phone]   Remember:  Do not eat food or drink liquids after midnight.  Take these medicines the morning of surgery with A SIP OF WATER  Colchicine, hydrocodone, indocin, metoprolol, zofran, deltasone.   Do not wear jewelry, make-up or nail polish.  Do not wear lotions, powders, or perfumes, or deoderant.  Do not shave 48 hours prior to surgery.  Men may shave face and neck.  Do not bring valuables to the hospital.  [facility] is not responsible for any belongings or valuables.  Contacts, dentures or bridgework may not be worn into surgery.  Leave your suitcase in the car.  After surgery it may be brought to your room.  For patients admitted to the hospital, discharge time will be determined by your treatment team.  Patients discharged the day of surgery will not be allowed to drive home.   Name and phone number of your driver:   family Special instructions:  None  Please read over the following fact sheets that you were given. Anesthesia Post-op Instructions and Care and Recovery After Surgery       Incision Care, Adult An incision is a cut that a doctor makes in your skin for surgery (for a procedure). Most times, these cuts are closed after surgery. Your cut from surgery may be closed with stitches (sutures), staples, skin glue, or skin tape (adhesive strips). You may need to return to your doctor to have stitches or staples taken out. This may happen many days or many weeks after your surgery. The cut needs to be well cared for so it does not get infected. How to care for your cut Cut care  Follow instructions from your doctor about how to take care of your cut. Make sure you:  Wash your hands with soap and water before you change your bandage (dressing). If  you cannot use soap and water, use hand sanitizer.  Change your bandage as told by your doctor.  Leave stitches, skin glue, or skin tape in place. They may need to stay in place for 2 weeks or longer. If tape strips get loose and curl up, you may trim the loose edges. Do not remove tape strips completely unless your doctor says it is okay.  Check your cut area every day for signs of infection. Check for:  More redness, swelling, or pain.  More fluid or blood.  Warmth.  Pus or a bad smell.  Ask your doctor how to clean the cut. This may include:  Using mild soap and water.  Using a clean towel to pat the cut dry after you clean it.  Putting a cream or ointment on the cut. Do this only as told by your doctor.  Covering the cut with a clean bandage.  Ask your doctor when you can leave the cut uncovered.  Do not take baths, swim, or use a hot tub until your doctor says it is okay. Ask your doctor if you can take showers. You may only be allowed to take sponge baths for bathing. Medicines  If you were prescribed an antibiotic medicine, cream, or ointment, take the antibiotic or put it on the cut as told by your doctor. Do not stop taking or  putting on the antibiotic even if your condition gets better.  Take over-the-counter and prescription medicines only as told by your doctor. General instructions  Limit movement around your cut. This helps healing.  Avoid straining, lifting, or exercise for the first month, or for as long as told by your doctor.  Follow instructions from your doctor about going back to your normal activities.  Ask your doctor what activities are safe.  Protect your cut from the sun when you are outside for the first 6 months, or for as long as told by your doctor. Put on sunscreen around the scar or cover up the scar.  Keep all follow-up visits as told by your doctor. This is important. Contact a doctor if:  Your have more redness, swelling, or pain  around the cut.  You have more fluid or blood coming from the cut.  Your cut feels warm to the touch.  You have pus or a bad smell coming from the cut.  You have a fever or shaking chills.  You feel sick to your stomach (nauseous) or you throw up (vomit).  You are dizzy.  Your stitches or staples come undone. Get help right away if:  You have a red streak coming from your cut.  Your cut bleeds through the bandage and the bleeding does not stop with gentle pressure.  The edges of your cut open up and separate.  You have very bad (severe) pain.  You have a rash.  You are confused.  You pass out (faint).  You have trouble breathing and you have a fast heartbeat. This information is not intended to replace advice given to you by your health care provider. Make sure you discuss any questions you have with your health care provider. Document Released: 06/02/2011 Document Revised: 11/16/2015 Document Reviewed: 11/16/2015 Elsevier Interactive Patient Education  2017 [employer] PATIENT INSTRUCTIONS POST-ANESTHESIA  IMMEDIATELY FOLLOWING SURGERY:  Do not drive or operate machinery for the first twenty four hours after surgery.  Do not make any important decisions for twenty four hours after surgery or while taking narcotic pain medications or sedatives.  If you develop intractable nausea and vomiting or a severe headache please notify your doctor immediately.  FOLLOW-UP:  Please make an appointment with your surgeon as instructed. You do not need to follow up with anesthesia unless specifically instructed to do so.  WOUND CARE INSTRUCTIONS (if applicable):  Keep a dry clean dressing on the anesthesia/puncture wound site if there is drainage.  Once the wound has quit draining you may leave it open to air.  Generally you should leave the bandage intact for twenty four hours unless there is drainage.  If the epidural site drains for more than 36-48 hours please call the anesthesia  department.  QUESTIONS?:  Please feel free to call your physician or the hospital operator if you have any questions, and they will be happy to assist you.

## 2016-05-08 NOTE — Progress Notes (Signed)
Per patient plan surgical pre authorization not required for CPT 24076   REFERENCE [id number]

## 2016-05-09 ENCOUNTER — Encounter (HOSPITAL_COMMUNITY)
Admission: RE | Admit: 2016-05-09 | Discharge: 2016-05-09 | Disposition: A | Discharge status: Home / Self Care | Payer: Commercial Managed Care - HMO | Source: Ambulatory Visit | Attending: Orthopedic Surgery | Admitting: Orthopedic Surgery

## 2016-05-09 ENCOUNTER — Encounter (HOSPITAL_COMMUNITY): Payer: Self-pay

## 2016-05-09 ENCOUNTER — Other Ambulatory Visit: Payer: Self-pay

## 2016-05-09 DIAGNOSIS — Z01810 Encounter for preprocedural cardiovascular examination: Secondary | ICD-10-CM | POA: Diagnosis not present

## 2016-05-09 DIAGNOSIS — Z01812 Encounter for preprocedural laboratory examination: Secondary | ICD-10-CM | POA: Insufficient documentation

## 2016-05-09 HISTORY — DX: Gout, unspecified: M10.9

## 2016-05-09 HISTORY — DX: Unspecified osteoarthritis, unspecified site: M19.90

## 2016-05-09 LAB — CBC WITH DIFFERENTIAL/PLATELET
BASOS ABS: 0 10*3/uL (ref 0.0–0.1)
BASOS PCT: 1 %
Eosinophils Absolute: 0.1 10*3/uL (ref 0.0–0.7)
Eosinophils Relative: 1 %
HEMATOCRIT: 39.4 % (ref 39.0–52.0)
Hemoglobin: 13.8 g/dL (ref 13.0–17.0)
Lymphocytes Relative: 32 %
Lymphs Abs: 1.9 10*3/uL (ref 0.7–4.0)
MCH: 30.5 pg (ref 26.0–34.0)
MCHC: 35 g/dL (ref 30.0–36.0)
MCV: 87.2 fL (ref 78.0–100.0)
MONO ABS: 0.7 10*3/uL (ref 0.1–1.0)
Monocytes Relative: 11 %
NEUTROS ABS: 3.2 10*3/uL (ref 1.7–7.7)
NEUTROS PCT: 55 %
Platelets: 231 10*3/uL (ref 150–400)
RBC: 4.52 MIL/uL (ref 4.22–5.81)
RDW: 12.5 % (ref 11.5–15.5)
WBC: 5.9 10*3/uL (ref 4.0–10.5)

## 2016-05-09 LAB — BASIC METABOLIC PANEL
ANION GAP: 10 (ref 5–15)
BUN: 16 mg/dL (ref 6–20)
CALCIUM: 9.2 mg/dL (ref 8.9–10.3)
CO2: 25 mmol/L (ref 22–32)
Chloride: 101 mmol/L (ref 101–111)
Creatinine, Ser: 0.91 mg/dL (ref 0.61–1.24)
GFR calc non Af Amer: >60 mL/min (ref >60)
Glucose, Bld: 149 mg/dL — ABNORMAL HIGH (ref 65–99)
Potassium: 3.5 mmol/L (ref 3.5–5.1)
Sodium: 136 mmol/L (ref 135–145)

## 2016-05-09 NOTE — Progress Notes (Signed)
   05/09/16 0925  OBSTRUCTIVE SLEEP APNEA  Have you ever been diagnosed with sleep apnea through a sleep study? No  Do you snore loudly (loud enough to be heard through closed doors)?  1  Do you often feel tired, fatigued, or sleepy during the daytime (such as falling asleep during driving or talking to someone)? 1  Has anyone observed you stop breathing during your sleep? 1  Do you have, or are you being treated for high blood pressure? 1  BMI more than 35 kg/m2? 1  Age > 50 (1-yes) 1  Neck circumference greater than:Male 16 inches or larger, Male 17inches or larger? 1  Male Gender (Yes=1) 1  Obstructive Sleep Apnea Score 8  Score 5 or greater  Results sent to PCP

## 2016-05-13 NOTE — H&P (Signed)
History and physical for outpatient surgery  Chief Complaint  Patient presents with  . Joint Swelling      Right elbow pain.      HPI [name] is a 61 y.o. male.  Presents for Surgery to remove a mass over the right elbow. His initial symptoms included right elbow pain and swelling. Present for several years in terms of the swelling but the pain just started recently. No prior treatment   Review of Systems  Constitutional: Negative for chills, fever and weight loss.  Respiratory: Negative for shortness of breath.   Cardiovascular: Negative for chest pain.  Neurological: Negative for tingling.  All other systems reviewed and are negative.          Past Medical History:  Diagnosis Date  . Asthma    . CAD (coronary artery disease) 2000    a. History of MI ~2000 s/p PCI. b. CABG in 2006 (LIMA-LAD, RIMA-acute marginal, SVG-DX, SVG-RCA). c. NSTEMI 05/2013:severe stenoses of the SVG-PDA and SVG-diagonal, both treated successfully with PCI (DES x2).  Marland Kitchen GERD (gastroesophageal reflux disease)    . History of hypertension    . Hydrocele 2012  . Hyperlipidemia    . Hypertension    . Obesity             Past Surgical History:  Procedure Laterality Date  . CATARACT EXTRACTION W/PHACO Left 05/17/2012    Procedure: CATARACT EXTRACTION PHACO AND INTRAOCULAR LENS PLACEMENT (IOC);  Surgeon: Tonny Branch, MD;  Location: [facility];  Service: Ophthalmology;  Laterality: Left;  CDE:  0.76  . CATARACT EXTRACTION W/PHACO Right 08/26/2012    Procedure: CATARACT EXTRACTION PHACO AND INTRAOCULAR LENS PLACEMENT (IOC);  Surgeon: Tonny Branch, MD;  Location: [facility];  Service: Ophthalmology;  Laterality: Right;  CDE: 1.13  . CORONARY ANGIOPLASTY   2000  . CORONARY ARTERY BYPASS GRAFT   2006    4 vessels  . Coronary artery bypass grafting   May 2006    Four-vessel  . FOOT FRACTURE SURGERY        right  . LEFT HEART CATHETERIZATION WITH CORONARY/GRAFT ANGIOGRAM N/A 05/05/2011    Procedure: LEFT HEART  CATHETERIZATION WITH Beatrix Fetters;  Surgeon: Josue Hector, MD;  Location: [facility] CATH LAB;  Service: Cardiovascular;  Laterality: N/A;  . LEFT HEART CATHETERIZATION WITH CORONARY/GRAFT ANGIOGRAM N/A 06/06/2013    Procedure: LEFT HEART CATHETERIZATION WITH Beatrix Fetters;  Surgeon: Blane Ohara, MD;  Location: [facility] CATH LAB;  Service: Cardiovascular;  Laterality: N/A;  . PILONIDAL CYST EXCISION      . TONSILLECTOMY          Social History     Social History  Substance Use Topics  . Smoking status: Never Smoker  . Smokeless tobacco: Never Used  . Alcohol use No          Allergies  Allergen Reactions  . Hydrocodone Nausea Only     No current facility-administered medications for this encounter.   Current Outpatient Prescriptions:  .  aspirin EC 81 MG tablet, Take 81 mg by mouth daily., Disp: , Rfl:  .  atorvastatin (LIPITOR) 80 MG tablet, TAKE 1 TABLET (80 MG TOTAL) BY MOUTH DAILY., Disp: 90 tablet, Rfl: 0 .  clopidogrel (PLAVIX) 75 MG tablet, TAKE 1 TABLET BY MOUTH EVERY DAY, Disp: 90 tablet, Rfl: 2 .  colchicine 0.6 MG tablet, Take 0.6 mg by mouth daily as needed. For gout flares., Disp: , Rfl: 3 .  hydrochlorothiazide (MICROZIDE) 12.5 MG capsule,  TAKE 1 CAPSULE (12.5 MG TOTAL) BY MOUTH DAILY., Disp: 90 capsule, Rfl: 1 .  indomethacin (INDOCIN) 50 MG capsule, Take 50 mg by mouth daily as needed. For gout pain., Disp: , Rfl: 3 .  metoprolol tartrate (LOPRESSOR) 25 MG tablet, TAKE 1 TABLET BY MOUTH TWICE A DAY, Disp: 180 tablet, Rfl: 0 .  nitroGLYCERIN (NITROSTAT) 0.4 MG SL tablet, Place 1 tablet (0.4 mg total) under the tongue every 5 (five) minutes as needed for chest pain., Disp: 25 tablet, Rfl: 2 .  potassium chloride SA (K-DUR,KLOR-CON) 20 MEQ tablet, Take 1 tablet (20 mEq total) by mouth daily., Disp: 90 tablet, Rfl: 0 .  HYDROcodone-acetaminophen (NORCO/VICODIN) 5-325 MG tablet, Take 1-2 tablets by mouth every 6 (six) hours as needed. (Patient not taking:  Reported on 05/06/2016), Disp: 14 tablet, Rfl: 0 .  ondansetron (ZOFRAN ODT) 4 MG disintegrating tablet, Take 1 tablet (4 mg total) by mouth every 8 (eight) hours as needed. (Patient not taking: Reported on 05/06/2016), Disp: 10 tablet, Rfl: 1 .  predniSONE (DELTASONE) 10 MG tablet, Take 4 tablets (40 mg total) by mouth daily. (Patient not taking: Reported on 05/06/2016), Disp: 20 tablet, Rfl: 0         Physical Exam Physical Exam BP 128/68   Pulse (!) 56   Ht 5\' 9"  (1.753 m)   Wt 250 lb (113.4 kg)   BMI 36.92 kg/m    Gen. appearance. The patient is well-developed and well-nourished, grooming and hygiene are normal. There are no gross congenital abnormalities   The patient is alert and oriented to person place and time   Mood and affect are normal   Ambulation Normal   Examination reveals the following: On inspection we find right elbow, Large firm mass posterior aspect of right elbow nontender    With the range of motion of  135   Stability tests were normal   Strength tests revealed grade 5 motor strength   Skin we find no rash ulceration or erythema   Sensation remains intact   Impression vascular system shows no peripheral edema   Data Reviewed Plain films of the right elbow AP and lateral   Assessment        Encounter Diagnoses  Name Primary?  . Right elbow pain Yes  . Idiopathic gout of right elbow, unspecified chronicity          Plan    I will Have spoken with Dr. [name] regarding management of Plavix in the setting of a stent  He does conclude that he can come off of the Plavix for the surgery  Plan is for removal of mass right elbow  Diagnosis right elbow mass most likely from gout

## 2016-05-14 ENCOUNTER — Ambulatory Visit (HOSPITAL_COMMUNITY): Payer: Commercial Managed Care - HMO | Admitting: Anesthesiology

## 2016-05-14 ENCOUNTER — Ambulatory Visit (HOSPITAL_COMMUNITY)
Admission: RE | Admit: 2016-05-14 | Discharge: 2016-05-14 | Disposition: A | Discharge status: Home / Self Care | Payer: Commercial Managed Care - HMO | Source: Ambulatory Visit | Attending: Orthopedic Surgery | Admitting: Orthopedic Surgery

## 2016-05-14 ENCOUNTER — Encounter (HOSPITAL_COMMUNITY)
Admission: RE | Disposition: A | Discharge status: Home / Self Care | Payer: Self-pay | Source: Ambulatory Visit | Attending: Orthopedic Surgery

## 2016-05-14 ENCOUNTER — Encounter (HOSPITAL_COMMUNITY): Payer: Self-pay | Admitting: Anesthesiology

## 2016-05-14 DIAGNOSIS — Z9889 Other specified postprocedural states: Secondary | ICD-10-CM | POA: Diagnosis not present

## 2016-05-14 DIAGNOSIS — I10 Essential (primary) hypertension: Secondary | ICD-10-CM | POA: Diagnosis not present

## 2016-05-14 DIAGNOSIS — Z7982 Long term (current) use of aspirin: Secondary | ICD-10-CM | POA: Diagnosis not present

## 2016-05-14 DIAGNOSIS — Z79899 Other long term (current) drug therapy: Secondary | ICD-10-CM | POA: Diagnosis not present

## 2016-05-14 DIAGNOSIS — E785 Hyperlipidemia, unspecified: Secondary | ICD-10-CM | POA: Diagnosis not present

## 2016-05-14 DIAGNOSIS — Z9842 Cataract extraction status, left eye: Secondary | ICD-10-CM | POA: Insufficient documentation

## 2016-05-14 DIAGNOSIS — Z886 Allergy status to analgesic agent status: Secondary | ICD-10-CM | POA: Diagnosis not present

## 2016-05-14 DIAGNOSIS — Z951 Presence of aortocoronary bypass graft: Secondary | ICD-10-CM | POA: Diagnosis not present

## 2016-05-14 DIAGNOSIS — J45909 Unspecified asthma, uncomplicated: Secondary | ICD-10-CM | POA: Diagnosis not present

## 2016-05-14 DIAGNOSIS — E669 Obesity, unspecified: Secondary | ICD-10-CM | POA: Insufficient documentation

## 2016-05-14 DIAGNOSIS — Z961 Presence of intraocular lens: Secondary | ICD-10-CM | POA: Diagnosis not present

## 2016-05-14 DIAGNOSIS — Z9841 Cataract extraction status, right eye: Secondary | ICD-10-CM | POA: Insufficient documentation

## 2016-05-14 DIAGNOSIS — K219 Gastro-esophageal reflux disease without esophagitis: Secondary | ICD-10-CM | POA: Insufficient documentation

## 2016-05-14 DIAGNOSIS — I252 Old myocardial infarction: Secondary | ICD-10-CM | POA: Diagnosis not present

## 2016-05-14 DIAGNOSIS — M1A0211 Idiopathic chronic gout, right elbow, with tophus (tophi): Secondary | ICD-10-CM

## 2016-05-14 DIAGNOSIS — I2510 Atherosclerotic heart disease of native coronary artery without angina pectoris: Secondary | ICD-10-CM | POA: Diagnosis not present

## 2016-05-14 DIAGNOSIS — R2231 Localized swelling, mass and lump, right upper limb: Secondary | ICD-10-CM | POA: Diagnosis present

## 2016-05-14 HISTORY — PX: MASS EXCISION: SHX2000

## 2016-05-14 SURGERY — EXCISION MASS
Anesthesia: General | Site: Elbow | Laterality: Right

## 2016-05-14 MED ORDER — BUPIVACAINE-EPINEPHRINE (PF) 0.5% -1:200000 IJ SOLN
INTRAMUSCULAR | Status: DC | PRN
  Administered 2016-05-14: 30 mL via PERINEURAL

## 2016-05-14 MED ORDER — HYDROMORPHONE HCL 2 MG PO TABS: 2.0000 mg | ORAL_TABLET | ORAL | 0 refills | Status: DC | PRN

## 2016-05-14 MED ORDER — CEFAZOLIN SODIUM-DEXTROSE 2-4 GM/100ML-% IV SOLN
2.0000 g | INTRAVENOUS | Status: AC
  Administered 2016-05-14: 2 g via INTRAVENOUS
  Filled 2016-05-14: qty 100

## 2016-05-14 MED ORDER — MIDAZOLAM HCL 2 MG/2ML IJ SOLN
INTRAMUSCULAR | Status: AC
  Filled 2016-05-14: qty 2

## 2016-05-14 MED ORDER — FENTANYL CITRATE (PF) 100 MCG/2ML IJ SOLN
INTRAMUSCULAR | Status: AC
  Filled 2016-05-14: qty 2

## 2016-05-14 MED ORDER — FENTANYL CITRATE (PF) 100 MCG/2ML IJ SOLN
25.0000 ug | INTRAMUSCULAR | Status: AC
  Administered 2016-05-14: 25 ug via INTRAVENOUS

## 2016-05-14 MED ORDER — SODIUM CHLORIDE 0.9 % IR SOLN
Status: DC | PRN
  Administered 2016-05-14: 400 mL

## 2016-05-14 MED ORDER — PROPOFOL 10 MG/ML IV BOLUS
INTRAVENOUS | Status: DC | PRN
  Administered 2016-05-14: 120 mg via INTRAVENOUS

## 2016-05-14 MED ORDER — LACTATED RINGERS IV SOLN
INTRAVENOUS | Status: DC
  Administered 2016-05-14: 09:00:00 via INTRAVENOUS

## 2016-05-14 MED ORDER — HYDROMORPHONE HCL 1 MG/ML IJ SOLN: 0.2500 mg | INTRAMUSCULAR | Status: DC | PRN

## 2016-05-14 MED ORDER — LIDOCAINE HCL (CARDIAC) 20 MG/ML IV SOLN
INTRAVENOUS | Status: DC | PRN
  Administered 2016-05-14: 30 mg via INTRAVENOUS

## 2016-05-14 MED ORDER — FENTANYL CITRATE (PF) 100 MCG/2ML IJ SOLN
INTRAMUSCULAR | Status: DC | PRN
  Administered 2016-05-14 (×2): 25 ug via INTRAVENOUS
  Administered 2016-05-14: 50 ug via INTRAVENOUS

## 2016-05-14 MED ORDER — MIDAZOLAM HCL 2 MG/2ML IJ SOLN
1.0000 mg | INTRAMUSCULAR | Status: AC
Start: 2016-05-14 — End: 2016-05-14
  Administered 2016-05-14 (×2): 2 mg via INTRAVENOUS
  Filled 2016-05-14: qty 2

## 2016-05-14 MED ORDER — LIDOCAINE HCL (PF) 1 % IJ SOLN
INTRAMUSCULAR | Status: AC
  Filled 2016-05-14: qty 5

## 2016-05-14 MED ORDER — BUPIVACAINE-EPINEPHRINE (PF) 0.5% -1:200000 IJ SOLN
INTRAMUSCULAR | Status: AC
  Filled 2016-05-14: qty 60

## 2016-05-14 MED ORDER — CHLORHEXIDINE GLUCONATE 4 % EX LIQD: 60.0000 mL | Freq: Once | CUTANEOUS | Status: DC

## 2016-05-14 MED ORDER — PROPOFOL 10 MG/ML IV BOLUS
INTRAVENOUS | Status: AC
  Filled 2016-05-14: qty 20

## 2016-05-14 SURGICAL SUPPLY — 51 items
BAG HAMPER (MISCELLANEOUS) ×2 IMPLANT
BANDAGE ELASTIC 3 LF NS (GAUZE/BANDAGES/DRESSINGS) ×1 IMPLANT
BANDAGE ELASTIC 4 LF NS (GAUZE/BANDAGES/DRESSINGS) ×1 IMPLANT
BANDAGE ESMARK 4X12 BL STRL LF (DISPOSABLE) IMPLANT
BLADE SURG 15 STRL LF DISP TIS (BLADE) IMPLANT
BLADE SURG 15 STRL SS (BLADE) ×2
BNDG CMPR 12X4 ELC STRL LF (DISPOSABLE) ×1
BNDG CMPR MED 5X3 ELC HKLP NS (GAUZE/BANDAGES/DRESSINGS) ×1
BNDG CMPR MED 5X4 ELC HKLP NS (GAUZE/BANDAGES/DRESSINGS) ×1
BNDG COHESIVE 4X5 TAN STRL (GAUZE/BANDAGES/DRESSINGS) ×2 IMPLANT
BNDG ESMARK 4X12 BLUE STRL LF (DISPOSABLE) ×2
BNDG GAUZE ELAST 4 BULKY (GAUZE/BANDAGES/DRESSINGS) ×2 IMPLANT
CHLORAPREP W/TINT 26ML (MISCELLANEOUS) ×2 IMPLANT
CLOSURE STERI STRIP 1/2 X4 (GAUZE/BANDAGES/DRESSINGS) ×1 IMPLANT
CLOTH BEACON ORANGE TIMEOUT ST (SAFETY) ×2 IMPLANT
COVER LIGHT HANDLE STERIS (MISCELLANEOUS) ×6 IMPLANT
CUFF TOURNIQUET SINGLE 18IN (TOURNIQUET CUFF) ×2 IMPLANT
DECANTER SPIKE VIAL GLASS SM (MISCELLANEOUS) ×2 IMPLANT
DRAIN TROCAR  MED 1/8 (DRAIN) ×1 IMPLANT
DRSG XEROFORM 1X8 (GAUZE/BANDAGES/DRESSINGS) ×2 IMPLANT
ELECT NDL TIP 2.8 STRL (NEEDLE) ×1 IMPLANT
ELECT NEEDLE TIP 2.8 STRL (NEEDLE) ×2 IMPLANT
ELECT REM PT RETURN 9FT ADLT (ELECTROSURGICAL) ×2
ELECTRODE REM PT RTRN 9FT ADLT (ELECTROSURGICAL) ×1 IMPLANT
EVACUATOR 3/16  PVC DRAIN (DRAIN)
EVACUATOR 3/16 PVC DRAIN (DRAIN) IMPLANT
GAUZE KERLIX 2  STERILE LF (GAUZE/BANDAGES/DRESSINGS) ×1 IMPLANT
GAUZE SPONGE 4X4 12PLY STRL (GAUZE/BANDAGES/DRESSINGS) ×2 IMPLANT
GAUZE SPONGE 4X4 16PLY XRAY LF (GAUZE/BANDAGES/DRESSINGS) ×1 IMPLANT
GLOVE BIOGEL PI IND STRL 7.0 (GLOVE) ×1 IMPLANT
GLOVE BIOGEL PI INDICATOR 7.0 (GLOVE) ×3
GLOVE SKINSENSE NS SZ8.0 LF (GLOVE) ×2
GLOVE SKINSENSE STRL SZ8.0 LF (GLOVE) ×1 IMPLANT
GLOVE SS N UNI LF 8.5 STRL (GLOVE) ×3 IMPLANT
GOWN STRL REUS W/TWL LRG LVL3 (GOWN DISPOSABLE) ×6 IMPLANT
GOWN STRL REUS W/TWL XL LVL3 (GOWN DISPOSABLE) ×2 IMPLANT
INST SET MINOR BONE (KITS) ×1 IMPLANT
KIT ROOM TURNOVER APOR (KITS) ×2 IMPLANT
MANIFOLD NEPTUNE II (INSTRUMENTS) ×2 IMPLANT
NDL HYPO 21X1.5 SAFETY (NEEDLE) ×1 IMPLANT
NEEDLE HYPO 21X1.5 SAFETY (NEEDLE) ×2 IMPLANT
NS IRRIG 1000ML POUR BTL (IV SOLUTION) ×2 IMPLANT
PACK BASIC LIMB (CUSTOM PROCEDURE TRAY) ×2 IMPLANT
PAD ARMBOARD 7.5X6 YLW CONV (MISCELLANEOUS) ×2 IMPLANT
SET BASIN LINEN APH (SET/KITS/TRAYS/PACK) ×2 IMPLANT
STAPLER VISISTAT 35W (STAPLE) ×1 IMPLANT
STRIP CLOSURE SKIN 1/2X4 (GAUZE/BANDAGES/DRESSINGS) ×2 IMPLANT
SUT ETHILON 3 0 FSL (SUTURE) ×2 IMPLANT
SUT MON AB 2-0 SH 27 (SUTURE) ×4
SUT MON AB 2-0 SH27 (SUTURE) ×1 IMPLANT
SYRINGE 10CC LL (SYRINGE) ×2 IMPLANT

## 2016-05-14 NOTE — Op Note (Signed)
05/14/2016  11:23 AM  PATIENT:  Rodena Medin  61 y.o. 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 He was then taken to surgery. We gave him Ancef as a preoperative antibiotic. He had general anesthesia and he was in the supine position with his arm across his chest and abdomen.  He had sterile prep and drape timeout was taken to confirm the surgical site  The surgical team was in agreement and we proceeded by elevating the limb and exsanguinating it with a 4 inch Esmarch and the tourniquet increased to 250 mmHg  I made a midline incision. Proximal and distal to the lesion I made the incision and extended it to normal tissue. I then found the plane between the skin and the subcutaneous tissue and carried out across the mass and excised it with sharp dissection. We did make one rent in the skin which I used to carry the drain through and sewed it back up.  I used a rongeur to remove some of the bone from the olecranon. Although the x-ray which I referred to an drop did not show a spur there was prominent bone at the end of the olecranon   After thorough irrigation I used 2-0 Monocryl suture to close the subcutaneous tissue and staples to close the skin I did sew the drain in with a 3-0 nylon suture as well    SURGEON:  Surgeon(s) and Role:    * Carole Civil, MD - Primary  PHYSICIAN ASSISTANT:   ASSISTANTS: betty ashley   ANESTHESIA:   general  EBL:  Total I/O In: 700 [I.V.:700] Out: 5 [Blood:5]  BLOOD ADMINISTERED:none  DRAINS: hemovac drain    LOCAL MEDICATIONS USED:  MARCAINE    and Amount: 30 ml  SPECIMEN:   Source of Specimen:  rt elbow   DISPOSITION OF SPECIMEN:  PATHOLOGY  COUNTS:  YES  TOURNIQUET:   Total Tourniquet Time Documented: Upper Arm (Right) - 40 minutes Total: Upper Arm (Right) - 40 minutes   DICTATION: .Viviann Spare Dictation  PLAN OF CARE: Discharge to home after PACU  PATIENT DISPOSITION:  PACU - hemodynamically stable.   Delay start of Pharmacological VTE agent (>24hrs) due to surgical blood loss or risk of bleeding: not applicable   [id number]

## 2016-05-14 NOTE — Brief Op Note (Signed)
05/14/2016  11:23 AM  PATIENT:  Rodena Medin  61 y.o. 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 He was then taken to surgery. We gave him Ancef as a preoperative antibiotic. He had general anesthesia and he was in the supine position with his arm across his chest and abdomen.  He had sterile prep and drape timeout was taken to confirm the surgical site  The surgical team was in agreement and we proceeded by elevating the limb and exsanguinating it with a 4 inch Esmarch and the tourniquet increased to 250 mmHg  I made a midline incision. Proximal and distal to the lesion I made the incision and extended it to normal tissue. I then found the plane between the skin and the subcutaneous tissue and carried out across the mass and excised it with sharp dissection. We did make one rent in the skin which I used to carry the drain through and sewed it back up.  I used a rongeur to remove some of the bone from the olecranon. Although the x-ray which I referred to an drop did not show a spur there was prominent bone at the end of the olecranon   After thorough irrigation I used 2-0 Monocryl suture to close the subcutaneous tissue and staples to close the skin I did sew the drain in with a 3-0 nylon suture as well    SURGEON:  Surgeon(s) and Role:    * Carole Civil, MD - Primary  PHYSICIAN ASSISTANT:   ASSISTANTS: betty ashley   ANESTHESIA:   general  EBL:  Total I/O In: 700 [I.V.:700] Out: 5 [Blood:5]  BLOOD ADMINISTERED:none  DRAINS: hemovac drain    LOCAL MEDICATIONS USED:  MARCAINE    and Amount: 30 ml  SPECIMEN:   Source of Specimen:  rt elbow   DISPOSITION OF SPECIMEN:  PATHOLOGY  COUNTS:  YES  TOURNIQUET:   Total Tourniquet Time Documented: Upper Arm (Right) - 40 minutes Total: Upper Arm (Right) - 40 minutes   DICTATION: .Viviann Spare Dictation  PLAN OF CARE: Discharge to home after PACU  PATIENT DISPOSITION:  PACU - hemodynamically stable.   Delay start of Pharmacological VTE agent (>24hrs) due to surgical blood loss or risk of bleeding: not applicable   [id number]

## 2016-05-14 NOTE — Interval H&P Note (Signed)
History and Physical Interval Note:  05/14/2016 8:54 AM  Temp 97.6 F (36.4 C) (Oral)   Skin ok for surgrery [name]  has presented today for surgery, with the diagnosis of RIGHT ELBOW MASS  The various methods of treatment have been discussed with the patient and family. After consideration of risks, benefits and other options for treatment, the patient has consented to  Procedure(s) with comments: EXCISION MASS RIGHT ELBOW (Right) - patient knows to arrive at 8:45 as a surgical intervention .  The patient's history has been reviewed, patient examined, no change in status, stable for surgery.  I have reviewed the patient's chart and labs.  Questions were answered to the patient's satisfaction.     [name]

## 2016-05-14 NOTE — Anesthesia Procedure Notes (Signed)
Procedure Name: LMA Insertion Date/Time: 05/14/2016 10:07 AM Performed by: Andree Elk, AMY A Pre-anesthesia Checklist: Patient identified, Timeout performed, Emergency Drugs available, Suction available and Patient being monitored Patient Re-evaluated:Patient Re-evaluated prior to inductionOxygen Delivery Method: Circle system utilized Preoxygenation: Pre-oxygenation with 100% oxygen Intubation Type: IV induction Ventilation: Mask ventilation with difficulty LMA: LMA inserted LMA Size: 5.0 Number of attempts: 1 Tube secured with: Tape Dental Injury: Teeth and Oropharynx as per pre-operative assessment

## 2016-05-14 NOTE — Anesthesia Postprocedure Evaluation (Signed)
Anesthesia Post Note  Patient: [name]  Procedure(s) Performed: Procedure(s) (LRB): EXCISION MASS RIGHT ELBOW (Right)  Patient location during evaluation: PACU Anesthesia Type: General Level of consciousness: awake and alert and oriented Pain management: pain level controlled Vital Signs Assessment: post-procedure vital signs reviewed and stable Respiratory status: spontaneous breathing Cardiovascular status: stable Postop Assessment: no signs of nausea or vomiting Anesthetic complications: no     Last Vitals:  Vitals:   05/14/16 0945 05/14/16 1119  BP: (!) 117/59 133/74  Pulse:  (!) 58  Resp: 13 14  Temp:  37.1 C    Last Pain:  Vitals:   05/14/16 0847  TempSrc: Oral                 ADAMS, AMY A

## 2016-05-14 NOTE — Transfer of Care (Signed)
Immediate Anesthesia Transfer of Care Note  Patient: [name]  Procedure(s) Performed: Procedure(s) with comments: EXCISION MASS RIGHT ELBOW (Right) - patient knows to arrive at 8:45  Patient Location: PACU  Anesthesia Type:General  Level of Consciousness: awake, oriented and patient cooperative  Airway & Oxygen Therapy: Patient Spontanous Breathing and Patient connected to face mask oxygen  Post-op Assessment: Report given to RN and Post -op Vital signs reviewed and stable  Post vital signs: Reviewed and stable  Last Vitals:  Vitals:   05/14/16 0935 05/14/16 0940  BP: (!) 111/52 (!) 105/55  Resp: 17 14  Temp:      Last Pain:  Vitals:   05/14/16 0847  TempSrc: Oral         Complications: No apparent anesthesia complications

## 2016-05-14 NOTE — Anesthesia Preprocedure Evaluation (Signed)
Anesthesia Evaluation  Patient identified by MRN, date of birth, ID band Patient awake    Reviewed: Allergy & Precautions, H&P , NPO status , Patient's Chart, lab work & pertinent test results, reviewed documented beta blocker date and time   Airway Mallampati: I  TM Distance: >3 FB Neck ROM: Full    Dental  (+) Teeth Intact   Pulmonary neg pulmonary ROS,    breath sounds clear to auscultation       Cardiovascular hypertension, Pt. on medications (-) angina+ CAD, + Past MI and + Cardiac Stents   Rhythm:Regular Rate:Normal     Neuro/Psych    GI/Hepatic GERD  Controlled,  Endo/Other    Renal/GU      Musculoskeletal   Abdominal   Peds  Hematology   Anesthesia Other Findings   Reproductive/Obstetrics                             Anesthesia Physical Anesthesia Plan  ASA: III  Anesthesia Plan: General   Post-op Pain Management:    Induction: Intravenous  Airway Management Planned: LMA  Additional Equipment:   Intra-op Plan:   Post-operative Plan: Extubation in OR  Informed Consent: I have reviewed the patients History and Physical, chart, labs and discussed the procedure including the risks, benefits and alternatives for the proposed anesthesia with the patient or authorized representative who has indicated his/her understanding and acceptance.     Plan Discussed with:   Anesthesia Plan Comments:         Anesthesia Quick Evaluation

## 2016-05-15 ENCOUNTER — Encounter (HOSPITAL_COMMUNITY): Payer: Self-pay | Admitting: Orthopedic Surgery

## 2016-05-19 ENCOUNTER — Ambulatory Visit (INDEPENDENT_AMBULATORY_CARE_PROVIDER_SITE_OTHER): Payer: Commercial Managed Care - HMO | Admitting: Orthopedic Surgery

## 2016-05-19 ENCOUNTER — Encounter: Payer: Self-pay | Admitting: Orthopedic Surgery

## 2016-05-19 DIAGNOSIS — M10021 Idiopathic gout, right elbow: Secondary | ICD-10-CM

## 2016-05-19 DIAGNOSIS — Z4889 Encounter for other specified surgical aftercare: Secondary | ICD-10-CM

## 2016-05-19 DIAGNOSIS — M25521 Pain in right elbow: Secondary | ICD-10-CM

## 2016-05-19 NOTE — Progress Notes (Signed)
Postop visit #1 status post excision of mass right elbow.  Report came back gout tophus  Chief Complaint  Patient presents with  . Follow-up    Post op 1, right elbow mass excision, DOS 2/21/180    Postop day #5. I did remove the drain today the wound looks great no fluid collection  Rewrapped with gauze Kling gauze and Ace bandage    follow-up for right elbow suture and staple removal

## 2016-05-22 ENCOUNTER — Other Ambulatory Visit: Payer: Commercial Managed Care - HMO | Admitting: *Deleted

## 2016-05-22 DIAGNOSIS — I10 Essential (primary) hypertension: Secondary | ICD-10-CM

## 2016-05-22 DIAGNOSIS — E7800 Pure hypercholesterolemia, unspecified: Secondary | ICD-10-CM | POA: Diagnosis not present

## 2016-05-22 LAB — CBC WITH DIFFERENTIAL/PLATELET
Basophils Absolute: 0 10*3/uL (ref 0.0–0.2)
Basos: 0 %
EOS (ABSOLUTE): 0.1 10*3/uL (ref 0.0–0.4)
Eos: 1 %
Hematocrit: 38.6 % (ref 37.5–51.0)
Hemoglobin: 13.5 g/dL (ref 13.0–17.7)
IMMATURE GRANULOCYTES: 1 %
Immature Grans (Abs): 0.1 10*3/uL (ref 0.0–0.1)
Lymphocytes Absolute: 2.4 10*3/uL (ref 0.7–3.1)
Lymphs: 24 %
MCH: 30.5 pg (ref 26.6–33.0)
MCHC: 35 g/dL (ref 31.5–35.7)
MCV: 87 fL (ref 79–97)
MONOS ABS: 1 10*3/uL — AB (ref 0.1–0.9)
Monocytes: 10 %
NEUTROS PCT: 64 %
Neutrophils Absolute: 6.4 10*3/uL (ref 1.4–7.0)
PLATELETS: 302 10*3/uL (ref 150–379)
RBC: 4.42 x10E6/uL (ref 4.14–5.80)
RDW: 12.8 % (ref 12.3–15.4)
WBC: 10 10*3/uL (ref 3.4–10.8)

## 2016-05-22 LAB — COMPREHENSIVE METABOLIC PANEL
A/G RATIO: 2 (ref 1.2–2.2)
ALK PHOS: 99 IU/L (ref 39–117)
ALT: 36 IU/L (ref 0–44)
AST: 30 IU/L (ref 0–40)
Albumin: 4.7 g/dL (ref 3.6–4.8)
BUN/Creatinine Ratio: 12 (ref 10–24)
BUN: 12 mg/dL (ref 8–27)
Bilirubin Total: 0.7 mg/dL (ref 0.0–1.2)
CO2: 23 mmol/L (ref 18–29)
Calcium: 9.4 mg/dL (ref 8.6–10.2)
Chloride: 98 mmol/L (ref 96–106)
Creatinine, Ser: 0.98 mg/dL (ref 0.76–1.27)
GFR calc Af Amer: 96 mL/min/{1.73_m2} (ref >59)
GFR calc non Af Amer: 83 mL/min/{1.73_m2} (ref >59)
GLOBULIN, TOTAL: 2.4 g/dL (ref 1.5–4.5)
Glucose: 106 mg/dL — ABNORMAL HIGH (ref 65–99)
POTASSIUM: 4.5 mmol/L (ref 3.5–5.2)
SODIUM: 140 mmol/L (ref 134–144)
Total Protein: 7.1 g/dL (ref 6.0–8.5)

## 2016-05-22 LAB — LIPID PANEL
CHOLESTEROL TOTAL: 190 mg/dL (ref 100–199)
Chol/HDL Ratio: 3.8 ratio units (ref 0.0–5.0)
HDL: 50 mg/dL (ref >39)
LDL CALC: 106 mg/dL — AB (ref 0–99)
TRIGLYCERIDES: 170 mg/dL — AB (ref 0–149)
VLDL Cholesterol Cal: 34 mg/dL (ref 5–40)

## 2016-05-22 NOTE — Addendum Note (Signed)
Addended by: Eulis Foster on: 05/22/2016 08:36 AM   Modules accepted: Orders

## 2016-05-27 ENCOUNTER — Ambulatory Visit (INDEPENDENT_AMBULATORY_CARE_PROVIDER_SITE_OTHER): Payer: Commercial Managed Care - HMO | Admitting: Orthopedic Surgery

## 2016-05-27 DIAGNOSIS — Z4889 Encounter for other specified surgical aftercare: Secondary | ICD-10-CM

## 2016-05-27 NOTE — Progress Notes (Signed)
Patient in today for staple and suture removal Right elbow s/p mass excision 05/14/16. Wound dry and intact. No signs or symptoms of infection. Wound cleaned with alcohol and sutures and staples removed. Covered with steri strips. Patient tolerated well. 1 month follow up scheduled.

## 2016-05-30 ENCOUNTER — Encounter: Payer: Self-pay | Admitting: *Deleted

## 2016-06-06 ENCOUNTER — Other Ambulatory Visit: Payer: Self-pay | Admitting: Physician Assistant

## 2016-06-06 ENCOUNTER — Ambulatory Visit (INDEPENDENT_AMBULATORY_CARE_PROVIDER_SITE_OTHER): Payer: Commercial Managed Care - HMO | Admitting: Cardiovascular Disease

## 2016-06-06 ENCOUNTER — Encounter: Payer: Self-pay | Admitting: Cardiovascular Disease

## 2016-06-06 VITALS — BP 120/70 | HR 60 | Ht 69.0 in | Wt 257.4 lb

## 2016-06-06 DIAGNOSIS — I10 Essential (primary) hypertension: Secondary | ICD-10-CM

## 2016-06-06 DIAGNOSIS — I25810 Atherosclerosis of coronary artery bypass graft(s) without angina pectoris: Secondary | ICD-10-CM

## 2016-06-06 DIAGNOSIS — E785 Hyperlipidemia, unspecified: Secondary | ICD-10-CM

## 2016-06-06 MED ORDER — HYDROCHLOROTHIAZIDE 12.5 MG PO CAPS: 12.5000 mg | ORAL_CAPSULE | Freq: Every day | ORAL | 3 refills | Status: DC

## 2016-06-06 MED ORDER — METOPROLOL TARTRATE 25 MG PO TABS: ORAL_TABLET | ORAL | 3 refills | Status: DC

## 2016-06-06 MED ORDER — EZETIMIBE 10 MG PO TABS: 10.0000 mg | ORAL_TABLET | Freq: Every day | ORAL | 3 refills | Status: DC

## 2016-06-06 MED ORDER — ATORVASTATIN CALCIUM 80 MG PO TABS: 80.0000 mg | ORAL_TABLET | Freq: Every day | ORAL | 3 refills | Status: DC

## 2016-06-06 MED ORDER — CLOPIDOGREL BISULFATE 75 MG PO TABS: 75.0000 mg | ORAL_TABLET | Freq: Every day | ORAL | 3 refills | Status: DC

## 2016-06-06 NOTE — Progress Notes (Signed)
Cardiology Office Note Date:  06/06/2016   ID:  [name], DOB 1955-04-23, MRN [id number]  PCP:  [place]  Cardiologist:  [name], MD    Chief Complaint  Patient presents with  . essential hypertension    follow up     History of Present Illness: [name] is a 61 y.o. male who presents for follow-up of CAD s/p CABG in 2006, HTN, HL, asthma, GERD. He suffered a NSTEMI in 05/2013 and LHC demonstrated severe disease in S-PDA and S-Dx. The L-LAD and free RIMA-OM were patent. He underwent PCI to both SVGs with a DES. EF was preserved.  The patient is doing well from a symptomatic perspective. He denies chest pain, shortness of breath, edema, or heart palpitations. He had surgery on his right elbow recently and had no problems related to that. He held his Plavix prior to surgery but is taking it again now. He retired a year ago and has gained a lot of weight since retirement. He has not been as physically active.   Past Medical History:  Diagnosis Date  . Arthritis   . CAD (coronary artery disease) 2000   a. History of MI ~2000 s/p PCI. b. CABG in 2006 (LIMA-LAD, RIMA-acute marginal, SVG-DX, SVG-RCA). c. NSTEMI 05/2013:severe stenoses of the SVG-PDA and SVG-diagonal, both treated successfully with PCI (DES x2).  Marland Kitchen GERD (gastroesophageal reflux disease)   . Gout   . History of hypertension   . Hydrocele 2012  . Hyperlipidemia   . Hypertension   . Myocardial infarction 2014   x3  . Obesity     Past Surgical History:  Procedure Laterality Date  . CATARACT EXTRACTION W/PHACO Left 05/17/2012   Procedure: CATARACT EXTRACTION PHACO AND INTRAOCULAR LENS PLACEMENT (IOC);  Surgeon: Tonny Branch, MD;  Location: [facility];  Service: Ophthalmology;  Laterality: Left;  CDE:  0.76  . CATARACT EXTRACTION W/PHACO Right 08/26/2012   Procedure: CATARACT EXTRACTION PHACO AND INTRAOCULAR LENS PLACEMENT (IOC);  Surgeon: Tonny Branch, MD;  Location: [facility];   Service: Ophthalmology;  Laterality: Right;  CDE: 1.13  . CORONARY ANGIOPLASTY  2000   stents x2  . CORONARY ARTERY BYPASS GRAFT  2006   4 vessels  . Coronary artery bypass grafting  May 2006   Four-vessel  . FOOT FRACTURE SURGERY     right  . LEFT HEART CATHETERIZATION WITH CORONARY/GRAFT ANGIOGRAM N/A 05/05/2011   Procedure: LEFT HEART CATHETERIZATION WITH Beatrix Fetters;  Surgeon: Josue Hector, MD;  Location: [facility] CATH LAB;  Service: Cardiovascular;  Laterality: N/A;  . LEFT HEART CATHETERIZATION WITH CORONARY/GRAFT ANGIOGRAM N/A 06/06/2013   Procedure: LEFT HEART CATHETERIZATION WITH Beatrix Fetters;  Surgeon: Blane Ohara, MD;  Location: [facility] CATH LAB;  Service: Cardiovascular;  Laterality: N/A;  . MASS EXCISION Right 05/14/2016   Procedure: EXCISION MASS RIGHT ELBOW;  Surgeon: Carole Civil, MD;  Location: [facility];  Service: Orthopedics;  Laterality: Right;  . PILONIDAL CYST EXCISION    . TONSILLECTOMY      Current Outpatient Prescriptions  Medication Sig Dispense Refill  . aspirin EC 81 MG tablet Take 81 mg by mouth daily.    Marland Kitchen atorvastatin (LIPITOR) 80 MG tablet Take 1 tablet (80 mg total) by mouth daily. 90 tablet 3  . clopidogrel (PLAVIX) 75 MG tablet Take 1 tablet (75 mg total) by mouth daily. 90 tablet 3  . colchicine 0.6 MG tablet Take 0.6 mg by mouth daily as needed. For gout flares.  3  . hydrochlorothiazide (MICROZIDE) 12.5 MG capsule Take 1 capsule (12.5 mg total) by mouth daily. 90 capsule 3  . HYDROmorphone (DILAUDID) 2 MG tablet Take 1 tablet (2 mg total) by mouth every 4 (four) hours as needed for severe pain. 30 tablet 0  . indomethacin (INDOCIN) 50 MG capsule Take 50 mg by mouth daily as needed. For gout pain.  3  . metoprolol tartrate (LOPRESSOR) 25 MG tablet TAKE 1 TABLET BY MOUTH TWICE A DAY 180 tablet 3  . nitroGLYCERIN (NITROSTAT) 0.4 MG SL tablet Place 1 tablet (0.4 mg total) under the tongue every 5 (five) minutes as needed for chest  pain. 25 tablet 2  . potassium chloride SA (K-DUR,KLOR-CON) 20 MEQ tablet Take 1 tablet (20 mEq total) by mouth daily. 90 tablet 0  . ezetimibe (ZETIA) 10 MG tablet Take 1 tablet (10 mg total) by mouth daily. 90 tablet 3   No current facility-administered medications for this visit.     Allergies:   Hydrocodone   Social History:  The patient  reports that he has never smoked. He has never used smokeless tobacco. He reports that he does not drink alcohol or use drugs.   Family History:  The patient's  family history includes Cancer in his father and mother; Cervical cancer in his mother; Healthy in his daughter and son; Throat cancer in his father; Thyroid disease in his son.    ROS:  Please see the history of present illness.  All other systems are reviewed and negative.    PHYSICAL EXAM: VS:  BP 120/70   Pulse 60   Ht _0  (1.753 m)   Wt 257 lb 6.4 oz (116.8 kg)   BMI 38.01 kg/m  , BMI Body mass index is 38.01 kg/m. GEN: Well nourished, well developed, pleasant obese male in no acute distress  HEENT: normal  Neck: no JVD, no masses. No carotid bruits Cardiac: RRR without murmur or gallop                Respiratory:  clear to auscultation bilaterally, normal work of breathing GI: soft, nontender, nondistended, + BS, obese MS: no deformity or atrophy  Ext: no pretibial edema, pedal pulses 2+= bilaterally Skin: warm and dry, no rash Neuro:  Strength and sensation are intact Psych: euthymic mood, full affect  EKG:  EKG is not ordered today. The ekg from 05/09/2016 demonstrates sinus bradycardia with age indeterminate inferior MI  Recent Labs: 05/09/2016: Hemoglobin 13.8 05/22/2016: ALT 36; BUN 12; Creatinine, Ser 0.98; Platelets 302; Potassium 4.5; Sodium 140   Lipid Panel     Component Value Date/Time   CHOL 190 05/22/2016 0836   TRIG 170 (H) 05/22/2016 0836   HDL 50 05/22/2016 0836   CHOLHDL 3.8 05/22/2016 0836   CHOLHDL 3.4 05/07/2015 0834   VLDL 16 05/07/2015 0834     LDLCALC 106 (H) 05/22/2016 0836      Wt Readings from Last 3 Encounters:  06/06/16 257 lb 6.4 oz (116.8 kg)  05/09/16 257 lb (116.6 kg)  04/14/16 250 lb (113.4 kg)     Cardiac Studies Reviewed: Cardiac Cath 06-06-2013: PROCEDURAL FINDINGS Hemodynamics: AO 136/69 LV 141/21  Coronary angiography: Coronary dominance: right  Left mainstem: calcified without obstructive disease  Left anterior descending (LAD): diffuse proximal disease with 80% ostial stenosis and 100% occlusion at the first septal perforator  Left circumflex (LCx): hypodense eccentric 70% stenosis in the proximal circumflex. OM1 has 80% stenosis, OM2 with diffuse nonobstructive disease  Right  coronary artery (RCA): diffusely diseased in the proximal RCA. Total 100% occlusion in the mid-vessel.  LIMA-LAD: patent without disease. The LAD is diffusely diseased, especially in the apical portion with 80% apical stenosis  Free RIMA - OM: patent without obstructive disease  SVG - diagonal: 95% stenosis in mid-body of graft  SVG - PDA: 95% stenosis in proximal body of graft  Left ventriculography: Left ventricular systolic function is normal, LVEF is estimated at 55-65%, there is no significant mitral regurgitation   PCI Note: Following the diagnostic procedure, the decision was made to proceed with PCI. I planned on performing PCI on both vein graft lesions. The vein graft to the diagonal appeared too small for distal protection. The patient was loaded with effient 60 mg on the table. Weight-based bivalirudin was given for anticoagulation. Once a therapeutic ACT was achieved, a 6 [place] AL-1 guide catheter was inserted. IC verapamil was administered. A cougar coronary guidewire was used to cross the lesion. The lesion was predilated with a 2.0 mm balloon. The lesion was then stented with a 3.0x16 mm Promus DES stent. The stent was postdilated with a 3.25 mm noncompliant balloon to 14 atm.  Following PCI, there was 0% residual stenosis and TIMI-3 flow. Attention was then turned to the SVG-PDA. A MPA catheter was used. The same cougar wire was advanced across the lesion. A 3 mm Spider embolic protection device was prepped using normal technique. The device however would not traverse the aortic arch and it pushed the guide out of position on several attempts despite guide catheter manipulations. The device was removed and IC verapamil was again administered. The lesion was predilated with the same 2.0 mm balloon and a 2.5x24 mm Promus DES was used to treat the lesion. The stent was deployed at 12 atm and post-dilated with a 2.75 mm balloon to 16 atm. There was 0% residual stenosis and TIMI-3 flow at the completion of the procedure. Final angiography confirmed an excellent result. The patient tolerated the procedure well. There were no immediate procedural complications. A TR band was used for radial hemostasis. The patient was transferred to the post catheterization recovery area for further monitoring.  PCI Data: LESION 1 Vessel - diagonal 1/Segment - mid-body of SVG Percent Stenosis (pre) 95 TIMI-flow 3 Stent 3.0x16 mm Promus DES Percent Stenosis (post) 0 TIMI-flow (post) 3  LESION 2 Vessel - PDA/Segment - Proximal body of SVG Percent Stenosis (pre) 95 TIMI-flow 3 Stent 2.5x24 mm Promus DES Percent Stenosis (post) 0 TIMI-flow (post) 3  Final Conclusions:  1. Severe 3 vessel CAD with total occlusion of the LAD and RCA and severe stenosis of the LCx 2. S/P CABG with continued patency of the LIMA-LAD and free RIMA-OM 3. Severe stenoses of the SVG-PDA and SVG-diagonal, both treated successfully with PCI 4. Normal LV function   Recommendations:  ASA and effient at least 12 months. Anticipate d/c tomorrow if no complications.  ASSESSMENT AND PLAN: 1.  Coronary artery disease, native vessel and bypass graft disease, without symptoms of angina: Patient is doing well on his  current medical program which will be continued. Will treat him with long-term dual antiplatelet therapy as tolerated. He reports no bleeding or bruising problems at present.  2. Hyperlipidemia: Lipids are above goal on atorvastatin 80 mg. We had a lengthy discussion about lifestyle modification. Will add zetia 10 mg daily and repeat labs in 3 months.  3. Hypertension: Blood pressure is controlled with hydrochlorothiazide and metoprolol.  4. Obesity: Discussed strategies that  weight loss with specific discussion about carbohydrate restriction and daily exercise.  Current medicines are reviewed with the patient today.  The patient does not have concerns regarding medicines.  Labs/ tests ordered today include:   Orders Placed This Encounter  Procedures  . Lipid panel  . Hepatic function panel    Disposition:   FU one year  Signed, [name], MD  06/06/2016 5:52 PM    [facility] [place], [place], [place]  [address] Phone: [phone]; Fax: [phone]

## 2016-06-06 NOTE — Telephone Encounter (Signed)
Medication Detail    Disp Refills Start End   hydrochlorothiazide (MICROZIDE) 12.5 MG capsule 90 capsule 3 06/06/2016    Sig - Route: Take 1 capsule (12.5 mg total) by mouth daily. - Oral   E-Prescribing Status: Receipt confirmed by pharmacy (06/06/2016 8:59 AM EDT)   Pharmacy   [id number] - [place], [place] - [place]

## 2016-06-06 NOTE — Patient Instructions (Addendum)
Medication Instructions:  Your physician has recommended you make the following change in your medication:  1. START Zetia 10mg  take one tablet by mouth daily  Labwork: Your physician recommends that you return for a FASTING LIPID and LIVER in 3 MONTHS--nothing to eat or drink after midnight, lab opens at 7:30 AM (09/08/16)  Testing/Procedures: No new orders.   Follow-Up: Your physician wants you to follow-up in: 1 YEAR with Dr Burt Knack.  You will receive a reminder letter in the mail two months in advance. If you don't receive a letter, please call our office to schedule the follow-up appointment.   Any Other Special Instructions Will Be Listed Below (If Applicable).     If you need a refill on your cardiac medications before your next appointment, please call your pharmacy.

## 2016-06-24 ENCOUNTER — Ambulatory Visit (INDEPENDENT_AMBULATORY_CARE_PROVIDER_SITE_OTHER): Payer: Commercial Managed Care - HMO | Admitting: Orthopedic Surgery

## 2016-06-24 ENCOUNTER — Encounter: Payer: Self-pay | Admitting: Orthopedic Surgery

## 2016-06-24 DIAGNOSIS — Z4889 Encounter for other specified surgical aftercare: Secondary | ICD-10-CM

## 2016-06-24 NOTE — Progress Notes (Signed)
POST OP CHECK   Encounter Diagnosis  Name Primary?  Marland Kitchen Aftercare following surgery Yes    Chief Complaint  Patient presents with  . Follow-up    post op right elbow mass removal, DOS 05/14/16    C/O INTERMITTENT SENSITIVE WOUND   PE: THE WOUND LOOK CLEAN , HEALED  NORMAL ELBOW ROM NO ERYTHEMA IS SEEN  SHOULD RESOLVE   FU AS NEEDED

## 2016-09-04 ENCOUNTER — Other Ambulatory Visit: Payer: Self-pay | Admitting: Cardiovascular Disease

## 2016-09-05 ENCOUNTER — Other Ambulatory Visit: Payer: Self-pay | Admitting: *Deleted

## 2016-09-05 MED ORDER — POTASSIUM CHLORIDE CRYS ER 20 MEQ PO TBCR: 20.0000 meq | EXTENDED_RELEASE_TABLET | Freq: Every day | ORAL | 2 refills | Status: DC

## 2016-09-08 ENCOUNTER — Other Ambulatory Visit: Payer: 59 | Admitting: *Deleted

## 2016-09-08 DIAGNOSIS — E785 Hyperlipidemia, unspecified: Secondary | ICD-10-CM | POA: Diagnosis not present

## 2016-09-08 DIAGNOSIS — I10 Essential (primary) hypertension: Secondary | ICD-10-CM

## 2016-09-08 DIAGNOSIS — I25810 Atherosclerosis of coronary artery bypass graft(s) without angina pectoris: Secondary | ICD-10-CM

## 2016-09-08 LAB — LIPID PANEL
CHOLESTEROL TOTAL: 132 mg/dL (ref 100–199)
Chol/HDL Ratio: 2.9 ratio (ref 0.0–5.0)
HDL: 45 mg/dL (ref >39)
LDL Calculated: 62 mg/dL (ref 0–99)
Triglycerides: 124 mg/dL (ref 0–149)
VLDL Cholesterol Cal: 25 mg/dL (ref 5–40)

## 2016-09-08 LAB — HEPATIC FUNCTION PANEL
ALBUMIN: 4.6 g/dL (ref 3.6–4.8)
ALT: 28 IU/L (ref 0–44)
AST: 18 IU/L (ref 0–40)
Alkaline Phosphatase: 99 IU/L (ref 39–117)
BILIRUBIN TOTAL: 1 mg/dL (ref 0.0–1.2)
Bilirubin, Direct: 0.26 mg/dL (ref 0.00–0.40)
TOTAL PROTEIN: 6.8 g/dL (ref 6.0–8.5)

## 2016-09-09 ENCOUNTER — Encounter: Payer: Self-pay | Admitting: Cardiovascular Disease

## 2016-09-09 NOTE — Telephone Encounter (Signed)
New message   Pt states that he receieved a call and no message was left. Requests a return phone call and believes that it is regarding results.

## 2016-09-09 NOTE — Telephone Encounter (Signed)
This encounter was created in error - please disregard.

## 2016-09-23 DIAGNOSIS — M6208 Separation of muscle (nontraumatic), other site: Secondary | ICD-10-CM | POA: Diagnosis not present

## 2017-01-09 LAB — BASIC METABOLIC PANEL: GLUCOSE: 195

## 2017-01-21 ENCOUNTER — Telehealth: Payer: Self-pay

## 2017-01-21 NOTE — Telephone Encounter (Signed)
   [facility] Pre-operative Risk Assessment    Request for surgical clearance:  1. What type of surgery is being performed? Extraction of a tooth   2. When is this surgery scheduled? Not listed   3. Are there any medications that need to be held prior to surgery and how long? Plavix and ASA for 5 days prior to procedure   4. Practice name and name of physician performing surgery? Dr. Elenora Fender, DDS   5. What is your office phone and fax number?  1. Phone: [phone] 2. Fax: [phone]   6. Anesthesia type (None, local, MAC, general) ? Request states, "Potential intra-operative medications for oral sedation may include : Triazolam, Valium, and/or Hydroxyzine. Potential instra-operative local anesthetics administered may include: Lidocaine with Epinephrine, Marcaine with Epinephrine, Septocaine with Epinephrine and Nitrous Oxide. Potential post-operative medications include: Lortab, Penicillin, Phenergan, Peridex, Cleocin, Ibuprofen, and Tylenol."   [name] A 01/21/2017, 4:18 PM  _________________________________________________________________   (provider comments below)

## 2017-01-21 NOTE — Telephone Encounter (Signed)
I talked with Mr Zoll and he has not had chest pain or SOB and is able to do regular activities without problems.  It has been 3 years since last stent.  I will check with Dr. Burt Knack to hold asa and plavix for procedure. For 5 days.      Chart reviewed as part of pre-operative protocol coverage. Given past medical history and time since last visit, based on ACC/AHA guidelines, Choice L Hammerschmidt would be at acceptable risk for the planned procedure without further cardiovascular testing.   Cecilie Kicks, NP 01/21/2017, 4:54 PM

## 2017-01-22 NOTE — Telephone Encounter (Signed)
He can hold plavix if necessary. Should not hold aspirin for tooth extraction - cardiac risk exceeds bleeding risk. thanks

## 2017-01-22 NOTE — Telephone Encounter (Signed)
    Chart reviewed as part of pre-operative protocol coverage. Clearance decision has already been established by colleague below - per Cecilie Kicks and Dr. Burt Knack, patient is at acceptable risk for the planned procedure without further cardiovascular testing, and per Dr. Burt Knack "He can hold plavix if necessary. Should not hold aspirin for tooth extraction - cardiac risk exceeds bleeding risk."   Will fax this bundled note via [place] fax function to requesting party.  Charlie Pitter, PA-C  01/22/2017, 1:24 PM

## 2017-01-22 NOTE — Telephone Encounter (Signed)
Pt may hold Plavix for 5 days but cannot hold Asprin due to cardiac risk.

## 2017-03-06 ENCOUNTER — Other Ambulatory Visit: Payer: Self-pay | Admitting: Cardiovascular Disease

## 2017-03-06 DIAGNOSIS — I10 Essential (primary) hypertension: Secondary | ICD-10-CM

## 2017-03-06 DIAGNOSIS — I2510 Atherosclerotic heart disease of native coronary artery without angina pectoris: Secondary | ICD-10-CM

## 2017-03-06 DIAGNOSIS — E785 Hyperlipidemia, unspecified: Secondary | ICD-10-CM

## 2017-04-06 ENCOUNTER — Other Ambulatory Visit: Payer: Self-pay | Admitting: Cardiovascular Disease

## 2017-04-06 DIAGNOSIS — I2510 Atherosclerotic heart disease of native coronary artery without angina pectoris: Secondary | ICD-10-CM

## 2017-04-06 DIAGNOSIS — E785 Hyperlipidemia, unspecified: Secondary | ICD-10-CM

## 2017-04-06 DIAGNOSIS — I10 Essential (primary) hypertension: Secondary | ICD-10-CM

## 2017-06-17 ENCOUNTER — Other Ambulatory Visit: Payer: Self-pay | Admitting: Cardiovascular Disease

## 2017-06-23 ENCOUNTER — Other Ambulatory Visit: Payer: Self-pay | Admitting: Cardiovascular Disease

## 2017-07-19 ENCOUNTER — Other Ambulatory Visit: Payer: Self-pay | Admitting: Cardiovascular Disease

## 2017-07-22 ENCOUNTER — Other Ambulatory Visit: Payer: Self-pay | Admitting: Cardiovascular Disease

## 2017-07-23 NOTE — Telephone Encounter (Signed)
Outpatient Medication Detail    Disp Refills Start End   atorvastatin (LIPITOR) 80 MG tablet 90 tablet 0 07/21/2017    Sig: TAKE 1 TABLET BY MOUTH EVERY DAY   Sent to pharmacy as: atorvastatin (LIPITOR) 80 MG tablet   E-Prescribing Status: Receipt confirmed by pharmacy (07/21/2017 5:21 PM EDT)   Pharmacy   [id number] - [place], [place] - [place]

## 2017-07-27 ENCOUNTER — Other Ambulatory Visit: Payer: Self-pay | Admitting: Cardiovascular Disease

## 2017-08-19 ENCOUNTER — Other Ambulatory Visit: Payer: Self-pay | Admitting: Cardiovascular Disease

## 2017-08-31 ENCOUNTER — Other Ambulatory Visit: Payer: Self-pay | Admitting: Cardiovascular Disease

## 2017-08-31 NOTE — Telephone Encounter (Signed)
Outpatient Medication Detail    Disp Refills Start End   clopidogrel (PLAVIX) 75 MG tablet 90 tablet 0 08/19/2017    Sig - Route: Take 1 tablet (75 mg total) by mouth daily. Please keep upcoming appt for future refills. Thank you - Oral   Sent to pharmacy as: clopidogrel (PLAVIX) 75 MG tablet   E-Prescribing Status: Receipt confirmed by pharmacy (08/19/2017 2:42 PM EDT)   Pharmacy   [id number] - [place], [place]

## 2017-10-22 ENCOUNTER — Other Ambulatory Visit: Payer: Self-pay | Admitting: Cardiovascular Disease

## 2017-11-03 ENCOUNTER — Telehealth: Payer: Self-pay | Admitting: Cardiovascular Disease

## 2017-11-03 DIAGNOSIS — E785 Hyperlipidemia, unspecified: Secondary | ICD-10-CM

## 2017-11-03 DIAGNOSIS — I2510 Atherosclerotic heart disease of native coronary artery without angina pectoris: Secondary | ICD-10-CM

## 2017-11-03 NOTE — Telephone Encounter (Signed)
Called to schedule fasting labs prior to appointment Friday. Left message for patient to call and schedule fasting lab appointment tomorrow or Thursday.  Lipids, CMET, and CBC ordered for scheduling.

## 2017-11-03 NOTE — Telephone Encounter (Signed)
New message   Patient states that he would like to have labs setup before his appt w/Dr. Burt Knack on 11/06/2017.  Please advise.

## 2017-11-03 NOTE — Telephone Encounter (Signed)
Patient has called and scheduled lab appointment for tomorrow.   Orders linked.

## 2017-11-04 ENCOUNTER — Other Ambulatory Visit: Payer: 59 | Admitting: *Deleted

## 2017-11-04 DIAGNOSIS — E785 Hyperlipidemia, unspecified: Secondary | ICD-10-CM | POA: Diagnosis not present

## 2017-11-04 DIAGNOSIS — I2510 Atherosclerotic heart disease of native coronary artery without angina pectoris: Secondary | ICD-10-CM | POA: Diagnosis not present

## 2017-11-04 LAB — LIPID PANEL
Chol/HDL Ratio: 4.7 ratio (ref 0.0–5.0)
Cholesterol, Total: 188 mg/dL (ref 100–199)
HDL: 40 mg/dL (ref >39)
LDL Calculated: 118 mg/dL — ABNORMAL HIGH (ref 0–99)
Triglycerides: 151 mg/dL — ABNORMAL HIGH (ref 0–149)
VLDL Cholesterol Cal: 30 mg/dL (ref 5–40)

## 2017-11-04 LAB — CBC WITH DIFFERENTIAL/PLATELET
Basophils Absolute: 0 10*3/uL (ref 0.0–0.2)
Basos: 0 %
EOS (ABSOLUTE): 0.1 10*3/uL (ref 0.0–0.4)
Eos: 1 %
Hematocrit: 38.4 % (ref 37.5–51.0)
Hemoglobin: 13.4 g/dL (ref 13.0–17.7)
Immature Grans (Abs): 0 10*3/uL (ref 0.0–0.1)
Immature Granulocytes: 0 %
LYMPHS ABS: 2.6 10*3/uL (ref 0.7–3.1)
Lymphs: 29 %
MCH: 30.5 pg (ref 26.6–33.0)
MCHC: 34.9 g/dL (ref 31.5–35.7)
MCV: 88 fL (ref 79–97)
MONOS ABS: 0.9 10*3/uL (ref 0.1–0.9)
Monocytes: 10 %
Neutrophils Absolute: 5.3 10*3/uL (ref 1.4–7.0)
Neutrophils: 60 %
PLATELETS: 284 10*3/uL (ref 150–450)
RBC: 4.39 x10E6/uL (ref 4.14–5.80)
RDW: 13.6 % (ref 12.3–15.4)
WBC: 9 10*3/uL (ref 3.4–10.8)

## 2017-11-04 LAB — COMPREHENSIVE METABOLIC PANEL
A/G RATIO: 2.1 (ref 1.2–2.2)
ALBUMIN: 4.7 g/dL (ref 3.6–4.8)
ALT: 44 IU/L (ref 0–44)
AST: 36 IU/L (ref 0–40)
Alkaline Phosphatase: 98 IU/L (ref 39–117)
BILIRUBIN TOTAL: 1.2 mg/dL (ref 0.0–1.2)
BUN / CREAT RATIO: 13 (ref 10–24)
BUN: 13 mg/dL (ref 8–27)
CO2: 27 mmol/L (ref 20–29)
CREATININE: 1.03 mg/dL (ref 0.76–1.27)
Calcium: 10.2 mg/dL (ref 8.6–10.2)
Chloride: 98 mmol/L (ref 96–106)
GFR calc Af Amer: 90 mL/min/{1.73_m2} (ref >59)
GFR calc non Af Amer: 77 mL/min/{1.73_m2} (ref >59)
Globulin, Total: 2.2 g/dL (ref 1.5–4.5)
Glucose: 123 mg/dL — ABNORMAL HIGH (ref 65–99)
POTASSIUM: 4.2 mmol/L (ref 3.5–5.2)
SODIUM: 140 mmol/L (ref 134–144)
Total Protein: 6.9 g/dL (ref 6.0–8.5)

## 2017-11-06 ENCOUNTER — Ambulatory Visit: Payer: 59 | Admitting: Cardiovascular Disease

## 2017-11-06 ENCOUNTER — Encounter: Payer: Self-pay | Admitting: Cardiovascular Disease

## 2017-11-06 VITALS — BP 130/76 | HR 65 | Ht 69.0 in | Wt 261.1 lb

## 2017-11-06 DIAGNOSIS — I208 Other forms of angina pectoris: Secondary | ICD-10-CM | POA: Diagnosis not present

## 2017-11-06 MED ORDER — ISOSORBIDE MONONITRATE ER 30 MG PO TB24: 30.0000 mg | ORAL_TABLET | Freq: Every day | ORAL | 11 refills | Status: DC

## 2017-11-06 NOTE — Patient Instructions (Signed)
Medication Instructions:  1) START ISOSORBIDE 30 mg daily 2) STOP HCTZ 3) STOP KDUR  Labwork: None needed  Testing/Procedures: Your physician has requested that you have a cardiac catheterization. Cardiac catheterization is used to diagnose and/or treat various heart conditions. Doctors may recommend this procedure for a number of different reasons. The most common reason is to evaluate chest pain. Chest pain can be a symptom of coronary artery disease (CAD), and cardiac catheterization can show whether plaque is narrowing or blocking your heart's arteries. This procedure is also used to evaluate the valves, as well as measure the blood flow and oxygen levels in different parts of your heart. For further information please visit [email or web address]. Please follow instruction sheet, as given.  Follow-Up: Your provider recommends that you schedule a follow-up appointment AS NEEDED pending catheterization results.   Any Other Special Instructions Will Be Listed Below (If Applicable).     [facility] OFFICE [place], [place] [address] Dept: [phone] Loc: [place]  11/06/2017  You are scheduled for a Cardiac Catheterization on Thursday, August 22 with Dr. Daneen Schick.  1. Please arrive at the [facility] (Main Entrance A) at [facility]: [address] [place], [place] [address] at 5:30 AM (This time is two hours before your procedure to ensure your preparation). Free valet parking service is available.   Special note: Every effort is made to have your procedure done on time. Please understand that emergencies sometimes delay scheduled procedures.  2. Diet: Do not eat solid foods after midnight.  The patient may have clear liquids until 5am upon the day of the procedure.  3. Labs: none needed  4. Medication instructions in preparation for your  procedure:  1) MAKE SURE TO TAKE YOUR PLAVIX and ASPIRIN the morning of your cath  2) You may take your other meds as directed with your Plavix  5. Plan for one night stay--bring personal belongings. 6. Bring a current list of your medications and current insurance cards. 7. You MUST have a responsible person to drive you home. 8. Someone MUST be with you the first 24 hours after you arrive home or your discharge will be delayed. 9. Please wear clothes that are easy to get on and off and wear slip-on shoes.  Thank you for allowing [place] to care for you!   --  Invasive Cardiovascular services

## 2017-11-06 NOTE — H&P (View-Only) (Signed)
Cardiology Office Note Date:  11/07/2017   ID:  [name], DOB 10/03/1955, MRN [id number]  PCP:  [facility]  Cardiologist:  [name], MD    Chief Complaint  Patient presents with  . Chest Pain     History of Present Illness: [name] is a 62 y.o. male who presents for follow-up of CAD. The patient has a hx of remote CABG and he underwent stenting of 2 vein grafts in 2015 when he presented with ACS. He has been maintained on long term DAPT with ASA and clopidogrel.   He is here alone today. Concerned about recurrent angina. He has been doing well but over the past 3-4 months he has developed chest pain with exertion. Initially he had substernal discomfort with walking up hills. More recently he had fairly severe chest pain with push mowing a small lawn on level ground. He took NTG with relief but his symptoms lasted about 30 minutes. With low level activity such as walking on level ground at a slow pace he has no problems. He describes the pain as a 'stinging and burning pain' in the upper chest into the left neck. He has some shortness of breath associated but not severe or lifestyle limiting. No lightheadedness or syncope. No resting chest pain.    Past Medical History:  Diagnosis Date  . Arthritis   . CAD (coronary artery disease) 2000   a. History of MI ~2000 s/p PCI. b. CABG in 2006 (LIMA-LAD, RIMA-acute marginal, SVG-DX, SVG-RCA). c. NSTEMI 05/2013:severe stenoses of the SVG-PDA and SVG-diagonal, both treated successfully with PCI (DES x2).  Marland Kitchen GERD (gastroesophageal reflux disease)   . Gout   . History of hypertension   . Hydrocele 2012  . Hyperlipidemia   . Hypertension   . Myocardial infarction ([place]) 2014   x3  . Obesity     Past Surgical History:  Procedure Laterality Date  . CATARACT EXTRACTION W/PHACO Left 05/17/2012   Procedure: CATARACT EXTRACTION PHACO AND INTRAOCULAR LENS PLACEMENT (IOC);  Surgeon: Tonny Branch, MD;   Location: [facility];  Service: Ophthalmology;  Laterality: Left;  CDE:  0.76  . CATARACT EXTRACTION W/PHACO Right 08/26/2012   Procedure: CATARACT EXTRACTION PHACO AND INTRAOCULAR LENS PLACEMENT (IOC);  Surgeon: Tonny Branch, MD;  Location: [facility];  Service: Ophthalmology;  Laterality: Right;  CDE: 1.13  . CORONARY ANGIOPLASTY  2000   stents x2  . CORONARY ARTERY BYPASS GRAFT  2006   4 vessels  . Coronary artery bypass grafting  May 2006   Four-vessel  . FOOT FRACTURE SURGERY     right  . LEFT HEART CATHETERIZATION WITH CORONARY/GRAFT ANGIOGRAM N/A 05/05/2011   Procedure: LEFT HEART CATHETERIZATION WITH Beatrix Fetters;  Surgeon: Josue Hector, MD;  Location: [facility] CATH LAB;  Service: Cardiovascular;  Laterality: N/A;  . LEFT HEART CATHETERIZATION WITH CORONARY/GRAFT ANGIOGRAM N/A 06/06/2013   Procedure: LEFT HEART CATHETERIZATION WITH Beatrix Fetters;  Surgeon: Blane Ohara, MD;  Location: [facility] CATH LAB;  Service: Cardiovascular;  Laterality: N/A;  . MASS EXCISION Right 05/14/2016   Procedure: EXCISION MASS RIGHT ELBOW;  Surgeon: Carole Civil, MD;  Location: [facility];  Service: Orthopedics;  Laterality: Right;  . PILONIDAL CYST EXCISION    . TONSILLECTOMY      Current Outpatient Medications  Medication Sig Dispense Refill  . aspirin EC 81 MG tablet Take 81 mg by mouth daily.    Marland Kitchen atorvastatin (LIPITOR) 80 MG tablet Take 1 tablet (80 mg  total) by mouth daily. Please schedule yearly appt for more refills, thanks. [phone] 1st ATTMPT 30 tablet 0  . clopidogrel (PLAVIX) 75 MG tablet Take 1 tablet (75 mg total) by mouth daily. Please keep upcoming appt for future refills. Thank you 90 tablet 0  . colchicine 0.6 MG tablet Take 0.6 mg by mouth daily as needed. For gout flares.  3  . ezetimibe (ZETIA) 10 MG tablet TAKE 1 TABLET BY MOUTH EVERY DAY 30 tablet 4  . indomethacin (INDOCIN) 50 MG capsule Take 50 mg by mouth daily as needed. For gout pain.  3  . metoprolol tartrate  (LOPRESSOR) 25 MG tablet TAKE 1 TABLET BY MOUTH TWICE A DAY 180 tablet 3  . nitroGLYCERIN (NITROSTAT) 0.4 MG SL tablet PLACE 1 TABLET UNDER THE TONGUE EVERY 5 MINUTES AS NEEDED FOR CHEST PAIN. 25 tablet 0  . isosorbide mononitrate (IMDUR) 30 MG 24 hr tablet Take 1 tablet (30 mg total) by mouth daily. 30 tablet 11   No current facility-administered medications for this visit.     Allergies:   Hydrocodone   Social History:  The patient  reports that he has never smoked. He has never used smokeless tobacco. He reports that he does not drink alcohol or use drugs.   Family History:  The patient's  family history includes Cancer in his father and mother; Cervical cancer in his mother; Healthy in his daughter and son; Throat cancer in his father; Thyroid disease in his son.    ROS:  Please see the history of present illness.  All other systems are reviewed and negative.    PHYSICAL EXAM: VS:  BP 130/76   Pulse 65   Ht 5' 9"  (1.753 m)   Wt 261 lb 1.9 oz (118.4 kg)   SpO2 98%   BMI 38.56 kg/m  , BMI Body mass index is 38.56 kg/m. GEN: Well nourished, well developed, in no acute distress  HEENT: normal  Neck: no JVD, no masses. No carotid bruits Cardiac: RRR without murmur or gallop                Respiratory:  clear to auscultation bilaterally, normal work of breathing GI: soft, nontender, nondistended, + BS MS: no deformity or atrophy  Ext: no pretibial edema, pedal pulses 2+= bilaterally Skin: warm and dry, no rash Neuro:  Strength and sensation are intact Psych: euthymic mood, full affect  EKG:  EKG is ordered today. The ekg ordered today shows NSr 60 bpm, nonspecific ST abnormality.   Recent Labs: 11/04/2017: ALT 44; BUN 13; Creatinine, Ser 1.03; Hemoglobin 13.4; Platelets 284; Potassium 4.2; Sodium 140   Lipid Panel     Component Value Date/Time   CHOL 188 11/04/2017 0843   TRIG 151 (H) 11/04/2017 0843   HDL 40 11/04/2017 0843   CHOLHDL 4.7 11/04/2017 0843   CHOLHDL 3.4  05/07/2015 0834   VLDL 16 05/07/2015 0834   LDLCALC 118 (H) 11/04/2017 0843      Wt Readings from Last 3 Encounters:  11/06/17 261 lb 1.9 oz (118.4 kg)  06/06/16 257 lb 6.4 oz (116.8 kg)  05/09/16 257 lb (116.6 kg)     Cardiac Studies Reviewed: Echo 06-06-2013: Left ventricle: The cavity size was normal. Wall thickness was increased in a pattern of moderate LVH. Systolic function was normal. The estimated ejection fraction was in the range of 60% to 65%. Wall motion was normal; there were no regional wall motion abnormalities.  ------------------------------------------------------------ Aortic valve:  Structurally normal valve.  Cusp separation was normal. Doppler: Transvalvular velocity was within the normal range. There was no stenosis. No regurgitation.  ------------------------------------------------------------ Aorta: Aortic root: The aortic root was normal in size. Ascending aorta: The ascending aorta was normal in size.  ------------------------------------------------------------ Mitral valve:  Structurally normal valve.  Leaflet separation was normal. Doppler: Transvalvular velocity was within the normal range. There was no evidence for stenosis. No regurgitation.  ------------------------------------------------------------ Left atrium: The atrium was normal in size.  ------------------------------------------------------------ Right ventricle: The cavity size was normal. Systolic function was normal.  ------------------------------------------------------------ Pulmonic valve:  Poorly visualized. The valve appears to be grossly normal.  Doppler:  No significant regurgitation.  ------------------------------------------------------------ Tricuspid valve:  The valve appears to be grossly normal. Doppler:  Trivial regurgitation.  ------------------------------------------------------------ Right atrium: The atrium was normal in  size.  ------------------------------------------------------------ Pericardium: There was no pericardial effusion.  Cardiac Cath 06-06-2013: PROCEDURAL FINDINGS Hemodynamics: AO 136/69 LV 141/21              Coronary angiography: Coronary dominance: right  Left mainstem: calcified without obstructive disease  Left anterior descending (LAD): diffuse proximal disease with 80% ostial stenosis and 100% occlusion at the first septal perforator  Left circumflex (LCx): hypodense eccentric 70% stenosis in the proximal circumflex. OM1 has 80% stenosis, OM2 with diffuse nonobstructive disease  Right coronary artery (RCA): diffusely diseased in the proximal RCA. Total 100% occlusion in the mid-vessel.  LIMA-LAD: patent without disease. The LAD is diffusely diseased, especially in the apical portion with 80% apical stenosis  Free RIMA - OM: patent without obstructive disease  SVG - diagonal: 95% stenosis in mid-body of graft  SVG - PDA: 95% stenosis in proximal body of graft  Left ventriculography: Left ventricular systolic function is normal, LVEF is estimated at 55-65%, there is no significant mitral regurgitation   PCI Note:  Following the diagnostic procedure, the decision was made to proceed with PCI. I planned on performing PCI on both vein graft lesions. The vein graft to the diagonal appeared too small for distal protection. The patient was loaded with effient 60 mg on the table. Weight-based bivalirudin was given for anticoagulation. Once a therapeutic ACT was achieved, a 6 [place] AL-1 guide catheter was inserted.  IC verapamil was administered. A cougar coronary guidewire was used to cross the lesion.  The lesion was predilated with a 2.0 mm balloon.  The lesion was then stented with a 3.0x16 mm Promus DES stent.  The stent was postdilated with a 3.25 mm noncompliant balloon to 14 atm.  Following PCI, there was 0% residual stenosis and TIMI-3 flow. Attention was then turned  to the SVG-PDA. A MPA catheter was used. The same cougar wire was advanced across the lesion. A 3 mm Spider embolic protection device was prepped using normal technique. The device however would not traverse the aortic arch and it pushed the guide out of position on several attempts despite guide catheter manipulations. The device was removed and IC verapamil was again administered. The lesion was predilated with the same 2.0 mm balloon and a 2.5x24 mm Promus DES was used to treat the lesion. The stent was deployed at 12 atm and post-dilated with a 2.75 mm balloon to 16 atm. There was 0% residual stenosis and TIMI-3 flow at the completion of the procedure. Final angiography confirmed an excellent result. The patient tolerated the procedure well. There were no immediate procedural complications. A TR band was used for radial hemostasis. The patient was transferred to the post catheterization recovery area for  further monitoring.  PCI Data: LESION 1 Vessel - diagonal 1/Segment - mid-body of SVG Percent Stenosis (pre)  95 TIMI-flow 3 Stent 3.0x16 mm Promus DES Percent Stenosis (post) 0 TIMI-flow (post) 3  LESION 2 Vessel - PDA/Segment - Proximal body of SVG Percent Stenosis (pre)  95 TIMI-flow 3 Stent 2.5x24 mm Promus DES Percent Stenosis (post) 0 TIMI-flow (post) 3  Final Conclusions:   1. Severe 3 vessel CAD with total occlusion of the LAD and RCA and severe stenosis of the LCx 2. S/P CABG with continued patency of the LIMA-LAD and free RIMA-OM 3. Severe stenoses of the SVG-PDA and SVG-diagonal, both treated successfully with PCI 4. Normal LV function   Recommendations:  ASA and effient at least 12 months. Anticipate d/c tomorrow if no complications.  ASSESSMENT AND PLAN: 1.  CAD, native vessel and bypass graft disease, with angina: pt with new onset angina over the past 3-4 months with most severe episode recently requiring NTG and rest to relieve symptoms over 30 minute period.  With his known extensive CAD and prior bypass surgery and vein graft PCI, I have recommended definitive evaluation with cardiac catheterization and possible PCI. Will add isosorbide 30 mg daily to his medical regimen. He understands to seek immediate attention if progressive symptoms or rest pain. I have reviewed the risks, indications, and alternatives to cardiac catheterization, possible angioplasty, and stenting with the patient. Risks include but are not limited to bleeding, infection, vascular injury, stroke, myocardial infection, arrhythmia, kidney injury, radiation-related injury in the case of prolonged fluoroscopy use, emergency cardiac surgery, and death. The patient understands the risks of serious complication is 1-2 in 3943 with diagnostic cardiac cath and 1-2% or less with angioplasty/stenting. Will plan a left radial approach for cath.   2. Hyperlipidemia: pt on atorvastatin at high dose and zetia. Lifestyle modification discussed as well.   3. HTN: BP controlled. He we would like to DC HCTZ as he is no longer on his feet all day with associated edema. Isosorbide added today. Will stop HCT and potassium.   Current medicines are reviewed with the patient today.  The patient does not have concerns regarding medicines.  Labs/ tests ordered today include:   Orders Placed This Encounter  Procedures  . EKG 12-Lead    Disposition:   Cardiac catheterization next available time  Signed, [name], MD  11/07/2017 6:43 AM    [address], [place], [place]  [address] Phone: [phone]; Fax: [phone]

## 2017-11-06 NOTE — Progress Notes (Signed)
Cardiology Office Note Date:  11/07/2017   ID:  [name], DOB 1955/06/07, MRN [id number]  PCP:  [place]  Cardiologist:  [name], MD    Chief Complaint  Patient presents with  . Chest Pain     History of Present Illness: [name] is a 62 y.o. male who presents for follow-up of CAD. The patient has a hx of remote CABG and he underwent stenting of 2 vein grafts in 2015 when he presented with ACS. He has been maintained on long term DAPT with ASA and clopidogrel.   He is here alone today. Concerned about recurrent angina. He has been doing well but over the past 3-4 months he has developed chest pain with exertion. Initially he had substernal discomfort with walking up hills. More recently he had fairly severe chest pain with push mowing a small lawn on level ground. He took NTG with relief but his symptoms lasted about 30 minutes. With low level activity such as walking on level ground at a slow pace he has no problems. He describes the pain as a 'stinging and burning pain' in the upper chest into the left neck. He has some shortness of breath associated but not severe or lifestyle limiting. No lightheadedness or syncope. No resting chest pain.    Past Medical History:  Diagnosis Date  . Arthritis   . CAD (coronary artery disease) 2000   a. History of MI ~2000 s/p PCI. b. CABG in 2006 (LIMA-LAD, RIMA-acute marginal, SVG-DX, SVG-RCA). c. NSTEMI 05/2013:severe stenoses of the SVG-PDA and SVG-diagonal, both treated successfully with PCI (DES x2).  Marland Kitchen GERD (gastroesophageal reflux disease)   . Gout   . History of hypertension   . Hydrocele 2012  . Hyperlipidemia   . Hypertension   . Myocardial infarction ([place]) 2014   x3  . Obesity     Past Surgical History:  Procedure Laterality Date  . CATARACT EXTRACTION W/PHACO Left 05/17/2012   Procedure: CATARACT EXTRACTION PHACO AND INTRAOCULAR LENS PLACEMENT (IOC);  Surgeon: Tonny Branch, MD;   Location: [facility];  Service: Ophthalmology;  Laterality: Left;  CDE:  0.76  . CATARACT EXTRACTION W/PHACO Right 08/26/2012   Procedure: CATARACT EXTRACTION PHACO AND INTRAOCULAR LENS PLACEMENT (IOC);  Surgeon: Tonny Branch, MD;  Location: [facility];  Service: Ophthalmology;  Laterality: Right;  CDE: 1.13  . CORONARY ANGIOPLASTY  2000   stents x2  . CORONARY ARTERY BYPASS GRAFT  2006   4 vessels  . Coronary artery bypass grafting  May 2006   Four-vessel  . FOOT FRACTURE SURGERY     right  . LEFT HEART CATHETERIZATION WITH CORONARY/GRAFT ANGIOGRAM N/A 05/05/2011   Procedure: LEFT HEART CATHETERIZATION WITH Beatrix Fetters;  Surgeon: Josue Hector, MD;  Location: [facility] CATH LAB;  Service: Cardiovascular;  Laterality: N/A;  . LEFT HEART CATHETERIZATION WITH CORONARY/GRAFT ANGIOGRAM N/A 06/06/2013   Procedure: LEFT HEART CATHETERIZATION WITH Beatrix Fetters;  Surgeon: Blane Ohara, MD;  Location[facility] CATH LAB;  Service: Cardiovascular;  Laterality: N/A;  . MASS EXCISION Right 05/14/2016   Procedure: EXCISION MASS RIGHT ELBOW;  Surgeon: Carole Civil, MD;  Location: [facility];  Service: Orthopedics;  Laterality: Right;  . PILONIDAL CYST EXCISION    . TONSILLECTOMY      Current Outpatient Medications  Medication Sig Dispense Refill  . aspirin EC 81 MG tablet Take 81 mg by mouth daily.    Marland Kitchen atorvastatin (LIPITOR) 80 MG tablet Take 1 tablet (80 mg  total) by mouth daily. Please schedule yearly appt for more refills, thanks. [phone] 1st ATTMPT 30 tablet 0  . clopidogrel (PLAVIX) 75 MG tablet Take 1 tablet (75 mg total) by mouth daily. Please keep upcoming appt for future refills. Thank you 90 tablet 0  . colchicine 0.6 MG tablet Take 0.6 mg by mouth daily as needed. For gout flares.  3  . ezetimibe (ZETIA) 10 MG tablet TAKE 1 TABLET BY MOUTH EVERY DAY 30 tablet 4  . indomethacin (INDOCIN) 50 MG capsule Take 50 mg by mouth daily as needed. For gout pain.  3  . metoprolol tartrate  (LOPRESSOR) 25 MG tablet TAKE 1 TABLET BY MOUTH TWICE A DAY 180 tablet 3  . nitroGLYCERIN (NITROSTAT) 0.4 MG SL tablet PLACE 1 TABLET UNDER THE TONGUE EVERY 5 MINUTES AS NEEDED FOR CHEST PAIN. 25 tablet 0  . isosorbide mononitrate (IMDUR) 30 MG 24 hr tablet Take 1 tablet (30 mg total) by mouth daily. 30 tablet 11   No current facility-administered medications for this visit.     Allergies:   Hydrocodone   Social History:  The patient  reports that he has never smoked. He has never used smokeless tobacco. He reports that he does not drink alcohol or use drugs.   Family History:  The patient's  family history includes Cancer in his father and mother; Cervical cancer in his mother; Healthy in his daughter and son; Throat cancer in his father; Thyroid disease in his son.    ROS:  Please see the history of present illness.  All other systems are reviewed and negative.    PHYSICAL EXAM: VS:  BP 130/76   Pulse 65   Ht 5' 9"  (1.753 m)   Wt 261 lb 1.9 oz (118.4 kg)   SpO2 98%   BMI 38.56 kg/m  , BMI Body mass index is 38.56 kg/m. GEN: Well nourished, well developed, in no acute distress  HEENT: normal  Neck: no JVD, no masses. No carotid bruits Cardiac: RRR without murmur or gallop                Respiratory:  clear to auscultation bilaterally, normal work of breathing GI: soft, nontender, nondistended, + BS MS: no deformity or atrophy  Ext: no pretibial edema, pedal pulses 2+= bilaterally Skin: warm and dry, no rash Neuro:  Strength and sensation are intact Psych: euthymic mood, full affect  EKG:  EKG is ordered today. The ekg ordered today shows NSr 60 bpm, nonspecific ST abnormality.   Recent Labs: 11/04/2017: ALT 44; BUN 13; Creatinine, Ser 1.03; Hemoglobin 13.4; Platelets 284; Potassium 4.2; Sodium 140   Lipid Panel     Component Value Date/Time   CHOL 188 11/04/2017 0843   TRIG 151 (H) 11/04/2017 0843   HDL 40 11/04/2017 0843   CHOLHDL 4.7 11/04/2017 0843   CHOLHDL 3.4  05/07/2015 0834   VLDL 16 05/07/2015 0834   LDLCALC 118 (H) 11/04/2017 0843      Wt Readings from Last 3 Encounters:  11/06/17 261 lb 1.9 oz (118.4 kg)  06/06/16 257 lb 6.4 oz (116.8 kg)  05/09/16 257 lb (116.6 kg)     Cardiac Studies Reviewed: Echo 06-06-2013: Left ventricle: The cavity size was normal. Wall thickness was increased in a pattern of moderate LVH. Systolic function was normal. The estimated ejection fraction was in the range of 60% to 65%. Wall motion was normal; there were no regional wall motion abnormalities.  ------------------------------------------------------------ Aortic valve:  Structurally normal valve.  Cusp separation was normal. Doppler: Transvalvular velocity was within the normal range. There was no stenosis. No regurgitation.  ------------------------------------------------------------ Aorta: Aortic root: The aortic root was normal in size. Ascending aorta: The ascending aorta was normal in size.  ------------------------------------------------------------ Mitral valve:  Structurally normal valve.  Leaflet separation was normal. Doppler: Transvalvular velocity was within the normal range. There was no evidence for stenosis. No regurgitation.  ------------------------------------------------------------ Left atrium: The atrium was normal in size.  ------------------------------------------------------------ Right ventricle: The cavity size was normal. Systolic function was normal.  ------------------------------------------------------------ Pulmonic valve:  Poorly visualized. The valve appears to be grossly normal.  Doppler:  No significant regurgitation.  ------------------------------------------------------------ Tricuspid valve:  The valve appears to be grossly normal. Doppler:  Trivial regurgitation.  ------------------------------------------------------------ Right atrium: The atrium was normal in  size.  ------------------------------------------------------------ Pericardium: There was no pericardial effusion.  Cardiac Cath 06-06-2013: PROCEDURAL FINDINGS Hemodynamics: AO 136/69 LV 141/21              Coronary angiography: Coronary dominance: right  Left mainstem: calcified without obstructive disease  Left anterior descending (LAD): diffuse proximal disease with 80% ostial stenosis and 100% occlusion at the first septal perforator  Left circumflex (LCx): hypodense eccentric 70% stenosis in the proximal circumflex. OM1 has 80% stenosis, OM2 with diffuse nonobstructive disease  Right coronary artery (RCA): diffusely diseased in the proximal RCA. Total 100% occlusion in the mid-vessel.  LIMA-LAD: patent without disease. The LAD is diffusely diseased, especially in the apical portion with 80% apical stenosis  Free RIMA - OM: patent without obstructive disease  SVG - diagonal: 95% stenosis in mid-body of graft  SVG - PDA: 95% stenosis in proximal body of graft  Left ventriculography: Left ventricular systolic function is normal, LVEF is estimated at 55-65%, there is no significant mitral regurgitation   PCI Note:  Following the diagnostic procedure, the decision was made to proceed with PCI. I planned on performing PCI on both vein graft lesions. The vein graft to the diagonal appeared too small for distal protection. The patient was loaded with effient 60 mg on the table. Weight-based bivalirudin was given for anticoagulation. Once a therapeutic ACT was achieved, a 6 [place] AL-1 guide catheter was inserted.  IC verapamil was administered. A cougar coronary guidewire was used to cross the lesion.  The lesion was predilated with a 2.0 mm balloon.  The lesion was then stented with a 3.0x16 mm Promus DES stent.  The stent was postdilated with a 3.25 mm noncompliant balloon to 14 atm.  Following PCI, there was 0% residual stenosis and TIMI-3 flow. Attention was then turned  to the SVG-PDA. A MPA catheter was used. The same cougar wire was advanced across the lesion. A 3 mm Spider embolic protection device was prepped using normal technique. The device however would not traverse the aortic arch and it pushed the guide out of position on several attempts despite guide catheter manipulations. The device was removed and IC verapamil was again administered. The lesion was predilated with the same 2.0 mm balloon and a 2.5x24 mm Promus DES was used to treat the lesion. The stent was deployed at 12 atm and post-dilated with a 2.75 mm balloon to 16 atm. There was 0% residual stenosis and TIMI-3 flow at the completion of the procedure. Final angiography confirmed an excellent result. The patient tolerated the procedure well. There were no immediate procedural complications. A TR band was used for radial hemostasis. The patient was transferred to the post catheterization recovery area for  further monitoring.  PCI Data: LESION 1 Vessel - diagonal 1/Segment - mid-body of SVG Percent Stenosis (pre)  95 TIMI-flow 3 Stent 3.0x16 mm Promus DES Percent Stenosis (post) 0 TIMI-flow (post) 3  LESION 2 Vessel - PDA/Segment - Proximal body of SVG Percent Stenosis (pre)  95 TIMI-flow 3 Stent 2.5x24 mm Promus DES Percent Stenosis (post) 0 TIMI-flow (post) 3  Final Conclusions:   1. Severe 3 vessel CAD with total occlusion of the LAD and RCA and severe stenosis of the LCx 2. S/P CABG with continued patency of the LIMA-LAD and free RIMA-OM 3. Severe stenoses of the SVG-PDA and SVG-diagonal, both treated successfully with PCI 4. Normal LV function   Recommendations:  ASA and effient at least 12 months. Anticipate d/c tomorrow if no complications.  ASSESSMENT AND PLAN: 1.  CAD, native vessel and bypass graft disease, with angina: pt with new onset angina over the past 3-4 months with most severe episode recently requiring NTG and rest to relieve symptoms over 30 minute period.  With his known extensive CAD and prior bypass surgery and vein graft PCI, I have recommended definitive evaluation with cardiac catheterization and possible PCI. Will add isosorbide 30 mg daily to his medical regimen. He understands to seek immediate attention if progressive symptoms or rest pain. I have reviewed the risks, indications, and alternatives to cardiac catheterization, possible angioplasty, and stenting with the patient. Risks include but are not limited to bleeding, infection, vascular injury, stroke, myocardial infection, arrhythmia, kidney injury, radiation-related injury in the case of prolonged fluoroscopy use, emergency cardiac surgery, and death. The patient understands the risks of serious complication is 1-2 in 0379 with diagnostic cardiac cath and 1-2% or less with angioplasty/stenting. Will plan a left radial approach for cath.   2. Hyperlipidemia: pt on atorvastatin at high dose and zetia. Lifestyle modification discussed as well.   3. HTN: BP controlled. He we would like to DC HCTZ as he is no longer on his feet all day with associated edema. Isosorbide added today. Will stop HCT and potassium.   Current medicines are reviewed with the patient today.  The patient does not have concerns regarding medicines.  Labs/ tests ordered today include:   Orders Placed This Encounter  Procedures  . EKG 12-Lead    Disposition:   Cardiac catheterization next available time  Signed, [name], MD  11/07/2017 6:43 AM    [address] [place], [place], [place]  [address] Phone: [phone]; Fax: [phone]

## 2017-11-07 ENCOUNTER — Encounter: Payer: Self-pay | Admitting: Cardiovascular Disease

## 2017-11-09 ENCOUNTER — Telehealth: Payer: Self-pay

## 2017-11-09 NOTE — Telephone Encounter (Signed)
Patient agrees to reschedule cath to tomorrow with Dr. Burt Knack. He understands to arrive at 1130 for procedure. He was grateful for call.

## 2017-11-10 ENCOUNTER — Ambulatory Visit (HOSPITAL_COMMUNITY)
Admission: RE | Admit: 2017-11-10 | Discharge: 2017-11-11 | Disposition: A | Discharge status: Home / Self Care | Payer: 59 | Source: Ambulatory Visit | Attending: Cardiovascular Disease | Admitting: Cardiovascular Disease

## 2017-11-10 ENCOUNTER — Encounter (HOSPITAL_COMMUNITY)
Admission: RE | Disposition: A | Discharge status: Home / Self Care | Payer: Self-pay | Source: Ambulatory Visit | Attending: Cardiovascular Disease

## 2017-11-10 DIAGNOSIS — I25119 Atherosclerotic heart disease of native coronary artery with unspecified angina pectoris: Secondary | ICD-10-CM | POA: Diagnosis not present

## 2017-11-10 DIAGNOSIS — Z951 Presence of aortocoronary bypass graft: Secondary | ICD-10-CM | POA: Diagnosis not present

## 2017-11-10 DIAGNOSIS — E785 Hyperlipidemia, unspecified: Secondary | ICD-10-CM | POA: Insufficient documentation

## 2017-11-10 DIAGNOSIS — Z9889 Other specified postprocedural states: Secondary | ICD-10-CM | POA: Diagnosis not present

## 2017-11-10 DIAGNOSIS — Z79899 Other long term (current) drug therapy: Secondary | ICD-10-CM | POA: Diagnosis not present

## 2017-11-10 DIAGNOSIS — Z7902 Long term (current) use of antithrombotics/antiplatelets: Secondary | ICD-10-CM | POA: Insufficient documentation

## 2017-11-10 DIAGNOSIS — I25118 Atherosclerotic heart disease of native coronary artery with other forms of angina pectoris: Secondary | ICD-10-CM | POA: Diagnosis present

## 2017-11-10 DIAGNOSIS — Z7982 Long term (current) use of aspirin: Secondary | ICD-10-CM | POA: Diagnosis not present

## 2017-11-10 DIAGNOSIS — Y832 Surgical operation with anastomosis, bypass or graft as the cause of abnormal reaction of the patient, or of later complication, without mention of misadventure at the time of the procedure: Secondary | ICD-10-CM | POA: Insufficient documentation

## 2017-11-10 DIAGNOSIS — T82858A Stenosis of vascular prosthetic devices, implants and grafts, initial encounter: Secondary | ICD-10-CM | POA: Diagnosis not present

## 2017-11-10 DIAGNOSIS — Z885 Allergy status to narcotic agent status: Secondary | ICD-10-CM | POA: Diagnosis not present

## 2017-11-10 DIAGNOSIS — Z9861 Coronary angioplasty status: Secondary | ICD-10-CM

## 2017-11-10 DIAGNOSIS — I10 Essential (primary) hypertension: Secondary | ICD-10-CM | POA: Diagnosis not present

## 2017-11-10 DIAGNOSIS — I2582 Chronic total occlusion of coronary artery: Secondary | ICD-10-CM | POA: Insufficient documentation

## 2017-11-10 HISTORY — PX: LEFT HEART CATH AND CORS/GRAFTS ANGIOGRAPHY: CATH118250

## 2017-11-10 HISTORY — PX: CORONARY BALLOON ANGIOPLASTY: CATH118233

## 2017-11-10 HISTORY — DX: Coronary angioplasty status: Z98.61

## 2017-11-10 LAB — POCT ACTIVATED CLOTTING TIME
ACTIVATED CLOTTING TIME: 252 s
ACTIVATED CLOTTING TIME: 263 s
Activated Clotting Time: 279 seconds

## 2017-11-10 SURGERY — LEFT HEART CATH AND CORS/GRAFTS ANGIOGRAPHY
Anesthesia: LOCAL

## 2017-11-10 MED ORDER — VERAPAMIL HCL 2.5 MG/ML IV SOLN
INTRAVENOUS | Status: AC
  Filled 2017-11-10: qty 2

## 2017-11-10 MED ORDER — ISOSORBIDE MONONITRATE ER 30 MG PO TB24
30.0000 mg | ORAL_TABLET | Freq: Every day | ORAL | Status: DC
  Administered 2017-11-11: 30 mg via ORAL
  Filled 2017-11-10: qty 1

## 2017-11-10 MED ORDER — IOHEXOL 350 MG/ML SOLN
INTRAVENOUS | Status: DC | PRN
  Administered 2017-11-10: 245 mL via INTRA_ARTERIAL

## 2017-11-10 MED ORDER — LABETALOL HCL 5 MG/ML IV SOLN: 10.0000 mg | INTRAVENOUS | Status: AC | PRN

## 2017-11-10 MED ORDER — SODIUM CHLORIDE 0.9% FLUSH: 3.0000 mL | Freq: Two times a day (BID) | INTRAVENOUS | Status: DC

## 2017-11-10 MED ORDER — METOPROLOL TARTRATE 25 MG PO TABS
25.0000 mg | ORAL_TABLET | Freq: Two times a day (BID) | ORAL | Status: DC
  Administered 2017-11-10 – 2017-11-11 (×2): 25 mg via ORAL
  Filled 2017-11-10 (×2): qty 1

## 2017-11-10 MED ORDER — LIDOCAINE HCL (PF) 1 % IJ SOLN
INTRAMUSCULAR | Status: AC
  Filled 2017-11-10: qty 30

## 2017-11-10 MED ORDER — SODIUM CHLORIDE 0.9 % IV SOLN: 250.0000 mL | INTRAVENOUS | Status: DC | PRN

## 2017-11-10 MED ORDER — NITROGLYCERIN 1 MG/10 ML FOR IR/CATH LAB
INTRA_ARTERIAL | Status: DC | PRN
  Administered 2017-11-10: 200 ug via INTRACORONARY

## 2017-11-10 MED ORDER — NITROGLYCERIN 1 MG/10 ML FOR IR/CATH LAB
INTRA_ARTERIAL | Status: AC
  Filled 2017-11-10: qty 10

## 2017-11-10 MED ORDER — COLCHICINE 0.6 MG PO TABS: 0.6000 mg | ORAL_TABLET | Freq: Every day | ORAL | Status: DC | PRN

## 2017-11-10 MED ORDER — NITROGLYCERIN 0.4 MG SL SUBL: 0.4000 mg | SUBLINGUAL_TABLET | SUBLINGUAL | Status: DC | PRN

## 2017-11-10 MED ORDER — SODIUM CHLORIDE 0.9 % WEIGHT BASED INFUSION: 1.0000 mL/kg/h | INTRAVENOUS | Status: DC

## 2017-11-10 MED ORDER — ACETAMINOPHEN 325 MG PO TABS: 650.0000 mg | ORAL_TABLET | ORAL | Status: DC | PRN

## 2017-11-10 MED ORDER — SODIUM CHLORIDE 0.9 % WEIGHT BASED INFUSION
3.0000 mL/kg/h | INTRAVENOUS | Status: DC
  Administered 2017-11-10: 3 mL/kg/h via INTRAVENOUS

## 2017-11-10 MED ORDER — FENTANYL CITRATE (PF) 100 MCG/2ML IJ SOLN
INTRAMUSCULAR | Status: DC | PRN
  Administered 2017-11-10 (×2): 25 ug via INTRAVENOUS

## 2017-11-10 MED ORDER — SODIUM CHLORIDE 0.9 % WEIGHT BASED INFUSION: 1.0000 mL/kg/h | INTRAVENOUS | Status: AC

## 2017-11-10 MED ORDER — SODIUM CHLORIDE 0.9% FLUSH: 3.0000 mL | INTRAVENOUS | Status: DC | PRN

## 2017-11-10 MED ORDER — HEPARIN (PORCINE) IN NACL 1000-0.9 UT/500ML-% IV SOLN
INTRAVENOUS | Status: DC | PRN
  Administered 2017-11-10 (×2): 500 mL

## 2017-11-10 MED ORDER — HEPARIN (PORCINE) IN NACL 1000-0.9 UT/500ML-% IV SOLN
INTRAVENOUS | Status: AC
  Filled 2017-11-10: qty 500

## 2017-11-10 MED ORDER — ASPIRIN EC 81 MG PO TBEC
81.0000 mg | DELAYED_RELEASE_TABLET | Freq: Every day | ORAL | Status: DC
  Administered 2017-11-11: 10:00:00 81 mg via ORAL
  Filled 2017-11-10: qty 1

## 2017-11-10 MED ORDER — VERAPAMIL HCL 2.5 MG/ML IV SOLN
INTRAVENOUS | Status: DC | PRN
  Administered 2017-11-10: 10 mL via INTRA_ARTERIAL

## 2017-11-10 MED ORDER — ANGIOPLASTY BOOK
Freq: Once | Status: DC
  Filled 2017-11-10: qty 1

## 2017-11-10 MED ORDER — HEPARIN SODIUM (PORCINE) 1000 UNIT/ML IJ SOLN
INTRAMUSCULAR | Status: AC
  Filled 2017-11-10: qty 1

## 2017-11-10 MED ORDER — EZETIMIBE 10 MG PO TABS
10.0000 mg | ORAL_TABLET | Freq: Every day | ORAL | Status: DC
  Administered 2017-11-11: 10:00:00 10 mg via ORAL
  Filled 2017-11-10: qty 1

## 2017-11-10 MED ORDER — HYDRALAZINE HCL 20 MG/ML IJ SOLN: 5.0000 mg | INTRAMUSCULAR | Status: AC | PRN

## 2017-11-10 MED ORDER — MIDAZOLAM HCL 2 MG/2ML IJ SOLN
INTRAMUSCULAR | Status: AC
  Filled 2017-11-10: qty 2

## 2017-11-10 MED ORDER — FENTANYL CITRATE (PF) 100 MCG/2ML IJ SOLN
INTRAMUSCULAR | Status: AC
  Filled 2017-11-10: qty 2

## 2017-11-10 MED ORDER — HEPARIN SODIUM (PORCINE) 1000 UNIT/ML IJ SOLN
INTRAMUSCULAR | Status: DC | PRN
  Administered 2017-11-10: 3000 [IU] via INTRAVENOUS
  Administered 2017-11-10: 6000 [IU] via INTRAVENOUS
  Administered 2017-11-10: 4000 [IU] via INTRAVENOUS
  Administered 2017-11-10: 6000 [IU] via INTRAVENOUS

## 2017-11-10 MED ORDER — LIDOCAINE HCL (PF) 1 % IJ SOLN
INTRAMUSCULAR | Status: DC | PRN
  Administered 2017-11-10: 2 mL

## 2017-11-10 MED ORDER — MIDAZOLAM HCL 2 MG/2ML IJ SOLN
INTRAMUSCULAR | Status: DC | PRN
  Administered 2017-11-10: 2 mg via INTRAVENOUS
  Administered 2017-11-10: 1 mg via INTRAVENOUS

## 2017-11-10 MED ORDER — ONDANSETRON HCL 4 MG/2ML IJ SOLN: 4.0000 mg | Freq: Four times a day (QID) | INTRAMUSCULAR | Status: DC | PRN

## 2017-11-10 MED ORDER — THE SENSUOUS HEART BOOK
Freq: Once | Status: DC
  Filled 2017-11-10: qty 1

## 2017-11-10 MED ORDER — CLOPIDOGREL BISULFATE 75 MG PO TABS: 75.0000 mg | ORAL_TABLET | ORAL | Status: DC

## 2017-11-10 MED ORDER — ATORVASTATIN CALCIUM 80 MG PO TABS
80.0000 mg | ORAL_TABLET | Freq: Every day | ORAL | Status: DC
  Administered 2017-11-11: 10:00:00 80 mg via ORAL
  Filled 2017-11-10: qty 1

## 2017-11-10 MED ORDER — ASPIRIN 81 MG PO CHEW: 81.0000 mg | CHEWABLE_TABLET | ORAL | Status: DC

## 2017-11-10 MED ORDER — CLOPIDOGREL BISULFATE 75 MG PO TABS
75.0000 mg | ORAL_TABLET | Freq: Every day | ORAL | Status: DC
  Administered 2017-11-11: 75 mg via ORAL
  Filled 2017-11-10: qty 1

## 2017-11-10 SURGICAL SUPPLY — 19 items
BALLN SAPPHIRE 2.5X15 (BALLOONS) ×2
BALLN SAPPHIRE ~~LOC~~ 2.5X12 (BALLOONS) ×1 IMPLANT
BALLOON SAPPHIRE 2.5X15 (BALLOONS) IMPLANT
CATH INFINITI 5 FR IM (CATHETERS) ×1 IMPLANT
CATH INFINITI 5 FR MPA2 (CATHETERS) ×1 IMPLANT
CATH INFINITI 5FR AL1 (CATHETERS) ×1 IMPLANT
CATH INFINITI 5FR MULTPACK ANG (CATHETERS) ×1 IMPLANT
CATH LAUNCHER 5F EBU3.5 (CATHETERS) ×1 IMPLANT
CATH VISTA GUIDE 6FR XBLAD3.5 (CATHETERS) ×1 IMPLANT
DEVICE RAD COMP TR BAND LRG (VASCULAR PRODUCTS) ×1 IMPLANT
GLIDESHEATH SLEND SS 6F .021 (SHEATH) ×1 IMPLANT
GUIDEWIRE INQWIRE 1.5J.035X260 (WIRE) IMPLANT
INQWIRE 1.5J .035X260CM (WIRE) ×2
KIT ENCORE 26 ADVANTAGE (KITS) ×1 IMPLANT
KIT HEART LEFT (KITS) ×2 IMPLANT
PACK CARDIAC CATHETERIZATION (CUSTOM PROCEDURE TRAY) ×2 IMPLANT
TRANSDUCER W/STOPCOCK (MISCELLANEOUS) ×2 IMPLANT
TUBING CIL FLEX 10 FLL-RA (TUBING) ×2 IMPLANT
WIRE COUGAR XT STRL 190CM (WIRE) ×2 IMPLANT

## 2017-11-10 NOTE — Interval H&P Note (Signed)
Cath Lab Visit (complete for each Cath Lab visit)  Clinical Evaluation Leading to the Procedure:   ACS: No.  Non-ACS:    Anginal Classification: CCS III  Anti-ischemic medical therapy: Maximal Therapy (2 or more classes of medications)  Non-Invasive Test Results: No non-invasive testing performed  Prior CABG: Previous CABG      History and Physical Interval Note:  11/10/2017 2:04 PM  [name]  has presented today for surgery, with the diagnosis of CP  The various methods of treatment have been discussed with the patient and family. After consideration of risks, benefits and other options for treatment, the patient has consented to  Procedure(s): LEFT HEART CATH AND CORS/GRAFTS ANGIOGRAPHY (N/A) as a surgical intervention .  The patient's history has been reviewed, patient examined, no change in status, stable for surgery.  I have reviewed the patient's chart and labs.  Questions were answered to the patient's satisfaction.     [name]

## 2017-11-10 NOTE — Progress Notes (Signed)
Pt leaves cath lab holding area in stable condition. Lt radial site is CDI. Lt radial pulse is 2+.

## 2017-11-11 ENCOUNTER — Encounter (HOSPITAL_COMMUNITY): Payer: Self-pay | Admitting: Cardiovascular Disease

## 2017-11-11 ENCOUNTER — Other Ambulatory Visit: Payer: Self-pay

## 2017-11-11 DIAGNOSIS — T82858A Stenosis of vascular prosthetic devices, implants and grafts, initial encounter: Secondary | ICD-10-CM | POA: Diagnosis not present

## 2017-11-11 DIAGNOSIS — I2582 Chronic total occlusion of coronary artery: Secondary | ICD-10-CM | POA: Diagnosis not present

## 2017-11-11 DIAGNOSIS — Z9861 Coronary angioplasty status: Secondary | ICD-10-CM

## 2017-11-11 DIAGNOSIS — I25118 Atherosclerotic heart disease of native coronary artery with other forms of angina pectoris: Secondary | ICD-10-CM

## 2017-11-11 DIAGNOSIS — I10 Essential (primary) hypertension: Secondary | ICD-10-CM | POA: Diagnosis not present

## 2017-11-11 HISTORY — DX: Coronary angioplasty status: Z98.61

## 2017-11-11 LAB — CBC
HEMATOCRIT: 35.8 % — AB (ref 39.0–52.0)
Hemoglobin: 12 g/dL — ABNORMAL LOW (ref 13.0–17.0)
MCH: 30.2 pg (ref 26.0–34.0)
MCHC: 33.5 g/dL (ref 30.0–36.0)
MCV: 89.9 fL (ref 78.0–100.0)
Platelets: 223 10*3/uL (ref 150–400)
RBC: 3.98 MIL/uL — ABNORMAL LOW (ref 4.22–5.81)
RDW: 12.5 % (ref 11.5–15.5)
WBC: 6.4 10*3/uL (ref 4.0–10.5)

## 2017-11-11 LAB — BASIC METABOLIC PANEL
Anion gap: 9 (ref 5–15)
BUN: 13 mg/dL (ref 8–23)
CHLORIDE: 104 mmol/L (ref 98–111)
CO2: 26 mmol/L (ref 22–32)
Calcium: 8.6 mg/dL — ABNORMAL LOW (ref 8.9–10.3)
Creatinine, Ser: 1.02 mg/dL (ref 0.61–1.24)
GFR calc Af Amer: >60 mL/min (ref >60)
GFR calc non Af Amer: >60 mL/min (ref >60)
GLUCOSE: 129 mg/dL — AB (ref 70–99)
Potassium: 3.3 mmol/L — ABNORMAL LOW (ref 3.5–5.1)
Sodium: 139 mmol/L (ref 135–145)

## 2017-11-11 MED ORDER — ACETAMINOPHEN 325 MG PO TABS: 650.0000 mg | ORAL_TABLET | ORAL | Status: DC | PRN

## 2017-11-11 MED ORDER — POTASSIUM CHLORIDE CRYS ER 20 MEQ PO TBCR
40.0000 meq | EXTENDED_RELEASE_TABLET | Freq: Once | ORAL | Status: AC
  Administered 2017-11-11: 10:00:00 40 meq via ORAL
  Filled 2017-11-11: qty 2

## 2017-11-11 NOTE — Discharge Summary (Addendum)
Discharge Summary    Patient ID: ARDITH LEWMAN,  MRN: [id number], DOB/AGE: 1955/12/12 62 y.o.  Admit date: 11/10/2017 Discharge date: 11/11/2017  Primary Care Provider: [facility] Primary Cardiologist: [name], MD  Discharge Diagnoses    Principal Problem:   Atherosclerosis of coronary artery with stable angina pectoris [facility]) Active Problems:   S/P coronary angioplasty 11/10/17 to [place] hypertension   Coronary artery disease involving native coronary artery with angina pectoris (HCC)   Allergies Allergies  Allergen Reactions  . Hydrocodone Nausea Only    Diagnostic Studies/Procedures    Cardiac cath and PCI 11/10/17  1.  Severe native multivessel coronary artery disease with total occlusion of the RCA and total occlusion of the LAD, total occlusion of the first OM and severe stenosis of the proximal circumflex 2.  Status post aortocoronary bypass surgery with continued patency of the LIMA to LAD into a diffusely diseased mid and distal LAD, continued patency of the free RIMA to OM, and total occlusion of the saphenous vein graft to diagonal and RCA branches 3.  Successful balloon angioplasty of the proximal circumflex, unable to deliver a stent, proximal circumflex stenosis reduced from 75% to 25% 4.  Normal LVEDP  Recommendations: Ongoing medical therapy and will increase antianginal program as tolerated.  Likely not a candidate for redo CABG because of poor conduits noted at initial surgery, prior use of both the RIMA and LIMA, and poor distal targets.  Recommend dual antiplatelet therapy with Aspirin 81mg  daily and Clopidogrel 75mg  daily long-term (beyond 12 months) because of extensive CAD, prior CABG, multiple PCI procedures. _____________   History of Present Illness     62 yo M with hx of CABG in 2006 and PCI of 2 VG in 2015 was seen in the office 11/06/17 with recurrent angina.  He was on ASA and plavix and imdur added.   Cardiac cath was planned.  He was also on statin and zetia for HLD.    He presented for cardiac cath 11/10/17 and had cath without complications.    Hospital Course     Consultants: none   Pt did well post angioplasty.  Today he has walked with cardiac rehab without pain.  Feels well.  He has been seen and evaluated by Dr. Acie Fredrickson andfound stable for discharge.    _____________  Discharge Vitals Blood pressure 121/68, pulse (!) 53, temperature 98.1 F (36.7 C), temperature source Oral, resp. rate 16, height 5\' 9"  (1.753 m), weight 117 kg, SpO2 98 %.  Filed Weights   11/10/17 1139 11/11/17 0430  Weight: 117.9 kg 117 kg    Labs & Radiologic Studies    CBC Recent Labs    11/11/17 0211  WBC 6.4  HGB 12.0*  HCT 35.8*  MCV 89.9  PLT 532   Basic Metabolic Panel Recent Labs    11/11/17 0211  NA 139  K 3.3*  CL 104  CO2 26  GLUCOSE 129*  BUN 13  CREATININE 1.02  CALCIUM 8.6*   Liver Function Tests No results for input(s): AST, ALT, ALKPHOS, BILITOT, PROT, ALBUMIN in the last 72 hours. No results for input(s): LIPASE, AMYLASE in the last 72 hours. Cardiac Enzymes No results for input(s): CKTOTAL, CKMB, CKMBINDEX, TROPONINI in the last 72 hours. BNP Invalid input(s): POCBNP D-Dimer No results for input(s): DDIMER in the last 72 hours. Hemoglobin A1C No results for input(s): HGBA1C in the last 72 hours. Fasting Lipid Panel No results for  input(s): CHOL, HDL, LDLCALC, TRIG, CHOLHDL, LDLDIRECT in the last 72 hours. Thyroid Function Tests No results for input(s): TSH, T4TOTAL, T3FREE, THYROIDAB in the last 72 hours.  Invalid input(s): FREET3 _____________  No results found. Disposition   Pt is being discharged home today in good condition.  Follow-up Plans & Appointments   Continue taking the Imdur, (isosorbide)   Heart Healthy diet   Do not stop asprin or Plavix.   Call the office if any problems or recurrent chest pain.   Discuss Lipid clinic on your  next visit.   Call [facility] at [phone] if any bleeding, swelling or drainage at cath site.  May shower, no tub baths for 48 hours for groin sticks. No lifting over 5 pounds for 3 days.  No Driving for 3 days  Follow-up Information    [name], MD Follow up on 11/24/2017.   Specialty:  Cardiology Why:  at 11:30 AM with his NP Pecolia Ades  Contact information: [address] [place] [address] [phone]          Discharge Instructions    Amb Referral to Cardiac Rehabilitation   Complete by:  As directed    Diagnosis:  PTCA      Discharge Medications   Allergies as of 11/11/2017      Reactions   Hydrocodone Nausea Only      Medication List    STOP taking these medications   indomethacin 50 MG capsule Commonly known as:  INDOCIN     TAKE these medications   acetaminophen 325 MG tablet Commonly known as:  TYLENOL Take 2 tablets (650 mg total) by mouth every 4 (four) hours as needed for headache or mild pain.   aspirin EC 81 MG tablet Take 81 mg by mouth daily.   atorvastatin 80 MG tablet Commonly known as:  LIPITOR Take 1 tablet (80 mg total) by mouth daily. Please schedule yearly appt for more refills, thanks. [phone] 1st ATTMPT   clopidogrel 75 MG tablet Commonly known as:  PLAVIX Take 1 tablet (75 mg total) by mouth daily. Please keep upcoming appt for future refills. Thank you   colchicine 0.6 MG tablet Take 0.6 mg by mouth daily as needed (gout flares).   ezetimibe 10 MG tablet Commonly known as:  ZETIA TAKE 1 TABLET BY MOUTH EVERY DAY   isosorbide mononitrate 30 MG 24 hr tablet Commonly known as:  IMDUR Take 1 tablet (30 mg total) by mouth daily.   metoprolol tartrate 25 MG tablet Commonly known as:  LOPRESSOR TAKE 1 TABLET BY MOUTH TWICE A DAY   nitroGLYCERIN 0.4 MG SL tablet Commonly known as:  NITROSTAT PLACE 1 TABLET UNDER THE TONGUE EVERY 5 MINUTES AS NEEDED FOR CHEST  PAIN. What changed:  See the new instructions.        Acute coronary syndrome (MI, NSTEMI, STEMI, etc) this admission?: No.    Outstanding Labs/Studies   BMP and possible Lipid clinic.  LDL is still elevated despite Lipitor 80 and zetia.    Duration of Discharge Encounter   Greater than 30 minutes including physician time.  Signed, Cecilie Kicks, NP 11/11/2017, 11:02 AM  Attending Note:   The patient was seen and examined.  Agree with assessment and plan as noted above.  Changes made to the above note as needed.  Patient seen and independently examined with  Cecilie Kicks, NP .   We discussed all aspects of the encounter. I agree  with the assessment and plan as stated above.  1.   Coronary artery disease.  He status post  POBA of his prox LCx.   - unable to deliver a stent   He is very stable.  He denies any chest pain.  He is been walking this morning without any difficulty. Reviewed with him the cautions about weight lifting with his left wrist.  He will need to stay on aspirin and Plavix long-term.  He is stable for discharge.   I have spent a total of 40 minutes with patient reviewing hospital  notes , telemetry, EKGs, labs and examining patient as well as establishing an assessment and plan that was discussed with the patient. > 50% of time was spent in direct patient care.    Thayer Headings, Brooke Bonito., MD, [facility] 11/11/2017, 6:56 PM [address],  [place] Pager [phone]

## 2017-11-11 NOTE — Discharge Instructions (Signed)
Continue taking the Imdur, (isosorbide)   Heart Healthy diet   Do not stop asprin or Plavix.   Call the office if any problems or recurrent chest pain.   Discuss Lipid clinic on your next visit.   Call [facility] at [phone] if any bleeding, swelling or drainage at cath site.  May shower, no tub baths for 48 hours for groin sticks. No lifting over 5 pounds for 3 days.  No Driving for 3 days

## 2017-11-11 NOTE — Progress Notes (Signed)
TR BAND REMOVAL  LOCATION:    right radial  DEFLATED PER PROTOCOL:    Yes.    TIME BAND OFF / DRESSING APPLIED:    2200   SITE UPON ARRIVAL:    Level 0  SITE AFTER BAND REMOVAL:    Level 0  CIRCULATION SENSATION AND MOVEMENT:    Within Normal Limits   Yes.    COMMENTS:   Post TRBand instructions given, good cap refill

## 2017-11-11 NOTE — Progress Notes (Signed)
CARDIAC REHAB PHASE I   PRE:  Rate/Rhythm: 57 SB    BP: sitting 148/67    SaO2: 99 RA  MODE:  Ambulation: 1000 ft   POST:  Rate/Rhythm: 73 SR    BP: sitting 162/69     SaO2:   Pt able to ambulate 1000 ft including 30 deg incline without angina. Sts this was not enough to cause his angina previously. Sts it normally takes several flights of stairs or mowing the grass. Ed completed with pt and daughter. Good reception. Encouraged more exercise and diet control. Reinforced carrying NTG correctly. Will refer to [place]. He understands Plavix. [place], ACSM 11/11/2017 8:56 AM

## 2017-11-11 NOTE — Progress Notes (Addendum)
Progress Note  Patient Name: [name] Date of Encounter: 11/11/2017  Primary Cardiologist: [name], MD   Subjective   EKG SR no acute changes. No chest pain and no SOB though the incline he just walked does not usually cause his pain.    Inpatient Medications    Scheduled Meds: . angioplasty book   Does not apply Once  . aspirin EC  81 mg Oral Daily  . atorvastatin  80 mg Oral Daily  . clopidogrel  75 mg Oral Daily  . ezetimibe  10 mg Oral Daily  . isosorbide mononitrate  30 mg Oral Daily  . metoprolol tartrate  25 mg Oral BID  . sodium chloride flush  3 mL Intravenous Q12H  . the sensuous heart book   Does not apply Once   Continuous Infusions: . sodium chloride     PRN Meds: sodium chloride, acetaminophen, colchicine, nitroGLYCERIN, ondansetron (ZOFRAN) IV, sodium chloride flush   Vital Signs    Vitals:   11/10/17 2000 11/10/17 2120 11/11/17 0430 11/11/17 0704  BP: (!) 157/65  (!) 114/45 121/68  Pulse: 61 60 (!) 57 (!) 53  Resp: 17  13 16   Temp: 98.2 F (36.8 C)  97.6 F (36.4 C) 98.1 F (36.7 C)  TempSrc: Oral  Oral   SpO2: 98%  100% 98%  Weight:   117 kg   Height:        Intake/Output Summary (Last 24 hours) at 11/11/2017 0803 Last data filed at 11/10/2017 2028 Gross per 24 hour  Intake 240 ml  Output -  Net 240 ml   Filed Weights   11/10/17 1139 11/11/17 0430  Weight: 117.9 kg 117 kg    Telemetry    SR with PACs - Personally Reviewed  ECG    SR and no acute changes  - Personally Reviewed  Physical Exam   GEN: No acute distress.   Neck: No JVD Cardiac: RRR, no murmurs, rubs, or gallops. Lt wrist cath site without hematoma  Respiratory: Clear to auscultation bilaterally. GI: Soft, nontender, non-distended  MS: No edema; No deformity. Neuro:  Nonfocal  Psych: Normal affect   Labs    Chemistry Recent Labs  Lab 11/04/17 0843 11/11/17 0211  NA 140 139  K 4.2 3.3*  CL 98 104  CO2 27 26  GLUCOSE 123* 129*  BUN 13  13  CREATININE 1.03 1.02  CALCIUM 10.2 8.6*  PROT 6.9  --   ALBUMIN 4.7  --   AST 36  --   ALT 44  --   ALKPHOS 98  --   BILITOT 1.2  --   GFRNONAA 77 >60  GFRAA 90 >60  ANIONGAP  --  9     Hematology Recent Labs  Lab 11/04/17 0843 11/11/17 0211  WBC 9.0 6.4  RBC 4.39 3.98*  HGB 13.4 12.0*  HCT 38.4 35.8*  MCV 88 89.9  MCH 30.5 30.2  MCHC 34.9 33.5  RDW 13.6 12.5  PLT 284 223    Cardiac EnzymesNo results for input(s): TROPONINI in the last 168 hours. No results for input(s): TROPIPOC in the last 168 hours.   BNPNo results for input(s): BNP, PROBNP in the last 168 hours.   DDimer No results for input(s): DDIMER in the last 168 hours.   Radiology    No results found.  Cardiac Studies   11/10/17  Cardiac cath and PCI. 1.  Severe native multivessel coronary artery disease with total occlusion of the RCA and  total occlusion of the LAD, total occlusion of the first OM and severe stenosis of the proximal circumflex 2.  Status post aortocoronary bypass surgery with continued patency of the LIMA to LAD into a diffusely diseased mid and distal LAD, continued patency of the free RIMA to OM, and total occlusion of the saphenous vein graft to diagonal and RCA branches 3.  Successful balloon angioplasty of the proximal circumflex, unable to deliver a stent, proximal circumflex stenosis reduced from 75% to 25% 4.  Normal LVEDP  Recommendations: Ongoing medical therapy and will increase antianginal program as tolerated.  Likely not a candidate for redo CABG because of poor conduits noted at initial surgery, prior use of both the RIMA and LIMA, and poor distal targets.  Recommend dual antiplatelet therapy with Aspirin 81mg  daily and Clopidogrel 75mg  daily long-term (beyond 12 months) because of extensive CAD, prior CABG, multiple PCI procedures.  Patient Profile     62 y.o. male hx of remote CABG and he underwent stenting of 2 vein grafts in 2015 when he presented with ACS. He  has been maintained on long term DAPT with ASA and clopidogrel.   Seen in the office 11/06/17 with recurrent angina. NTG responsive.  Other hx HLD, HTN.  Assessment & Plan    Angina -no further episodes today post procedure.  S/p angioplasty of prox LCX- unable to deliver stent.  Likely not a candidate for redo CABG because of poor conduits noted at initial surgery, prior use of both the RIMA and LIMA, and poor distal targets. On asa and plavix (Imdur 30 was added prior to cath- defer to Dr. Acie Fredrickson whether to keep or stop. )   CAD with prior CABG 2006 and with yesterday's cath  patent of LIMA to LAD into a diffusely diseased mid and distal LAD, patent free RIMA to OM and total occlusion of SVG to diag and RCA branches.   Hypokalemia at 3.3--will give 40 meq Kdur now.   HLD with LDL of 118 --on lipitor 80 and zetia 10 (Zetia added 05/2016)  May need Repatha- possible send to lipid clinic   D/C home and follow up in 2 weeks with APP or Dr. Burt Knack.    For questions or updates, please contact [place] Please consult [email or web address] for contact info under Cardiology/STEMI.      Signed, Cecilie Kicks, NP  11/11/2017, 8:03 AM    Attending Note:   The patient was seen and examined.  Agree with assessment and plan as noted above.  Changes made to the above note as needed.  Patient seen and independently examined with Cecilie Kicks, NP .   We discussed all aspects of the encounter. I agree with the assessment and plan as stated above.  1.   Coronary artery disease.  He status post  POBA of his prox LCx.   - unable to deliver a stent   He is very stable.  He denies any chest pain.  He is been walking this morning without any difficulty. Reviewed with him the cautions about weight lifting with his left wrist.  He will need to stay on aspirin and Plavix long-term.  He is stable for discharge.   I have spent a total of 40 minutes with patient reviewing hospital  notes , telemetry, EKGs, labs and  examining patient as well as establishing an assessment and plan that was discussed with the patient. > 50% of time was spent in direct patient care.    Ramond Dial.,  MD, [facility] 11/11/2017, 10:19 AM [address]  [place] Pager [phone]

## 2017-11-17 ENCOUNTER — Encounter (HOSPITAL_COMMUNITY): Payer: Self-pay | Admitting: Cardiovascular Disease

## 2017-11-19 ENCOUNTER — Other Ambulatory Visit: Payer: Self-pay | Admitting: Cardiovascular Disease

## 2017-11-20 ENCOUNTER — Encounter: Payer: Self-pay | Admitting: Cardiology

## 2017-11-24 ENCOUNTER — Ambulatory Visit: Payer: 59 | Admitting: Cardiology

## 2017-11-24 ENCOUNTER — Encounter: Payer: Self-pay | Admitting: Cardiology

## 2017-11-24 VITALS — BP 130/78 | HR 48 | Ht 69.0 in | Wt 261.8 lb

## 2017-11-24 DIAGNOSIS — I10 Essential (primary) hypertension: Secondary | ICD-10-CM | POA: Diagnosis not present

## 2017-11-24 DIAGNOSIS — E7800 Pure hypercholesterolemia, unspecified: Secondary | ICD-10-CM

## 2017-11-24 DIAGNOSIS — R0683 Snoring: Secondary | ICD-10-CM

## 2017-11-24 DIAGNOSIS — Z9861 Coronary angioplasty status: Secondary | ICD-10-CM

## 2017-11-24 MED ORDER — ISOSORBIDE MONONITRATE ER 60 MG PO TB24: 60.0000 mg | ORAL_TABLET | Freq: Every day | ORAL | 11 refills | Status: DC

## 2017-11-24 NOTE — Patient Instructions (Addendum)
Medication Instructions: INCREASE: Imdur to 60 mg daily   Labwork: TODAY: BMET  Procedures/Testing: None Ordered  Follow-Up: Your physician recommends that you schedule a follow-up appointment in: 3 months with Dr.Cooper  Your physician has referred you to the lipid clinic   Your physician has referred you to have a sleepy study someone will contact you with an appointment    Any Additional Special Instructions Will Be Listed Below (If Applicable).  Evolocumab injection What is this medicine? EVOLOCUMAB (e voe LOK ue mab) is known as a PCSK9 inhibitor. It is used to lower the level of cholesterol in the blood. It may be used alone or in combination with other cholesterol-lowering drugs. This drug may also be used to reduce the risk of heart attack, stroke, and certain types of heart surgery in patients with heart disease. This medicine may be used for other purposes; ask your health care provider or pharmacist if you have questions. COMMON BRAND NAME(S): REPATHA What should I tell my health care provider before I take this medicine? They need to know if you have any of these conditions: -an unusual or allergic reaction to evolocumab, other medicines, foods, dyes, or preservatives -pregnant or trying to get pregnant -breast-feeding How should I use this medicine? This medicine is for injection under the skin. You will be taught how to prepare and give this medicine. Use exactly as directed. Take your medicine at regular intervals. Do not take your medicine more often than directed. It is important that you put your used needles and syringes in a special sharps container. Do not put them in a trash can. If you do not have a sharps container, call your pharmacist or health care provider to get one. Talk to your pediatrician regarding the use of this medicine in children. While this drug may be prescribed for children as young as 13 years for selected conditions, precautions do  apply. Overdosage: If you think you have taken too much of this medicine contact a poison control center or emergency room at once. NOTE: This medicine is only for you. Do not share this medicine with others. What if I miss a dose? If you miss a dose, take it as soon as you can if there are more than 7 days until the next scheduled dose, or skip the missed dose and take the next dose according to your original schedule. Do not take double or extra doses. What may interact with this medicine? Interactions are not expected. This list may not describe all possible interactions. Give your health care provider a list of all the medicines, herbs, non-prescription drugs, or dietary supplements you use. Also tell them if you smoke, drink alcohol, or use illegal drugs. Some items may interact with your medicine. What should I watch for while using this medicine? You may need blood work while you are taking this medicine. What side effects may I notice from receiving this medicine? Side effects that you should report to your doctor or health care professional as soon as possible: -allergic reactions like skin rash, itching or hives, swelling of the face, lips, or tongue -signs and symptoms of infection like fever or chills; cough; sore throat; pain or trouble passing urine Side effects that usually do not require medical attention (report to your doctor or health care professional if they continue or are bothersome): -diarrhea -nausea -muscle pain -pain, redness, or irritation at site where injected This list may not describe all possible side effects. Call your doctor for medical  advice about side effects. You may report side effects to FDA at 1-800-FDA-1088. Where should I keep my medicine? Keep out of the reach of children. You will be instructed on how to store this medicine. Throw away any unused medicine after the expiration date on the label. NOTE: This sheet is a summary. It may not cover all  possible information. If you have questions about this medicine, talk to your doctor, pharmacist, or health care provider.  2018 Elsevier/Gold Standard (2016-02-25 13:21:53)    DASH Eating Plan DASH stands for "Dietary Approaches to Stop Hypertension." The DASH eating plan is a healthy eating plan that has been shown to reduce high blood pressure (hypertension). It may also reduce your risk for type 2 diabetes, heart disease, and stroke. The DASH eating plan may also help with weight loss. What are tips for following this plan? General guidelines  Avoid eating more than 2,300 mg (milligrams) of salt (sodium) a day. If you have hypertension, you may need to reduce your sodium intake to 1,500 mg a day.  Limit alcohol intake to no more than 1 drink a day for nonpregnant women and 2 drinks a day for men. One drink equals 12 oz of beer, 5 oz of wine, or 1 oz of hard liquor.  Work with your health care provider to maintain a healthy body weight or to lose weight. Ask what an ideal weight is for you.  Get at least 30 minutes of exercise that causes your heart to beat faster (aerobic exercise) most days of the week. Activities may include walking, swimming, or biking.  Work with your health care provider or diet and nutrition specialist (dietitian) to adjust your eating plan to your individual calorie needs. Reading food labels  Check food labels for the amount of sodium per serving. Choose foods with less than 5 percent of the Daily Value of sodium. Generally, foods with less than 300 mg of sodium per serving fit into this eating plan.  To find whole grains, look for the word "whole" as the first word in the ingredient list. Shopping  Buy products labeled as "low-sodium" or "no salt added."  Buy fresh foods. Avoid canned foods and premade or frozen meals. Cooking  Avoid adding salt when cooking. Use salt-free seasonings or herbs instead of table salt or sea salt. Check with your health care  provider or pharmacist before using salt substitutes.  Do not fry foods. Cook foods using healthy methods such as baking, boiling, grilling, and broiling instead.  Cook with heart-healthy oils, such as olive, canola, soybean, or sunflower oil. Meal planning   Eat a balanced diet that includes: ? 5 or more servings of fruits and vegetables each day. At each meal, try to fill half of your plate with fruits and vegetables. ? Up to 6-8 servings of whole grains each day. ? Less than 6 oz of lean meat, poultry, or fish each day. A 3-oz serving of meat is about the same size as a deck of cards. One egg equals 1 oz. ? 2 servings of low-fat dairy each day. ? A serving of nuts, seeds, or beans 5 times each week. ? Heart-healthy fats. Healthy fats called Omega-3 fatty acids are found in foods such as flaxseeds and coldwater fish, like sardines, salmon, and mackerel.  Limit how much you eat of the following: ? Canned or prepackaged foods. ? Food that is high in trans fat, such as fried foods. ? Food that is high in saturated fat, such  as fatty meat. ? Sweets, desserts, sugary drinks, and other foods with added sugar. ? Full-fat dairy products.  Do not salt foods before eating.  Try to eat at least 2 vegetarian meals each week.  Eat more home-cooked food and less restaurant, buffet, and fast food.  When eating at a restaurant, ask that your food be prepared with less salt or no salt, if possible. What foods are recommended? The items listed may not be a complete list. Talk with your dietitian about what dietary choices are best for you. Grains Whole-grain or whole-wheat bread. Whole-grain or whole-wheat pasta. Brown rice. [place]. Bulgur. Whole-grain and low-sodium cereals. Pita bread. Low-fat, low-sodium crackers. Whole-wheat flour tortillas. Vegetables Fresh or frozen vegetables (raw, steamed, roasted, or grilled). Low-sodium or reduced-sodium tomato and vegetable juice. Low-sodium or  reduced-sodium tomato sauce and tomato paste. Low-sodium or reduced-sodium canned vegetables. Fruits All fresh, dried, or frozen fruit. Canned fruit in natural juice (without added sugar). Meat and other protein foods Skinless chicken or [place]. Ground chicken or [place]. Pork with fat trimmed off. Fish and seafood. Egg whites. Dried beans, peas, or lentils. Unsalted nuts, nut butters, and seeds. Unsalted canned beans. Lean cuts of beef with fat trimmed off. Low-sodium, lean deli meat. Dairy Low-fat (1%) or fat-free (skim) milk. Fat-free, low-fat, or reduced-fat cheeses. Nonfat, low-sodium ricotta or cottage cheese. Low-fat or nonfat yogurt. Low-fat, low-sodium cheese. Fats and oils Soft margarine without trans fats. Vegetable oil. Low-fat, reduced-fat, or light mayonnaise and salad dressings (reduced-sodium). Canola, safflower, olive, soybean, and sunflower oils. Avocado. Seasoning and other foods Herbs. Spices. Seasoning mixes without salt. Unsalted popcorn and pretzels. Fat-free sweets. What foods are not recommended? The items listed may not be a complete list. Talk with your dietitian about what dietary choices are best for you. Grains Baked goods made with fat, such as croissants, muffins, or some breads. Dry pasta or rice meal packs. Vegetables Creamed or fried vegetables. Vegetables in a cheese sauce. Regular canned vegetables (not low-sodium or reduced-sodium). Regular canned tomato sauce and paste (not low-sodium or reduced-sodium). Regular tomato and vegetable juice (not low-sodium or reduced-sodium). Angie Fava. Olives. Fruits Canned fruit in a light or heavy syrup. Fried fruit. Fruit in cream or butter sauce. Meat and other protein foods Fatty cuts of meat. Ribs. Fried meat. Berniece Salines. Sausage. Bologna and other processed lunch meats. Salami. Fatback. Hotdogs. Bratwurst. Salted nuts and seeds. Canned beans with added salt. Canned or smoked fish. Whole eggs or egg yolks. Chicken or [place]  with skin. Dairy Whole or 2% milk, cream, and half-and-half. Whole or full-fat cream cheese. Whole-fat or sweetened yogurt. Full-fat cheese. Nondairy creamers. Whipped toppings. Processed cheese and cheese spreads. Fats and oils Butter. Stick margarine. Lard. Shortening. Ghee. Bacon fat. Tropical oils, such as coconut, palm kernel, or palm oil. Seasoning and other foods Salted popcorn and pretzels. Onion salt, garlic salt, seasoned salt, table salt, and sea salt. Worcestershire sauce. Tartar sauce. Barbecue sauce. Teriyaki sauce. Soy sauce, including reduced-sodium. Steak sauce. Canned and packaged gravies. Fish sauce. Oyster sauce. Cocktail sauce. Horseradish that you find on the shelf. Ketchup. Mustard. Meat flavorings and tenderizers. Bouillon cubes. Hot sauce and Tabasco sauce. Premade or packaged marinades. Premade or packaged taco seasonings. Relishes. Regular salad dressings. Where to find more information:  National Heart, Lung, and [place]: https://wilson-eaton.com/  American Heart Association: www.heart.org Summary  The DASH eating plan is a healthy eating plan that has been shown to reduce high blood pressure (hypertension). It may also reduce your risk  for type 2 diabetes, heart disease, and stroke.  With the DASH eating plan, you should limit salt (sodium) intake to 2,300 mg a day. If you have hypertension, you may need to reduce your sodium intake to 1,500 mg a day.  When on the DASH eating plan, aim to eat more fresh fruits and vegetables, whole grains, lean proteins, low-fat dairy, and heart-healthy fats.  Work with your health care provider or diet and nutrition specialist (dietitian) to adjust your eating plan to your individual calorie needs. This information is not intended to replace advice given to you by your health care provider. Make sure you discuss any questions you have with your health care provider. Document Released: 02/27/2011 Document Revised: 03/03/2016  Document Reviewed: 03/03/2016 Elsevier Interactive Patient Education  [employer].   If you need a refill on your cardiac medications before your next appointment, please call your pharmacy.

## 2017-11-24 NOTE — Progress Notes (Signed)
Cardiology Office Note:    Date:  11/24/2017   ID:  [name], DOB [birth year], MRN [id number]  PCP:  [facility]  Cardiologist:  [name], MD  Referring MD: [facility]   Chief Complaint  Patient presents with  . Hospitalization Follow-up    CAD    History of Present Illness:    [name] is a 62 y.o. male with a past medical history significant for CAD s/p CABG 2006 and PCI of 2 vein grafts in 2015, HTN, HLD, GERD. He presented to the office on 11/06/17 with c/o exertional angina over the prior 3-4 months. He was set up for a cardiac cath which was done on 11/10/17 with findings of: Patent LIMA to LAD into a diffusely diseased mid and distal LAD, patent free RIMA to OM, total occlusion of SVG to diagonal and RCA branches. He underwent successful balloon angioplasty of the prox circ, unable to stent. Prox Circ stenosis reduced from 75% to 25%. He was felt not to be a candidate for redo CABG because of poor distal targets. He is recommended for DAPT with aspirin 81 mg and Clopidogrel 75 mg daily long term (beyond 12 months) because of extensive CAD, prior CABG and multiple PCI procedures. Isosorbide was added.   He is here today for hospital follow up alone. His angina is slightly improved with the addition of Imdur. He has started walking 2 miles per day, approximately 30 minutes. He has stopped eating bread and has cut back on sugar intake. He is only getting mild upper chest pressure/ burning with walking up hill. It stops with rest and he has not required any SL NTG. He has no angina with walking on flat ground.   In reviewing risk factors the patient has central obesity and a large neck. He admits that he snores loudly and often wakes himself up. He has never been tested for sleep apnea.   Past Medical History:  Diagnosis Date  . Arthritis   . CAD (coronary artery disease) 2000   a. History of MI ~2000 s/p PCI. b. CABG in 2006  (LIMA-LAD, RIMA-acute marginal, SVG-DX, SVG-RCA). c. NSTEMI 05/2013:severe stenoses of the SVG-PDA and SVG-diagonal, both treated successfully with PCI (DES x2).  Marland Kitchen GERD (gastroesophageal reflux disease)   . Gout   . History of hypertension   . Hydrocele 2012  . Hyperlipidemia   . Hypertension   . Myocardial infarction ([place]) 2014   x3  . Obesity   . S/P coronary angioplasty 11/10/17 11/11/2017    Past Surgical History:  Procedure Laterality Date  . CATARACT EXTRACTION W/PHACO Left 05/17/2012   Procedure: CATARACT EXTRACTION PHACO AND INTRAOCULAR LENS PLACEMENT (IOC);  Surgeon: Tonny Branch, MD;  Location: [facility];  Service: Ophthalmology;  Laterality: Left;  CDE:  0.76  . CATARACT EXTRACTION W/PHACO Right 08/26/2012   Procedure: CATARACT EXTRACTION PHACO AND INTRAOCULAR LENS PLACEMENT (IOC);  Surgeon: Tonny Branch, MD;  Location: [facility];  Service: Ophthalmology;  Laterality: Right;  CDE: 1.13  . CORONARY ANGIOPLASTY  2000   stents x2  . CORONARY ARTERY BYPASS GRAFT  2006   4 vessels  . Coronary artery bypass grafting  May 2006   Four-vessel  . CORONARY BALLOON ANGIOPLASTY N/A 11/10/2017   Procedure: CORONARY BALLOON ANGIOPLASTY;  Surgeon: [name], MD;  Location: [facility];  Service: Cardiovascular;  Laterality: N/A;  . FOOT FRACTURE SURGERY     right  . LEFT HEART CATH AND  CORS/GRAFTS ANGIOGRAPHY N/A 11/10/2017   Procedure: LEFT HEART CATH AND CORS/GRAFTS ANGIOGRAPHY;  Surgeon: [name], MD;  Location: [facility];  Service: Cardiovascular;  Laterality: N/A;  . LEFT HEART CATHETERIZATION WITH CORONARY/GRAFT ANGIOGRAM N/A 05/05/2011   Procedure: LEFT HEART CATHETERIZATION WITH Beatrix Fetters;  Surgeon: Josue Hector, MD;  Location: [facility];  Service: Cardiovascular;  Laterality: N/A;  . LEFT HEART CATHETERIZATION WITH CORONARY/GRAFT ANGIOGRAM N/A 06/06/2013   Procedure: LEFT HEART CATHETERIZATION WITH Beatrix Fetters;  Surgeon: Blane Ohara, MD;  Location: [facility] CATH LAB;  Service: Cardiovascular;  Laterality: N/A;  . MASS EXCISION Right 05/14/2016   Procedure: EXCISION MASS RIGHT ELBOW;  Surgeon: Carole Civil, MD;  Location: [facility];  Service: Orthopedics;  Laterality: Right;  . PILONIDAL CYST EXCISION    . TONSILLECTOMY      Current Medications: Current Meds  Medication Sig  . acetaminophen (TYLENOL) 325 MG tablet Take 2 tablets (650 mg total) by mouth every 4 (four) hours as needed for headache or mild pain.  Marland Kitchen aspirin EC 81 MG tablet Take 81 mg by mouth daily.  Marland Kitchen atorvastatin (LIPITOR) 80 MG tablet Take 1 tablet (80 mg total) by mouth daily. Please schedule yearly appt for more refills, thanks. [phone] 1st ATTMPT  . clopidogrel (PLAVIX) 75 MG tablet Take 1 tablet (75 mg total) by mouth daily. Please keep upcoming appt for future refills. Thank you  . colchicine 0.6 MG tablet Take 0.6 mg by mouth daily as needed (gout flares).   . ezetimibe (ZETIA) 10 MG tablet TAKE 1 TABLET BY MOUTH EVERY DAY  . isosorbide mononitrate (IMDUR) 60 MG 24 hr tablet Take 1 tablet (60 mg total) by mouth daily.  . metoprolol tartrate (LOPRESSOR) 25 MG tablet TAKE 1 TABLET BY MOUTH TWICE A DAY  . nitroGLYCERIN (NITROSTAT) 0.4 MG SL tablet PLACE 1 TABLET UNDER THE TONGUE EVERY 5 MINUTES AS NEEDED FOR CHEST PAIN. (Patient taking differently: Place 0.4 mg under the tongue every 5 (five) minutes as needed for chest pain. )  . [DISCONTINUED] isosorbide mononitrate (IMDUR) 30 MG 24 hr tablet Take 1 tablet (30 mg total) by mouth daily.     Allergies:   Hydrocodone   Social History   Socioeconomic History  . Marital status: Divorced    Spouse name: Not on file  . Number of children: Not on file  . Years of education: Not on file  . Highest education level: Not on file  Occupational History  . Not on file  Social Needs  . Financial resource strain: Not on file  . Food insecurity:    Worry: Not on file    Inability: Not on file  .  Transportation needs:    Medical: Not on file    Non-medical: Not on file  Tobacco Use  . Smoking status: Never Smoker  . Smokeless tobacco: Never Used  Substance and Sexual Activity  . Alcohol use: No  . Drug use: No  . Sexual activity: Yes    Birth control/protection: None  Lifestyle  . Physical activity:    Days per week: Not on file    Minutes per session: Not on file  . Stress: Not on file  Relationships  . Social connections:    Talks on phone: Not on file    Gets together: Not on file    Attends religious service: Not on file    Active member of club or organization: Not on file    Attends  meetings of clubs or organizations: Not on file    Relationship status: Not on file  Other Topics Concern  . Not on file  Social History Narrative  . Not on file     Family History: The patient's family history includes Cancer in his father and mother; Cervical cancer in his mother; Healthy in his daughter and son; Throat cancer in his father; Thyroid disease in his son. ROS:   Please see the history of present illness.     All other systems reviewed and are negative.  EKGs/Labs/Other Studies Reviewed:    The following studies were reviewed today:  Cardiac cath and PCI 11/10/17  1. Severe native multivessel coronary artery disease with total occlusion of the RCA and total occlusion of the LAD, total occlusion of the first OM and severe stenosis of the proximal circumflex 2. Status post aortocoronary bypass surgery with continued patency of the LIMA to LAD into a diffusely diseased mid and distal LAD, continued patency of the free RIMA to OM, and total occlusion of the saphenous vein graft to diagonal and RCA branches 3. Successful balloon angioplasty of the proximal circumflex, unable to deliver a stent, proximal circumflex stenosis reduced from 75% to 25% 4. Normal LVEDP  Recommendations: Ongoing medical therapy and will increase antianginal program as tolerated. Likely  not a candidate for redo CABG because of poor conduits noted at initial surgery, prior use of both the RIMA and LIMA, and poor distal targets.  Recommend dual antiplatelet therapy with Aspirin 81mg  daily and Clopidogrel 75mg  dailylong-term (beyond 12 months) because of extensive CAD, prior CABG, multiple PCI procedures. _____________   EKG:  EKG is not ordered today.    Recent Labs: 11/04/2017: ALT 44 11/11/2017: BUN 13; Creatinine, Ser 1.02; Hemoglobin 12.0; Platelets 223; Potassium 3.3; Sodium 139   Recent Lipid Panel    Component Value Date/Time   CHOL 188 11/04/2017 0843   TRIG 151 (H) 11/04/2017 0843   HDL 40 11/04/2017 0843   CHOLHDL 4.7 11/04/2017 0843   CHOLHDL 3.4 05/07/2015 0834   VLDL 16 05/07/2015 0834   LDLCALC 118 (H) 11/04/2017 0843    Physical Exam:    VS:  BP 130/78   Pulse (!) 48   Ht 5\' 9"  (1.753 m)   Wt 261 lb 12.8 oz (118.8 kg)   BMI 38.66 kg/m     Wt Readings from Last 3 Encounters:  11/24/17 261 lb 12.8 oz (118.8 kg)  11/11/17 257 lb 15 oz (117 kg)  11/06/17 261 lb 1.9 oz (118.4 kg)     Physical Exam  Constitutional: He is oriented to person, place, and time. He appears well-developed and well-nourished. No distress.  HENT:  Head: Normocephalic and atraumatic.  Neck: Normal range of motion. Neck supple. No JVD present.  Cardiovascular: Regular rhythm, normal heart sounds and intact distal pulses. Bradycardia present. Exam reveals no gallop and no friction rub.  No murmur heard. Pulmonary/Chest: Effort normal and breath sounds normal.  Abdominal: Soft. Bowel sounds are normal.  Central obesity  Musculoskeletal: Normal range of motion. He exhibits no edema or deformity.  Neurological: He is alert and oriented to person, place, and time.  Skin: Skin is warm and dry.  Psychiatric: He has a normal mood and affect. His behavior is normal. Judgment and thought content normal.  Vitals reviewed.    ASSESSMENT:    1. S/P coronary angioplasty  11/10/17 to [place]   2. Essential hypertension   3. Pure hypercholesterolemia   4. Snoring  PLAN:    In order of problems listed above:  CAD: Hx of CABG in 2006, muiltiple PCI. Presented with increased angina. S/P POBA of prox CIrc 11/10/17. Unable to stent. Other significant chronic obstructions are stable. Conitnuing on DAPT long term due to his significant CAD history. His angina has improved with addition of Imdur. Still has mild chest burning with walking up hill. Will increase Imdur. We reviewed risk factor modification including heart healthy diet, exercise, BP and cholesterol control.   Hypokalemia:  K+ 3.3 at hosp. Will recheck Bmet. He is now off HCTZ and KDur.   Hypertension: BP well controlled. He has sufficient BP to accommodate increased Imdur.   Hyperlipidemia: On high intensity statin, atorvastatin 80 mg and Zetia. Goal LDL is <70. LDL was 118 on 11/04/17.  LDL was 62 in 08/2016. He reports compliance with medical therapy. He has gained some weight. I will refer to the lipid clinic for consideration of PCSK9-I.   Snoring: Pt admits to loud snoring that often wakes him up. Will check sleep study for possible OSA. He is agreeable.    Medication Adjustments/Labs and Tests Ordered: Current medicines are reviewed at length with the patient today.  Concerns regarding medicines are outlined above. Labs and tests ordered and medication changes are outlined in the patient instructions below:  Patient Instructions  Medication Instructions: INCREASE: Imdur to 60 mg daily   Labwork: TODAY: BMET  Procedures/Testing: None Ordered  Follow-Up: Your physician recommends that you schedule a follow-up appointment in: 3 months with Dr.Cooper  Your physician has referred you to the lipid clinic   Your physician has referred you to have a sleepy study someone will contact you with an appointment    Any Additional Special Instructions Will Be Listed Below (If Applicable).  Evolocumab  injection What is this medicine? EVOLOCUMAB (e voe LOK ue mab) is known as a PCSK9 inhibitor. It is used to lower the level of cholesterol in the blood. It may be used alone or in combination with other cholesterol-lowering drugs. This drug may also be used to reduce the risk of heart attack, stroke, and certain types of heart surgery in patients with heart disease. This medicine may be used for other purposes; ask your health care provider or pharmacist if you have questions. COMMON BRAND NAME(S): REPATHA What should I tell my health care provider before I take this medicine? They need to know if you have any of these conditions: -an unusual or allergic reaction to evolocumab, other medicines, foods, dyes, or preservatives -pregnant or trying to get pregnant -breast-feeding How should I use this medicine? This medicine is for injection under the skin. You will be taught how to prepare and give this medicine. Use exactly as directed. Take your medicine at regular intervals. Do not take your medicine more often than directed. It is important that you put your used needles and syringes in a special sharps container. Do not put them in a trash can. If you do not have a sharps container, call your pharmacist or health care provider to get one. Talk to your pediatrician regarding the use of this medicine in children. While this drug may be prescribed for children as young as 13 years for selected conditions, precautions do apply. Overdosage: If you think you have taken too much of this medicine contact a poison control center or emergency room at once. NOTE: This medicine is only for you. Do not share this medicine with others. What if I miss a dose?  If you miss a dose, take it as soon as you can if there are more than 7 days until the next scheduled dose, or skip the missed dose and take the next dose according to your original schedule. Do not take double or extra doses. What may interact with this  medicine? Interactions are not expected. This list may not describe all possible interactions. Give your health care provider a list of all the medicines, herbs, non-prescription drugs, or dietary supplements you use. Also tell them if you smoke, drink alcohol, or use illegal drugs. Some items may interact with your medicine. What should I watch for while using this medicine? You may need blood work while you are taking this medicine. What side effects may I notice from receiving this medicine? Side effects that you should report to your doctor or health care professional as soon as possible: -allergic reactions like skin rash, itching or hives, swelling of the face, lips, or tongue -signs and symptoms of infection like fever or chills; cough; sore throat; pain or trouble passing urine Side effects that usually do not require medical attention (report to your doctor or health care professional if they continue or are bothersome): -diarrhea -nausea -muscle pain -pain, redness, or irritation at site where injected This list may not describe all possible side effects. Call your doctor for medical advice about side effects. You may report side effects to FDA at 1-800-FDA-1088. Where should I keep my medicine? Keep out of the reach of children. You will be instructed on how to store this medicine. Throw away any unused medicine after the expiration date on the label. NOTE: This sheet is a summary. It may not cover all possible information. If you have questions about this medicine, talk to your doctor, pharmacist, or health care provider.  2018 Elsevier/Gold Standard (2016-02-25 13:21:53)    DASH Eating Plan DASH stands for "Dietary Approaches to Stop Hypertension." The DASH eating plan is a healthy eating plan that has been shown to reduce high blood pressure (hypertension). It may also reduce your risk for type 2 diabetes, heart disease, and stroke. The DASH eating plan may also help with  weight loss. What are tips for following this plan? General guidelines  Avoid eating more than 2,300 mg (milligrams) of salt (sodium) a day. If you have hypertension, you may need to reduce your sodium intake to 1,500 mg a day.  Limit alcohol intake to no more than 1 drink a day for nonpregnant women and 2 drinks a day for men. One drink equals 12 oz of beer, 5 oz of wine, or 1 oz of hard liquor.  Work with your health care provider to maintain a healthy body weight or to lose weight. Ask what an ideal weight is for you.  Get at least 30 minutes of exercise that causes your heart to beat faster (aerobic exercise) most days of the week. Activities may include walking, swimming, or biking.  Work with your health care provider or diet and nutrition specialist (dietitian) to adjust your eating plan to your individual calorie needs. Reading food labels  Check food labels for the amount of sodium per serving. Choose foods with less than 5 percent of the Daily Value of sodium. Generally, foods with less than 300 mg of sodium per serving fit into this eating plan.  To find whole grains, look for the word "whole" as the first word in the ingredient list. Shopping  Buy products labeled as "low-sodium" or "no salt added."  Buy fresh foods. Avoid canned foods and premade or frozen meals. Cooking  Avoid adding salt when cooking. Use salt-free seasonings or herbs instead of table salt or sea salt. Check with your health care provider or pharmacist before using salt substitutes.  Do not fry foods. Cook foods using healthy methods such as baking, boiling, grilling, and broiling instead.  Cook with heart-healthy oils, such as olive, canola, soybean, or sunflower oil. Meal planning   Eat a balanced diet that includes: ? 5 or more servings of fruits and vegetables each day. At each meal, try to fill half of your plate with fruits and vegetables. ? Up to 6-8 servings of whole grains each day. ? Less  than 6 oz of lean meat, poultry, or fish each day. A 3-oz serving of meat is about the same size as a deck of cards. One egg equals 1 oz. ? 2 servings of low-fat dairy each day. ? A serving of nuts, seeds, or beans 5 times each week. ? Heart-healthy fats. Healthy fats called Omega-3 fatty acids are found in foods such as flaxseeds and coldwater fish, like sardines, salmon, and mackerel.  Limit how much you eat of the following: ? Canned or prepackaged foods. ? Food that is high in trans fat, such as fried foods. ? Food that is high in saturated fat, such as fatty meat. ? Sweets, desserts, sugary drinks, and other foods with added sugar. ? Full-fat dairy products.  Do not salt foods before eating.  Try to eat at least 2 vegetarian meals each week.  Eat more home-cooked food and less restaurant, buffet, and fast food.  When eating at a restaurant, ask that your food be prepared with less salt or no salt, if possible. What foods are recommended? The items listed may not be a complete list. Talk with your dietitian about what dietary choices are best for you. Grains Whole-grain or whole-wheat bread. Whole-grain or whole-wheat pasta. Brown rice. [place]. Bulgur. Whole-grain and low-sodium cereals. Pita bread. Low-fat, low-sodium crackers. Whole-wheat flour tortillas. Vegetables Fresh or frozen vegetables (raw, steamed, roasted, or grilled). Low-sodium or reduced-sodium tomato and vegetable juice. Low-sodium or reduced-sodium tomato sauce and tomato paste. Low-sodium or reduced-sodium canned vegetables. Fruits All fresh, dried, or frozen fruit. Canned fruit in natural juice (without added sugar). Meat and other protein foods Skinless chicken or [place]. Ground chicken or [place]. Pork with fat trimmed off. Fish and seafood. Egg whites. Dried beans, peas, or lentils. Unsalted nuts, nut butters, and seeds. Unsalted canned beans. Lean cuts of beef with fat trimmed off. Low-sodium, lean deli  meat. Dairy Low-fat (1%) or fat-free (skim) milk. Fat-free, low-fat, or reduced-fat cheeses. Nonfat, low-sodium ricotta or cottage cheese. Low-fat or nonfat yogurt. Low-fat, low-sodium cheese. Fats and oils Soft margarine without trans fats. Vegetable oil. Low-fat, reduced-fat, or light mayonnaise and salad dressings (reduced-sodium). Canola, safflower, olive, soybean, and sunflower oils. Avocado. Seasoning and other foods Herbs. Spices. Seasoning mixes without salt. Unsalted popcorn and pretzels. Fat-free sweets. What foods are not recommended? The items listed may not be a complete list. Talk with your dietitian about what dietary choices are best for you. Grains Baked goods made with fat, such as croissants, muffins, or some breads. Dry pasta or rice meal packs. Vegetables Creamed or fried vegetables. Vegetables in a cheese sauce. Regular canned vegetables (not low-sodium or reduced-sodium). Regular canned tomato sauce and paste (not low-sodium or reduced-sodium). Regular tomato and vegetable juice (not low-sodium or reduced-sodium). Angie Fava. Olives. Fruits Canned fruit in  a light or heavy syrup. Fried fruit. Fruit in cream or butter sauce. Meat and other protein foods Fatty cuts of meat. Ribs. Fried meat. Berniece Salines. Sausage. Bologna and other processed lunch meats. Salami. Fatback. Hotdogs. Bratwurst. Salted nuts and seeds. Canned beans with added salt. Canned or smoked fish. Whole eggs or egg yolks. Chicken or [place] with skin. Dairy Whole or 2% milk, cream, and half-and-half. Whole or full-fat cream cheese. Whole-fat or sweetened yogurt. Full-fat cheese. Nondairy creamers. Whipped toppings. Processed cheese and cheese spreads. Fats and oils Butter. Stick margarine. Lard. Shortening. Ghee. Bacon fat. Tropical oils, such as coconut, palm kernel, or palm oil. Seasoning and other foods Salted popcorn and pretzels. Onion salt, garlic salt, seasoned salt, table salt, and sea salt. Worcestershire  sauce. Tartar sauce. Barbecue sauce. Teriyaki sauce. Soy sauce, including reduced-sodium. Steak sauce. Canned and packaged gravies. Fish sauce. Oyster sauce. Cocktail sauce. Horseradish that you find on the shelf. Ketchup. Mustard. Meat flavorings and tenderizers. Bouillon cubes. Hot sauce and Tabasco sauce. Premade or packaged marinades. Premade or packaged taco seasonings. Relishes. Regular salad dressings. Where to find more information:  National Heart, Lung, and [place]: https://wilson-eaton.com/  American Heart Association: www.heart.org Summary  The DASH eating plan is a healthy eating plan that has been shown to reduce high blood pressure (hypertension). It may also reduce your risk for type 2 diabetes, heart disease, and stroke.  With the DASH eating plan, you should limit salt (sodium) intake to 2,300 mg a day. If you have hypertension, you may need to reduce your sodium intake to 1,500 mg a day.  When on the DASH eating plan, aim to eat more fresh fruits and vegetables, whole grains, lean proteins, low-fat dairy, and heart-healthy fats.  Work with your health care provider or diet and nutrition specialist (dietitian) to adjust your eating plan to your individual calorie needs. This information is not intended to replace advice given to you by your health care provider. Make sure you discuss any questions you have with your health care provider. Document Released: 02/27/2011 Document Revised: 03/03/2016 Document Reviewed: 03/03/2016 Elsevier Interactive Patient Education  [employer].   If you need a refill on your cardiac medications before your next appointment, please call your pharmacy.       Signed, Daune Perch, NP  11/24/2017 12:47 PM    [facility]

## 2017-11-25 ENCOUNTER — Telehealth: Payer: Self-pay | Admitting: *Deleted

## 2017-11-25 LAB — BASIC METABOLIC PANEL
BUN / CREAT RATIO: 16 (ref 10–24)
BUN: 16 mg/dL (ref 8–27)
CHLORIDE: 98 mmol/L (ref 96–106)
CO2: 24 mmol/L (ref 20–29)
Calcium: 9.5 mg/dL (ref 8.6–10.2)
Creatinine, Ser: 1 mg/dL (ref 0.76–1.27)
GFR calc Af Amer: 93 mL/min/{1.73_m2} (ref >59)
GFR calc non Af Amer: 80 mL/min/{1.73_m2} (ref >59)
GLUCOSE: 97 mg/dL (ref 65–99)
Potassium: 4.7 mmol/L (ref 3.5–5.2)
SODIUM: 142 mmol/L (ref 134–144)

## 2017-11-25 NOTE — Telephone Encounter (Signed)
-----   Message from [name], [place] sent at 11/24/2017 12:31 PM EDT ----- Pt has been referred for a sleep study [name] [phone]  Dx: Snoring

## 2017-11-25 NOTE — Telephone Encounter (Signed)
-----   Message from [name], [place] sent at 11/24/2017 12:31 PM EDT ----- Pt has been referred for a sleep study [name] [phone]  Dx: Snoring

## 2017-11-25 NOTE — Telephone Encounter (Signed)
PA request for in lab split night sleep study submitted to [facility] via web portal.

## 2017-11-26 ENCOUNTER — Other Ambulatory Visit: Payer: Self-pay | Admitting: Cardiovascular Disease

## 2017-11-30 ENCOUNTER — Other Ambulatory Visit: Payer: Self-pay | Admitting: Cardiovascular Disease

## 2017-12-07 ENCOUNTER — Telehealth: Payer: Self-pay | Admitting: *Deleted

## 2017-12-07 NOTE — Telephone Encounter (Signed)
-----   Message from [name], [place] sent at 11/24/2017 12:31 PM EDT ----- Pt has been referred for a sleep study Shine Mikes [phone]  Dx: Snoring

## 2017-12-07 NOTE — Telephone Encounter (Signed)
Staff message sent to [facility] received. Ok to schedule in lab spilt night sleep study. Auth # [id number] valid 11/25/17 to 02/24/18.

## 2017-12-08 ENCOUNTER — Telehealth: Payer: Self-pay | Admitting: *Deleted

## 2017-12-08 NOTE — Telephone Encounter (Signed)
Opened in error

## 2017-12-09 ENCOUNTER — Telehealth: Payer: Self-pay | Admitting: *Deleted

## 2017-12-09 NOTE — Telephone Encounter (Signed)
    Patient is scheduled for lab study on 12/23/17. Patient understands his sleep study will be done at [place] lab. Patient understands he will receive a sleep packet in a week or so. Patient understands to call if he does not receive the sleep packet in a timely manner. Patient agrees with treatment and thanked me for call.

## 2017-12-09 NOTE — Telephone Encounter (Addendum)
Patient is scheduled for lab study on 12/23/17. Patient understands his sleep study will be done at [facility] lab. Patient understands he will receive a sleep packet in a week or so. Patient understands to call if he does not receive the sleep packet in a timely manner. Patient agrees with treatment and thanked me for call.

## 2017-12-09 NOTE — Telephone Encounter (Signed)
-----   Message from Lauralee Evener, [place] sent at 12/07/2017  4:28 PM EDT ----- [facility] approved  In lab sleep study. Ok to schedule. Josem Kaufmann # [id number]  VALID DATES 11/25/17 to/02/24/18. ----- Message ----- From: [name], CMA Sent: 11/24/2017  12:31 PM EDT To: Freada Bergeron, CMA, [facility] Sleep Studies  Pt has been referred for a sleep study Jeter Tomey [phone]  Dx: Snoring

## 2017-12-16 DIAGNOSIS — M25561 Pain in right knee: Secondary | ICD-10-CM | POA: Diagnosis not present

## 2017-12-24 ENCOUNTER — Telehealth: Payer: Self-pay | Admitting: Pharmacist

## 2017-12-24 ENCOUNTER — Ambulatory Visit (INDEPENDENT_AMBULATORY_CARE_PROVIDER_SITE_OTHER): Payer: 59 | Admitting: Pharmacist

## 2017-12-24 DIAGNOSIS — E7800 Pure hypercholesterolemia, unspecified: Secondary | ICD-10-CM

## 2017-12-24 MED ORDER — ROSUVASTATIN CALCIUM 40 MG PO TABS: 40.0000 mg | ORAL_TABLET | Freq: Every day | ORAL | 3 refills | Status: DC

## 2017-12-24 MED ORDER — EVOLOCUMAB 140 MG/ML ~~LOC~~ SOAJ: 1.0000 "pen " | SUBCUTANEOUS | 11 refills | Status: DC

## 2017-12-24 NOTE — Telephone Encounter (Signed)
Received notification from patient's insurance that PA was not required. [facility] Pt has $5 copay card to bring with him. LMOM for pt.

## 2017-12-24 NOTE — Progress Notes (Signed)
Patient ID: [name]                 DOB: 12/10/1955                    MRN: [id number]     HPI: [name] is a 63 y.o. male patient of Dr [name] referred to lipid clinic by Nathan Ades, NP. PMH is significant for CAD s/p CABG in 2006 and PCI in 2015, HTN, HLD, angina, obesity, and GERD. He presented to the office 11/06/17 with c/o exertional angina. Pt was referred for cardiac cath which revealed diffusely diseased mid and distal LAD and total occlusion of SVG to diagonal and RCA branches. He underwent balloon angioplasty of prox circ, unable to stent. Stenosis reduced from 75% to 25%.    Pt presents today in good spirits. He has been taking atorvastatin 80mg  daily and ezetimibe 10mg  daily for many years. He has noticed some joint pain in his knees with walking over the past year. He tries to stay active with walking and has made many dietary improvements since his recent stent was placed.  Current Medications: atorvastatin 80mg  daily, ezetimibe 10mg  daily  Risk Factors: CAD s/p CABG and PCI  LDL goal: 70mg /dL  Diet: Stopped eating bread, sweets, and soda. No fried food or fast food. Does eat red meat 2-3x a week.  Exercise: Walking 2 miles per day - approximately 30 minutes.  Family History: Non-contributory  Social History: Denies tobacco, alcohol, and illicit drug use.  Labs: 11/04/17: TC 188, TG 151, HDL 40, LDL 118 (atorvastatin 80mg  daily, ezetimibe 10mg  daily) 09/08/16: TC 132, TG 124, HDL 45, LDL 62 (atorvastatin 80mg  daily, ezetimibe 10mg  daily)  Past Medical History:  Diagnosis Date  . Arthritis   . CAD (coronary artery disease) 2000   a. History of MI ~2000 s/p PCI. b. CABG in 2006 (LIMA-LAD, RIMA-acute marginal, SVG-DX, SVG-RCA). c. NSTEMI 05/2013:severe stenoses of the SVG-PDA and SVG-diagonal, both treated successfully with PCI (DES x2).  Marland Kitchen GERD (gastroesophageal reflux disease)   . Gout   . History of hypertension   . Hydrocele 2012  . Hyperlipidemia     . Hypertension   . Myocardial infarction ([name]) 2014   x3  . Obesity   . S/P coronary angioplasty 11/10/17 11/11/2017    Current Outpatient Medications on File Prior to Visit  Medication Sig Dispense Refill  . acetaminophen (TYLENOL) 325 MG tablet Take 2 tablets (650 mg total) by mouth every 4 (four) hours as needed for headache or mild pain.    Marland Kitchen aspirin EC 81 MG tablet Take 81 mg by mouth daily.    Marland Kitchen atorvastatin (LIPITOR) 80 MG tablet TAKE 1 TABLET BY MOUTH EVERY DAY 30 tablet 11  . clopidogrel (PLAVIX) 75 MG tablet TAKE 1 TABLET (75 MG TOTAL) BY MOUTH DAILY. PLEASE KEEP UPCOMING APPT FOR FUTURE REFILLS. THANK YOU 90 tablet 3  . colchicine 0.6 MG tablet Take 0.6 mg by mouth daily as needed (gout flares).   3  . ezetimibe (ZETIA) 10 MG tablet TAKE 1 TABLET BY MOUTH EVERY DAY 30 tablet 4  . isosorbide mononitrate (IMDUR) 60 MG 24 hr tablet Take 1 tablet (60 mg total) by mouth daily. 30 tablet 11  . metoprolol tartrate (LOPRESSOR) 25 MG tablet TAKE 1 TABLET BY MOUTH TWICE A DAY 180 tablet 3  . nitroGLYCERIN (NITROSTAT) 0.4 MG SL tablet PLACE 1 TABLET UNDER THE TONGUE EVERY 5 MINUTES AS NEEDED FOR CHEST PAIN. (  Patient taking differently: Place 0.4 mg under the tongue every 5 (five) minutes as needed for chest pain. ) 25 tablet 0   No current facility-administered medications on file prior to visit.     Allergies  Allergen Reactions  . Hydrocodone Nausea Only    Assessment/Plan:  1. Hyperlipidemia - LDL 118 above goal < 70 due to hx of ASCVD on optimal lipid lowering therapy including atorvastatin 80mg  daily and ezetimibe 10mg  daily. Will stop atorvastatin and start equivalent dose of rosuvastatin 40mg  daily to see if this helps improve his knee pain. Will also start paperwork process for Repatha injections. Pt was provided with copay card in clinic today.   Zhania Shaheen E. Mariadelcarmen Corella, PharmD, BCACP, [facility] [address] [place], [place] [address] Phone: [phone]; Fax: [phone] 12/24/2017 11:16 AM

## 2017-12-24 NOTE — Patient Instructions (Addendum)
Stop taking atorvastatin and start taking rosuvastatin 40mg  once a day  Continue taking ezetimibe 10mg  once a day  I will start the paperwork process with your insurance for Repatha coverage and will let you know once it's approved  Look for [employer] Triple Zero yogurt  We will recheck your cholesterol after you have done 3 of the Repatha shots  Call Jinny Blossom, Pharmacist with any questions [phone]

## 2017-12-28 ENCOUNTER — Other Ambulatory Visit: Payer: Self-pay | Admitting: Cardiovascular Disease

## 2018-01-05 ENCOUNTER — Other Ambulatory Visit: Payer: Self-pay | Admitting: Pharmacist

## 2018-01-05 MED ORDER — EVOLOCUMAB 140 MG/ML ~~LOC~~ SOAJ: 1.0000 "pen " | SUBCUTANEOUS | 11 refills | Status: DC

## 2018-01-05 NOTE — Telephone Encounter (Signed)
[facility]. Rx sent to [facility] who has Repatha in stock.

## 2018-01-05 NOTE — Telephone Encounter (Signed)
Pt picked up Lavon and gave first injection successfully. Scheduled f/u lab work in 6 weeks to assess efficacy.

## 2018-01-05 NOTE — Addendum Note (Signed)
Addended by: SUPPLE, MEGAN E on: 01/05/2018 04:31 PM   Modules accepted: Orders

## 2018-02-09 DIAGNOSIS — I10 Essential (primary) hypertension: Secondary | ICD-10-CM | POA: Diagnosis not present

## 2018-02-09 DIAGNOSIS — Z1389 Encounter for screening for other disorder: Secondary | ICD-10-CM | POA: Diagnosis not present

## 2018-02-09 DIAGNOSIS — Z0001 Encounter for general adult medical examination with abnormal findings: Secondary | ICD-10-CM | POA: Diagnosis not present

## 2018-02-09 DIAGNOSIS — I2510 Atherosclerotic heart disease of native coronary artery without angina pectoris: Secondary | ICD-10-CM | POA: Diagnosis not present

## 2018-02-09 DIAGNOSIS — R7309 Other abnormal glucose: Secondary | ICD-10-CM | POA: Diagnosis not present

## 2018-02-09 DIAGNOSIS — Z23 Encounter for immunization: Secondary | ICD-10-CM | POA: Diagnosis not present

## 2018-02-09 DIAGNOSIS — E782 Mixed hyperlipidemia: Secondary | ICD-10-CM | POA: Diagnosis not present

## 2018-02-15 ENCOUNTER — Other Ambulatory Visit: Payer: 59 | Admitting: *Deleted

## 2018-02-15 DIAGNOSIS — E7800 Pure hypercholesterolemia, unspecified: Secondary | ICD-10-CM

## 2018-02-15 LAB — LIPID PANEL
CHOLESTEROL TOTAL: 84 mg/dL — AB (ref 100–199)
Chol/HDL Ratio: 1.7 ratio (ref 0.0–5.0)
HDL: 49 mg/dL (ref >39)
LDL CALC: 15 mg/dL (ref 0–99)
TRIGLYCERIDES: 98 mg/dL (ref 0–149)
VLDL Cholesterol Cal: 20 mg/dL (ref 5–40)

## 2018-02-15 LAB — HEPATIC FUNCTION PANEL
ALK PHOS: 94 IU/L (ref 39–117)
ALT: 30 IU/L (ref 0–44)
AST: 22 IU/L (ref 0–40)
Albumin: 4.4 g/dL (ref 3.6–4.8)
Bilirubin Total: 0.7 mg/dL (ref 0.0–1.2)
Bilirubin, Direct: 0.2 mg/dL (ref 0.00–0.40)
TOTAL PROTEIN: 6.3 g/dL (ref 6.0–8.5)

## 2018-05-17 ENCOUNTER — Encounter: Payer: Self-pay | Admitting: Cardiovascular Disease

## 2018-05-17 ENCOUNTER — Ambulatory Visit: Payer: 59 | Admitting: Cardiovascular Disease

## 2018-05-17 VITALS — BP 134/86 | HR 54 | Ht 69.0 in | Wt 269.0 lb

## 2018-05-17 DIAGNOSIS — I25119 Atherosclerotic heart disease of native coronary artery with unspecified angina pectoris: Secondary | ICD-10-CM | POA: Diagnosis not present

## 2018-05-17 DIAGNOSIS — E782 Mixed hyperlipidemia: Secondary | ICD-10-CM

## 2018-05-17 NOTE — Progress Notes (Signed)
Cardiology Office Note:    Date:  05/17/2018   ID:  [name], DOB 1956/01/21, MRN [id number]  PCP:  [facility]  Cardiologist:  [name], MD  Electrophysiologist:  None   Referring MD: [facility]   Chief Complaint  Patient presents with  . Chest Pain    History of Present Illness:    [name] is a 63 y.o. male with a hx of coronary artery disease, presenting for follow-up evaluation.  The patient underwent multivessel CABG in 2006 he underwent PCI of 2 vein grafts in 2015.  Other medical problems include hypertension, hyperlipidemia, and gastroesophageal reflux disease.  In 2019 he presented with progressive exertional angina. Cardiac cath demonstrated interval occlusion of the SVG-diag and SVG-RCA. The LIMA and RIMA grafts remained patent. Medical therapy has been recommended.   He is here alone today. He is able to do yard work, go up and down stairs, and do normal activities without too much trouble. He hasn't required NTG at all since last visit here. He has to pace himself or he might have chest discomfort. Denies shortness of breath. No leg swelling or other complaints.   Past Medical History:  Diagnosis Date  . Arthritis   . CAD (coronary artery disease) 2000   a. History of MI ~2000 s/p PCI. b. CABG in 2006 (LIMA-LAD, RIMA-acute marginal, SVG-DX, SVG-RCA). c. NSTEMI 05/2013:severe stenoses of the SVG-PDA and SVG-diagonal, both treated successfully with PCI (DES x2).  Marland Kitchen GERD (gastroesophageal reflux disease)   . Gout   . History of hypertension   . Hydrocele 2012  . Hyperlipidemia   . Hypertension   . Myocardial infarction ([place]) 2014   x3  . Obesity   . S/P coronary angioplasty 11/10/17 11/11/2017    Past Surgical History:  Procedure Laterality Date  . CATARACT EXTRACTION W/PHACO Left 05/17/2012   Procedure: CATARACT EXTRACTION PHACO AND INTRAOCULAR LENS PLACEMENT (IOC);  Surgeon: Tonny Branch, MD;  Location: [facility];   Service: Ophthalmology;  Laterality: Left;  CDE:  0.76  . CATARACT EXTRACTION W/PHACO Right 08/26/2012   Procedure: CATARACT EXTRACTION PHACO AND INTRAOCULAR LENS PLACEMENT (IOC);  Surgeon: Tonny Branch, MD;  Location: [facility];  Service: Ophthalmology;  Laterality: Right;  CDE: 1.13  . CORONARY ANGIOPLASTY  2000   stents x2  . CORONARY ARTERY BYPASS GRAFT  2006   4 vessels  . Coronary artery bypass grafting  May 2006   Four-vessel  . CORONARY BALLOON ANGIOPLASTY N/A 11/10/2017   Procedure: CORONARY BALLOON ANGIOPLASTY;  Surgeon: [name], MD;  Location: [place] [facility] [place] [facility];  Service: Cardiovascular;  Laterality: N/A;  . FOOT FRACTURE SURGERY     right  . LEFT HEART CATH AND CORS/GRAFTS ANGIOGRAPHY N/A 11/10/2017   Procedure: LEFT HEART CATH AND CORS/GRAFTS ANGIOGRAPHY;  Surgeon: [name], MD;  Location: [place] [facility];  Service: Cardiovascular;  Laterality: N/A;  . LEFT HEART CATHETERIZATION WITH CORONARY/GRAFT ANGIOGRAM N/A 05/05/2011   Procedure: LEFT HEART CATHETERIZATION WITH Beatrix Fetters;  Surgeon: Josue Hector, MD;  Location: [facility] LAB;  Service: Cardiovascular;  Laterality: N/A;  . LEFT HEART CATHETERIZATION WITH CORONARY/GRAFT ANGIOGRAM N/A 06/06/2013   Procedure: LEFT HEART CATHETERIZATION WITH Beatrix Fetters;  Surgeon: Blane Ohara, MD;  Location[facility] C[facility] LAB;  Service: Cardiovascular;  Laterality: N/A;  . MASS EXCISION Right 05/14/2016   Procedure: EXCISION MASS RIGHT ELBOW;  Surgeon: Carole Civil, MD;  Location: [facility];  Service: Orthopedics;  Laterality: Right;  .  PILONIDAL CYST EXCISION    . TONSILLECTOMY      Current Medications: Current Meds  Medication Sig  . acetaminophen (TYLENOL) 325 MG tablet Take 2 tablets (650 mg total) by mouth every 4 (four) hours as needed for headache or mild pain.  Marland Kitchen aspirin EC 81 MG tablet Take 81 mg by mouth daily.  . clopidogrel (PLAVIX) 75 MG tablet TAKE 1 TABLET (75 MG TOTAL) BY MOUTH DAILY.  PLEASE KEEP UPCOMING APPT FOR FUTURE REFILLS. THANK YOU  . colchicine 0.6 MG tablet Take 0.6 mg by mouth daily as needed (gout flares).   . Evolocumab (REPATHA SURECLICK) 607 MG/ML SOAJ Inject 1 pen into the skin every 14 (fourteen) days.  . isosorbide mononitrate (IMDUR) 60 MG 24 hr tablet Take 1 tablet (60 mg total) by mouth daily.  . metoprolol tartrate (LOPRESSOR) 25 MG tablet TAKE 1 TABLET BY MOUTH TWICE A DAY  . nitroGLYCERIN (NITROSTAT) 0.4 MG SL tablet PLACE 1 TABLET UNDER THE TONGUE EVERY 5 MINUTES AS NEEDED FOR CHEST PAIN.  . rosuvastatin (CRESTOR) 40 MG tablet Take 1 tablet (40 mg total) by mouth daily.     Allergies:   Hydrocodone   Social History   Socioeconomic History  . Marital status: Divorced    Spouse name: Not on file  . Number of children: Not on file  . Years of education: Not on file  . Highest education level: Not on file  Occupational History  . Not on file  Social Needs  . Financial resource strain: Not on file  . Food insecurity:    Worry: Not on file    Inability: Not on file  . Transportation needs:    Medical: Not on file    Non-medical: Not on file  Tobacco Use  . Smoking status: Never Smoker  . Smokeless tobacco: Never Used  Substance and Sexual Activity  . Alcohol use: No  . Drug use: No  . Sexual activity: Yes    Birth control/protection: None  Lifestyle  . Physical activity:    Days per week: Not on file    Minutes per session: Not on file  . Stress: Not on file  Relationships  . Social connections:    Talks on phone: Not on file    Gets together: Not on file    Attends religious service: Not on file    Active member of club or organization: Not on file    Attends meetings of clubs or organizations: Not on file    Relationship status: Not on file  Other Topics Concern  . Not on file  Social History Narrative  . Not on file     Family History: The patient's family history includes Cancer in his father and mother; Cervical  cancer in his mother; Healthy in his daughter and son; Throat cancer in his father; Thyroid disease in his son.  ROS:   Please see the history of present illness.    All other systems reviewed and are negative.  EKGs/Labs/Other Studies Reviewed:    The following studies were reviewed today: Cath 11-10-2017: Conclusion   1.  Severe native multivessel coronary artery disease with total occlusion of the RCA and total occlusion of the LAD, total occlusion of the first OM and severe stenosis of the proximal circumflex 2.  Status post aortocoronary bypass surgery with continued patency of the LIMA to LAD into a diffusely diseased mid and distal LAD, continued patency of the free RIMA to OM, and total occlusion of  the saphenous vein graft to diagonal and RCA branches 3.  Successful balloon angioplasty of the proximal circumflex, unable to deliver a stent, proximal circumflex stenosis reduced from 75% to 25% 4.  Normal LVEDP  Recommendations: Ongoing medical therapy and will increase antianginal program as tolerated.  Likely not a candidate for redo CABG because of poor conduits noted at initial surgery, prior use of both the RIMA and LIMA, and poor distal targets.  Recommend dual antiplatelet therapy with Aspirin 81mg  daily and Clopidogrel 75mg  daily long-term (beyond 12 months) because of extensive CAD, prior CABG, multiple PCI procedures.   Diagnostic  Dominance: Right    Intervention     EKG:  EKG is not ordered today.    Recent Labs: 11/11/2017: Hemoglobin 12.0; Platelets 223 11/24/2017: BUN 16; Creatinine, Ser 1.00; Potassium 4.7; Sodium 142 02/15/2018: ALT 30  Recent Lipid Panel    Component Value Date/Time   CHOL 84 (L) 02/15/2018 0957   TRIG 98 02/15/2018 0957   HDL 49 02/15/2018 0957   CHOLHDL 1.7 02/15/2018 0957   CHOLHDL 3.4 05/07/2015 0834   VLDL 16 05/07/2015 0834   LDLCALC 15 02/15/2018 0957    Physical Exam:    VS:  BP 134/86   Pulse (!) 54   Ht 5\' 9"  (1.753 m)    Wt 269 lb (122 kg)   SpO2 96%   BMI 39.72 kg/m     Wt Readings from Last 3 Encounters:  05/17/18 269 lb (122 kg)  11/24/17 261 lb 12.8 oz (118.8 kg)  11/11/17 257 lb 15 oz (117 kg)     GEN:  Well nourished, well developed in no acute distress HEENT: Normal NECK: No JVD; No carotid bruits LYMPHATICS: No lymphadenopathy CARDIAC: RRR, no murmurs, rubs, gallops RESPIRATORY:  Clear to auscultation without rales, wheezing or rhonchi  ABDOMEN: Soft, non-tender, non-distended MUSCULOSKELETAL:  No edema; No deformity  SKIN: Warm and dry NEUROLOGIC:  Alert and oriented x 3 PSYCHIATRIC:  Normal affect   ASSESSMENT:    1. Coronary artery disease involving native coronary artery of native heart with angina pectoris ([place])   2. Mixed hyperlipidemia    PLAN:    In order of problems listed above:  1. Reports CCS Class 2 angina, no interim NTG use. Med Rx reviewed and will be continued. 2. Reviewed most recent lipids which are excellent on PCSK9 Rx. He is tolerating injections well. Lengthy discussion regarding diet and exercise. We discussed increasing his walking program and eating more real foods with fruits and vegetables for snacks, carb reduction.    Medication Adjustments/Labs and Tests Ordered: Current medicines are reviewed at length with the patient today.  Concerns regarding medicines are outlined above.  No orders of the defined types were placed in this encounter.  No orders of the defined types were placed in this encounter.   Patient Instructions  Medication Instructions:  Your provider recommends that you continue on your current medications as directed. Please refer to the Current Medication list given to you today.    Labwork: None  Testing/Procedures: None  Follow-Up: Dr. Burt Knack recommends you return to see his assistant in 6 months.  Your provider wants you to follow-up in: 1 year with Dr. Burt Knack. You will receive a reminder letter in the mail two months  in advance. If you don't receive a letter, please call our office to schedule the follow-up appointment.      Signed, [name], MD  05/17/2018 12:18 PM    [facility] 

## 2018-05-17 NOTE — Patient Instructions (Signed)
Medication Instructions:  Your provider recommends that you continue on your current medications as directed. Please refer to the Current Medication list given to you today.    Labwork: None  Testing/Procedures: None  Follow-Up: Dr. Burt Knack recommends you return to see his assistant in 6 months.  Your provider wants you to follow-up in: 1 year with Dr. Burt Knack. You will receive a reminder letter in the mail two months in advance. If you don't receive a letter, please call our office to schedule the follow-up appointment.

## 2018-08-26 ENCOUNTER — Telehealth: Payer: Self-pay | Admitting: Pharmacist

## 2018-08-26 NOTE — Telephone Encounter (Signed)
Pt called clinic regarding Repatha - he received a letter from his insurance that they will no longer cover [place]. [facility] changed their formulary and are switching to Praluent coverage in July. He also reports some back pain, congestion, and wheezing and wonders if this could be from his Repatha. Advised that the back pain may be related, but that the congestion and wheezing likely are not. Advised him to try an antihistamine to see if this helps. If he is unable to get his last Repatha refill from the pharmacy, he will plan to do a PCSK9i washout for the month of June until his insurance will cover the Praluent - this will help [place] decide if his symptoms are related to the [place]. Will plan to start lower dose of Praluent 75mg  in July for better tolerability; LDL was 15 on Repatha.

## 2018-09-22 MED ORDER — PRALUENT 75 MG/ML ~~LOC~~ SOAJ: 1.0000 "pen " | SUBCUTANEOUS | 11 refills | Status: DC

## 2018-09-22 NOTE — Addendum Note (Signed)
Addended by: SUPPLE, MEGAN E on: 09/22/2018 10:18 AM   Modules accepted: Orders

## 2018-09-22 NOTE — Telephone Encounter (Addendum)
Praluent PA not needed, rx sent to pharmacy and pt made aware. Copay card activated for $0 copay and info provided to pharmacy.

## 2018-09-22 NOTE — Addendum Note (Signed)
Addended by: Naira Standiford E on: 09/22/2018 10:51 AM   Modules accepted: Orders

## 2018-10-11 ENCOUNTER — Other Ambulatory Visit: Payer: Self-pay | Admitting: Cardiology

## 2018-11-15 NOTE — Progress Notes (Signed)
Cardiology Office Note:    Date:  11/16/2018   ID:  [name], DOB [birth year], MRN [id number]  PCP:  [facility]  Cardiologist:  [name], MD  Electrophysiologist:  None   Referring MD: [facility]   Chief Complaint  Patient presents with  . Follow-up    CAD    History of Present Illness:    [name] is a 63 y.o. male with:  Coronary artery disease   s/p CABG in 2006  s/p NSTEMI >> DES to S-Dx and DES to [facility] in 2015  Cath 2019: S-Dx and S-RCA both occluded; PCI:  POBA to pLCx  Hypertension   Hyperlipidemia   GERD   [name] was last seen by Dr. Burt Knack in 04/2018.  He returns for follow-up.  He is here alone.  Since last seen, he has not had any chest discomfort.  He does note some shortness of breath for the first few days after taking his Alirocumab.  Otherwise, he has not had significant shortness of breath with exertion.  He has not had syncope or syncope, orthopnea, leg swelling.  Prior CV studies:   The following studies were reviewed today:  Cardiac catheterization 10/2017 LAD prox 100, mid 80, dist 90 LCx ost 75; OM1 100 RCA mid 100 L-mid LAD patent S-RCA 100 S-D1 100 RIMA-ost LAD/ost OM1 patent PCI:  POBA to prox LCx (75 >> 25) 1.  Severe native multivessel coronary artery disease with total occlusion of the RCA and total occlusion of the LAD, total occlusion of the first OM and severe stenosis of the proximal circumflex 2.  Status post aortocoronary bypass surgery with continued patency of the LIMA to LAD into a diffusely diseased mid and distal LAD, continued patency of the free RIMA to OM, and total occlusion of the saphenous vein graft to diagonal and RCA branches 3.  Successful balloon angioplasty of the proximal circumflex, unable to deliver a stent, proximal circumflex stenosis reduced from 75% to 25% 4.  Normal LVEDP  Recommendations: Ongoing medical therapy and will increase antianginal  program as tolerated.  Likely not a candidate for redo CABG because of poor conduits noted at initial surgery, prior use of both the RIMA and LIMA, and poor distal targets.    Echocardiogram 06/06/13 Mod LVH, EF 60-65, no RWMA  Echocardiogram 05/05/11 Mild LVH, EF 55, Gr 2 DD, mild BAE   Past Medical History:  Diagnosis Date  . Arthritis   . CAD (coronary artery disease) 2000   a. History of MI ~2000 s/p PCI. b. CABG in 2006 (LIMA-LAD, RIMA-acute marginal, SVG-DX, SVG-RCA). c. NSTEMI 05/2013:severe stenoses of the SVG-PDA and SVG-diagonal, both treated successfully with PCI (DES x2).  Marland Kitchen GERD (gastroesophageal reflux disease)   . Gout   . History of hypertension   . Hydrocele 2012  . Hyperlipidemia   . Hypertension   . Myocardial infarction ([place]) 2014   x3  . Obesity   . S/P coronary angioplasty 11/10/17 11/11/2017   Surgical Hx: The patient  has a past surgical history that includes Coronary artery bypass grafting (May 2006); Pilonidal cyst excision; Tonsillectomy; Foot fracture surgery; Cataract extraction w/PHACO (Left, 05/17/2012); Coronary artery bypass graft (2006); Cataract extraction w/PHACO (Right, 08/26/2012); left heart catheterization with coronary/graft angiogram (N/A, 05/05/2011); left heart catheterization with coronary/graft angiogram (N/A, 06/06/2013); Coronary angioplasty (2000); Mass excision (Right, 05/14/2016); LEFT HEART CATH AND CORS/GRAFTS ANGIOGRAPHY (N/A, 11/10/2017); and CORONARY BALLOON ANGIOPLASTY (N/A, 11/10/2017).   Current Medications: Current  Meds  Medication Sig  . acetaminophen (TYLENOL) 325 MG tablet Take 2 tablets (650 mg total) by mouth every 4 (four) hours as needed for headache or mild pain.  . Alirocumab (PRALUENT) 75 MG/ML SOAJ Inject 1 pen into the skin every 14 (fourteen) days.  Marland Kitchen aspirin EC 81 MG tablet Take 81 mg by mouth daily.  . clopidogrel (PLAVIX) 75 MG tablet TAKE 1 TABLET (75 MG TOTAL) BY MOUTH DAILY. PLEASE KEEP UPCOMING APPT FOR FUTURE  REFILLS. THANK YOU  . colchicine 0.6 MG tablet Take 0.6 mg by mouth daily as needed (gout flares).   . isosorbide mononitrate (IMDUR) 60 MG 24 hr tablet Take 1 tablet (60 mg total) by mouth daily. Please keep upcoming appt for future refills. Thank you.  . metoprolol tartrate (LOPRESSOR) 25 MG tablet TAKE 1 TABLET BY MOUTH TWICE A DAY  . nitroGLYCERIN (NITROSTAT) 0.4 MG SL tablet PLACE 1 TABLET UNDER THE TONGUE EVERY 5 MINUTES AS NEEDED FOR CHEST PAIN.  . rosuvastatin (CRESTOR) 40 MG tablet Take 1 tablet (40 mg total) by mouth daily.     Allergies:   Hydrocodone   Social History   Tobacco Use  . Smoking status: Never Smoker  . Smokeless tobacco: Never Used  Substance Use Topics  . Alcohol use: No  . Drug use: No     Family Hx: The patient's family history includes Cancer in his father and mother; Cervical cancer in his mother; Healthy in his daughter and son; Throat cancer in his father; Thyroid disease in his son.  ROS:   Please see the history of present illness.    ROS All other systems reviewed and are negative.   EKGs/Labs/Other Test Reviewed:    EKG:  EKG is  ordered today.  The ekg ordered today demonstrates sinus bradycardia, heart rate 51, normal axis, nonspecific ST-T wave changes, QTC 422, no significant change when compared to prior tracing  Recent Labs: 11/24/2017: BUN 16; Creatinine, Ser 1.00; Potassium 4.7; Sodium 142 02/15/2018: ALT 30   Recent Lipid Panel Lab Results  Component Value Date/Time   CHOL 84 (L) 02/15/2018 09:57 AM   TRIG 98 02/15/2018 09:57 AM   HDL 49 02/15/2018 09:57 AM   CHOLHDL 1.7 02/15/2018 09:57 AM   CHOLHDL 3.4 05/07/2015 08:34 AM   LDLCALC 15 02/15/2018 09:57 AM    Physical Exam:    VS:  BP 130/70   Pulse (!) 51   Ht 5\' 9"  (1.753 m)   Wt 268 lb 1.9 oz (121.6 kg)   BMI 39.59 kg/m     Wt Readings from Last 3 Encounters:  11/16/18 268 lb 1.9 oz (121.6 kg)  05/17/18 269 lb (122 kg)  11/24/17 261 lb 12.8 oz (118.8 kg)      Physical Exam  Constitutional: He is oriented to person, place, and time. He appears well-developed and well-nourished. No distress.  HENT:  Head: Normocephalic and atraumatic.  Eyes: No scleral icterus.  Neck: No JVD present. No thyromegaly present.  Cardiovascular: Normal rate, regular rhythm and normal heart sounds.  No murmur heard. Pulmonary/Chest: Effort normal and breath sounds normal. He has no rales.  Abdominal: Soft. There is no hepatomegaly.  Musculoskeletal:        General: No edema.  Lymphadenopathy:    He has no cervical adenopathy.  Neurological: He is alert and oriented to person, place, and time.  Skin: Skin is warm and dry.  Psychiatric: He has a normal mood and affect.    ASSESSMENT &  PLAN:    1. Coronary artery disease involving native coronary artery of native heart with angina pectoris ([place]) History of CABG in 2006 followed by myocardial infarction in 2015 treated with stent to the vein graft to the diagonal and drug-eluting stent to the vein graft to the RCA.  He underwent balloon angioplasty to the native LCx in 2019.  At that time, his vein graft to the diagonal and vein graft RCA were both occluded.  He is doing well without anginal symptoms.  Continue dual antiplatelet therapy with aspirin and clopidogrel, isosorbide, metoprolol tartrate and rosuvastatin.  2. Mixed hyperlipidemia Excellent control based upon his labs from November 2019.  Since then, he has been switched from evolocumab to Alirocumab.  He is fasting today.  Obtain follow-up CMET, lipids.  3. Essential hypertension The patient's blood pressure is controlled on his current regimen.  Continue current therapy.   4.  Morbid obesity We discussed different strategies for losing weight.   Dispo:  Return in about 6 months (around 05/19/2019) for Routine Follow Up, w/ Dr. Burt Knack, (virtual or in-person).   Medication Adjustments/Labs and Tests Ordered: Current medicines are reviewed at length with  the patient today.  Concerns regarding medicines are outlined above.  Tests Ordered: Orders Placed This Encounter  Procedures  . Comprehensive metabolic panel  . Lipid panel   Medication Changes: No orders of the defined types were placed in this encounter.   Signed, Richardson Dopp, PA-C  11/16/2018 10:10 AM    [facility] [address], [place], [place]  [address] Phone: [phone]; Fax: [phone]

## 2018-11-16 ENCOUNTER — Encounter: Payer: Self-pay | Admitting: Physician Assistant

## 2018-11-16 ENCOUNTER — Ambulatory Visit: Payer: 59 | Admitting: Physician Assistant

## 2018-11-16 ENCOUNTER — Other Ambulatory Visit: Payer: Self-pay

## 2018-11-16 VITALS — BP 130/70 | HR 51 | Ht 69.0 in | Wt 268.1 lb

## 2018-11-16 DIAGNOSIS — I10 Essential (primary) hypertension: Secondary | ICD-10-CM | POA: Diagnosis not present

## 2018-11-16 DIAGNOSIS — I25119 Atherosclerotic heart disease of native coronary artery with unspecified angina pectoris: Secondary | ICD-10-CM

## 2018-11-16 DIAGNOSIS — E782 Mixed hyperlipidemia: Secondary | ICD-10-CM | POA: Diagnosis not present

## 2018-11-16 LAB — COMPREHENSIVE METABOLIC PANEL
ALT: 20 IU/L (ref 0–44)
AST: 22 IU/L (ref 0–40)
Albumin/Globulin Ratio: 2.4 — ABNORMAL HIGH (ref 1.2–2.2)
Albumin: 4.7 g/dL (ref 3.8–4.8)
Alkaline Phosphatase: 84 IU/L (ref 39–117)
BUN/Creatinine Ratio: 10 (ref 10–24)
BUN: 10 mg/dL (ref 8–27)
Bilirubin Total: 0.8 mg/dL (ref 0.0–1.2)
CO2: 23 mmol/L (ref 20–29)
Calcium: 9.4 mg/dL (ref 8.6–10.2)
Chloride: 101 mmol/L (ref 96–106)
Creatinine, Ser: 1.04 mg/dL (ref 0.76–1.27)
GFR calc Af Amer: 88 mL/min/{1.73_m2} (ref >59)
GFR calc non Af Amer: 76 mL/min/{1.73_m2} (ref >59)
Globulin, Total: 2 g/dL (ref 1.5–4.5)
Glucose: 102 mg/dL — ABNORMAL HIGH (ref 65–99)
Potassium: 4.8 mmol/L (ref 3.5–5.2)
Sodium: 140 mmol/L (ref 134–144)
Total Protein: 6.7 g/dL (ref 6.0–8.5)

## 2018-11-16 LAB — LIPID PANEL
Chol/HDL Ratio: 2 ratio (ref 0.0–5.0)
Cholesterol, Total: 98 mg/dL — ABNORMAL LOW (ref 100–199)
HDL: 48 mg/dL (ref >39)
LDL Calculated: 31 mg/dL (ref 0–99)
Triglycerides: 97 mg/dL (ref 0–149)
VLDL Cholesterol Cal: 19 mg/dL (ref 5–40)

## 2018-11-16 NOTE — Patient Instructions (Signed)
Medication Instructions:  Your physician recommends that you continue on your current medications as directed. Please refer to the Current Medication list given to you today.  If you need a refill on your cardiac medications before your next appointment, please call your pharmacy.   Lab work: TODAY: LIPIDS, CMET  If you have labs (blood work) drawn today and your tests are completely normal, you will receive your results only by: Marland Kitchen MyChart Message (if you have MyChart) OR . A paper copy in the mail If you have any lab test that is abnormal or we need to change your treatment, we will call you to review the results.  Testing/Procedures: NONE  Follow-Up: At [facility], you and your health needs are our priority.  As part of our continuing mission to provide you with exceptional heart care, we have created designated Provider Care Teams.  These Care Teams include your primary Cardiologist (physician) and Advanced Practice Providers (APPs -  Physician Assistants and Nurse Practitioners) who all work together to provide you with the care you need, when you need it. You will need a follow up appointment in:  6 months.  Please call our office 2 months in advance to schedule this appointment.  You may see [name], MD or one of the following Advanced Practice Providers on your designated Care Team: Richardson Dopp, PA-C [place], [place] . Daune Perch, NP  Any Other Special Instructions Will Be Listed Below (If Applicable).

## 2018-11-24 ENCOUNTER — Other Ambulatory Visit: Payer: Self-pay | Admitting: Cardiovascular Disease

## 2018-11-25 ENCOUNTER — Other Ambulatory Visit: Payer: Self-pay | Admitting: Cardiovascular Disease

## 2019-01-05 ENCOUNTER — Other Ambulatory Visit: Payer: Self-pay | Admitting: Cardiology

## 2019-01-05 ENCOUNTER — Other Ambulatory Visit: Payer: Self-pay | Admitting: Cardiovascular Disease

## 2019-05-20 ENCOUNTER — Ambulatory Visit: Payer: 59 | Attending: Internal Medicine

## 2019-05-20 DIAGNOSIS — Z23 Encounter for immunization: Secondary | ICD-10-CM | POA: Insufficient documentation

## 2019-05-20 NOTE — Progress Notes (Signed)
   Covid-19 Vaccination Clinic  Name:  [name]    MRN: [id number] DOB: March 12, 1956  05/20/2019  Mr. Buonocore was observed post Covid-19 immunization for 15 minutes without incidence. He was provided with Vaccine Information Sheet and instruction to access the V-Safe system.   Mr. Imbert was instructed to call 911 with any severe reactions post vaccine: Marland Kitchen Difficulty breathing  . Swelling of your face and throat  . A fast heartbeat  . A bad rash all over your body  . Dizziness and weakness    Immunizations Administered    Name Date Dose VIS Date Route   Moderna COVID-19 Vaccine 05/20/2019  1:08 PM 0.5 mL 02/22/2019 Intramuscular   Manufacturer: Moderna   Lot: [id number]   [id number]

## 2019-06-09 ENCOUNTER — Encounter: Payer: Self-pay | Admitting: Cardiovascular Disease

## 2019-06-09 ENCOUNTER — Other Ambulatory Visit: Payer: Self-pay

## 2019-06-09 ENCOUNTER — Ambulatory Visit: Payer: 59 | Admitting: Cardiovascular Disease

## 2019-06-09 VITALS — BP 132/78 | HR 56 | Ht 69.0 in | Wt 282.0 lb

## 2019-06-09 DIAGNOSIS — I10 Essential (primary) hypertension: Secondary | ICD-10-CM | POA: Diagnosis not present

## 2019-06-09 DIAGNOSIS — E782 Mixed hyperlipidemia: Secondary | ICD-10-CM

## 2019-06-09 DIAGNOSIS — I25119 Atherosclerotic heart disease of native coronary artery with unspecified angina pectoris: Secondary | ICD-10-CM | POA: Diagnosis not present

## 2019-06-09 MED ORDER — ISOSORBIDE MONONITRATE ER 60 MG PO TB24: 90.0000 mg | ORAL_TABLET | Freq: Every day | ORAL | 3 refills | Status: DC

## 2019-06-09 NOTE — Progress Notes (Signed)
Cardiology Office Note:    Date:  06/09/2019   ID:  [name], DOB 1956-01-30, MRN [id number]  PCP:  [facility]  Cardiologist:  [name], MD  Electrophysiologist:  None   Referring MD: [facility]   Chief Complaint  Patient presents with  . Coronary Artery Disease    History of Present Illness:    [name] is a 64 y.o. male with a hx of:  Coronary artery disease  ? s/p CABG in 2006 ? s/p NSTEMI >> DES to S-Dx and DES to [facility] in 2015 ? Cath 2019: S-Dx and S-RCA both occluded; PCI:  POBA to pLCx  Hypertension   Hyperlipidemia   GERD  He is here alone today. He has predictable angina with about 10 minutes of walking. Occasionally he has angina with walking up a flight of steps if he goes at a brisk pace. He goes up and down the steps about 10 times/day for exercise. He has not required any NTG over the past year. He does admit to exertional dyspnea.  He denies leg swelling, orthopnea, PND, or heart palpitations.  He has had a lot of trouble with weight gain.  He has been less active, partly because of anginal symptoms.  Past Medical History:  Diagnosis Date  . Arthritis   . CAD (coronary artery disease) 2000   a. History of MI ~2000 s/p PCI. b. CABG in 2006 (LIMA-LAD, RIMA-acute marginal, SVG-DX, SVG-RCA). c. NSTEMI 05/2013:severe stenoses of the SVG-PDA and SVG-diagonal, both treated successfully with PCI (DES x2).  Marland Kitchen GERD (gastroesophageal reflux disease)   . Gout   . History of hypertension   . Hydrocele 2012  . Hyperlipidemia   . Hypertension   . Myocardial infarction ([place]) 2014   x3  . Obesity   . S/P coronary angioplasty 11/10/17 11/11/2017    Past Surgical History:  Procedure Laterality Date  . CATARACT EXTRACTION W/PHACO Left 05/17/2012   Procedure: CATARACT EXTRACTION PHACO AND INTRAOCULAR LENS PLACEMENT (IOC);  Surgeon: Tonny Branch, MD;  Location: [facility];  Service: Ophthalmology;  Laterality: Left;  CDE:   0.76  . CATARACT EXTRACTION W/PHACO Right 08/26/2012   Procedure: CATARACT EXTRACTION PHACO AND INTRAOCULAR LENS PLACEMENT (IOC);  Surgeon: Tonny Branch, MD;  Location: [facility];  Service: Ophthalmology;  Laterality: Right;  CDE: 1.13  . CORONARY ANGIOPLASTY  2000   stents x2  . CORONARY ARTERY BYPASS GRAFT  2006   4 vessels  . Coronary artery bypass grafting  May 2006   Four-vessel  . CORONARY BALLOON ANGIOPLASTY N/A 11/10/2017   Procedure: CORONARY BALLOON ANGIOPLASTY;  Surgeon: [name], MD;  Location: [place] [facility];  Service: Cardiovascular;  Laterality: N/A;  . FOOT FRACTURE SURGERY     right  . LEFT HEART CATH AND CORS/GRAFTS ANGIOGRAPHY N/A 11/10/2017   Procedure: LEFT HEART CATH AND CORS/GRAFTS ANGIOGRAPHY;  Surgeon: [name], MD;  Location: [place] [facility];  Service: Cardiovascular;  Laterality: N/A;  . LEFT HEART CATHETERIZATION WITH CORONARY/GRAFT ANGIOGRAM N/A 05/05/2011   Procedure: LEFT HEART CATHETERIZATION WITH Beatrix Fetters;  Surgeon: Josue Hector, MD;  Location: [facility] CATH LAB;  Service: Cardiovascular;  Laterality: N/A;  . LEFT HEART CATHETERIZATION WITH CORONARY/GRAFT ANGIOGRAM N/A 06/06/2013   Procedure: LEFT HEART CATHETERIZATION WITH Beatrix Fetters;  Surgeon: Blane Ohara, MD;  Location: [facility] CATH LAB;  Service: Cardiovascular;  Laterality: N/A;  . MASS EXCISION Right 05/14/2016   Procedure: EXCISION MASS RIGHT ELBOW;  Surgeon:  Carole Civil, MD;  Location: [facility];  Service: Orthopedics;  Laterality: Right;  . PILONIDAL CYST EXCISION    . TONSILLECTOMY      Current Medications: Current Meds  Medication Sig  . acetaminophen (TYLENOL) 325 MG tablet Take 2 tablets (650 mg total) by mouth every 4 (four) hours as needed for headache or mild pain.  . Alirocumab (PRALUENT) 75 MG/ML SOAJ Inject 1 pen into the skin every 14 (fourteen) days.  Marland Kitchen aspirin EC 81 MG tablet Take 81 mg by mouth daily.  . clopidogrel (PLAVIX) 75 MG tablet Take  1 tablet (75 mg total) by mouth daily.  . colchicine 0.6 MG tablet Take 0.6 mg by mouth daily as needed (gout flares).   . isosorbide mononitrate (IMDUR) 60 MG 24 hr tablet Take 1.5 tablets (90 mg total) by mouth daily.  . metoprolol tartrate (LOPRESSOR) 25 MG tablet TAKE 1 TABLET BY MOUTH TWICE A DAY  . nitroGLYCERIN (NITROSTAT) 0.4 MG SL tablet PLACE 1 TABLET UNDER THE TONGUE EVERY 5 MINUTES AS NEEDED FOR CHEST PAIN.  . rosuvastatin (CRESTOR) 40 MG tablet TAKE 1 TABLET BY MOUTH EVERY DAY  . [DISCONTINUED] isosorbide mononitrate (IMDUR) 60 MG 24 hr tablet Take 1 tablet (60 mg total) by mouth daily.     Allergies:   Hydrocodone   Social History   Socioeconomic History  . Marital status: Divorced    Spouse name: Not on file  . Number of children: Not on file  . Years of education: Not on file  . Highest education level: Not on file  Occupational History  . Not on file  Tobacco Use  . Smoking status: Never Smoker  . Smokeless tobacco: Never Used  Substance and Sexual Activity  . Alcohol use: No  . Drug use: No  . Sexual activity: Yes    Birth control/protection: None  Other Topics Concern  . Not on file  Social History Narrative  . Not on file   Social Determinants of Health   Financial Resource Strain:   . Difficulty of Paying Living Expenses:   Food Insecurity:   . Worried About Charity fundraiser in the Last Year:   . Arboriculturist in the Last Year:   Transportation Needs:   . Film/video editor (Medical):   Marland Kitchen Lack of Transportation (Non-Medical):   Physical Activity:   . Days of Exercise per Week:   . Minutes of Exercise per Session:   Stress:   . Feeling of Stress :   Social Connections:   . Frequency of Communication with Friends and Family:   . Frequency of Social Gatherings with Friends and Family:   . Attends Religious Services:   . Active Member of Clubs or Organizations:   . Attends Archivist Meetings:   Marland Kitchen Marital Status:       Family History: The patient's family history includes Cancer in his father and mother; Cervical cancer in his mother; Healthy in his daughter and son; Throat cancer in his father; Thyroid disease in his son.  ROS:   Please see the history of present illness.    All other systems reviewed and are negative.  EKGs/Labs/Other Studies Reviewed:    The following studies were reviewed today: Cardiac Catheterization 11-10-2017: Conclusion  1.  Severe native multivessel coronary artery disease with total occlusion of the RCA and total occlusion of the LAD, total occlusion of the first OM and severe stenosis of the proximal circumflex 2.  Status  post aortocoronary bypass surgery with continued patency of the LIMA to LAD into a diffusely diseased mid and distal LAD, continued patency of the free RIMA to OM, and total occlusion of the saphenous vein graft to diagonal and RCA branches 3.  Successful balloon angioplasty of the proximal circumflex, unable to deliver a stent, proximal circumflex stenosis reduced from 75% to 25% 4.  Normal LVEDP  Recommendations: Ongoing medical therapy and will increase antianginal program as tolerated.  Likely not a candidate for redo CABG because of poor conduits noted at initial surgery, prior use of both the RIMA and LIMA, and poor distal targets.  Recommend dual antiplatelet therapy with Aspirin 28m daily and Clopidogrel 762mdaily long-term (beyond 12 months) because of extensive CAD, prior CABG, multiple PCI procedures.   Diagnostic Dominance: Right Left Anterior Descending  Prox LAD lesion 100% stenosed  Prox LAD lesion is 100% stenosed.  Mid LAD lesion 80% stenosed  Mid LAD lesion is 80% stenosed.  Dist LAD lesion 90% stenosed  Dist LAD lesion is 90% stenosed. small, diffusely diseased vessel through the mid/distal portions.  Left Circumflex  Ost Cx to Prox Cx lesion 75% stenosed  Ost Cx to Prox Cx lesion is 75% stenosed. The lesion is eccentric.   First Obtuse Marginal Branch  Ost 1st Mrg lesion 100% stenosed  Ost 1st Mrg lesion is 100% stenosed.  Right Coronary Artery  Mid RCA lesion 100% stenosed  Mid RCA lesion is 100% stenosed.  First Right Posterolateral Branch  Collaterals  1st RPL filled by collaterals from Dist Cx.    LIMA LIMA Graft To Mid LAD  LIMA. Patent LIMA-LAD. Severe diffuse distal LAD stenosis.  Graft To Dist RCA  SVG-RCA occluded in proximal graft within stented segment  Origin to Prox Graft lesion 100% stenosed  Origin to Prox Graft lesion is 100% stenosed. The lesion was previously treated using a drug eluting stent over 2 years ago.  Graft To Ost 1st Diag  Origin to Prox Graft lesion 100% stenosed  Origin to Prox Graft lesion is 100% stenosed. proximal body of SVG-diagonal 100% occluded  Prox Graft to Mid Graft lesion 100% stenosed  Prox Graft to Mid Graft lesion is 100% stenosed. The lesion was previously treated using a drug eluting stent over 2 years ago.  Sequential RIMA Graft To Ost LAD, Ost 1st Mrg  RIMA. free RIMA-OM1 is widely patent  Intervention  Ost Cx to Prox Cx lesion  Angioplasty  [place] GUIDE 6FR XBLAD3.5 guide catheter was inserted. WIRE COUGAR XT STRL 190CM guidewire used to cross lesion. Balloon angioplasty was performed using a BALLOON SAPPHIRE [place] 2.5X12. Maximum pressure: 14 atm. Heparin is used for anticoagulation. Initially a 5 Fr[place]BU 3.5 cm guide is used. A cougar is advanced across the lesion. There is steep angulation from the lesion into the mid circumflex and this made wiring the lesion difficult. The lesion is predilated with a 2.5 x 15 mm balloon. I attempted to stent the lesion with a 3.0 x 18 mm resolute DES but this would not cross the lesion because of acute angulation. Guide catheter and wire position was lost. This was all changed out for a 6 Fr[place] LAD 3.5 cm guide catheter. The lesion is really wired with a cougar wire. The lesion still could not be crossed with  a stent. The lesion is then redilated with a 2.5 x 12 mm noncompliant balloon to 14 atm. Even with that, the lesion could not be crossed with the  stent in guiding wire position were again lost. Final angiography demonstrated an acceptable balloon angioplasty result with 20 to 25% residual stenosis and TIMI-3 flow. The patient is chest pain-free throughout. His ACT was therapeutic throughout greater than 250 seconds.  Post-Intervention Lesion Assessment  The intervention was successful. Pre-interventional TIMI flow is 3. Post-intervention TIMI flow is 3. No complications occurred at this lesion.  There is a 25% residual stenosis post intervention.  Coronary Diagrams  Diagnostic Dominance: Right  Intervention     EKG:  EKG is not ordered today.    Recent Labs: 11/16/2018: ALT 20; BUN 10; Creatinine, Ser 1.04; Potassium 4.8; Sodium 140  Recent Lipid Panel    Component Value Date/Time   CHOL 98 (L) 11/16/2018 1027   TRIG 97 11/16/2018 1027   HDL 48 11/16/2018 1027   CHOLHDL 2.0 11/16/2018 1027   CHOLHDL 3.4 05/07/2015 0834   VLDL 16 05/07/2015 0834   LDLCALC 31 11/16/2018 1027    Physical Exam:    VS:  BP 132/78   Pulse (!) 56   Ht _0  (1.753 m)   Wt 282 lb (127.9 kg)   SpO2 96%   BMI 41.64 kg/m     Wt Readings from Last 3 Encounters:  06/09/19 282 lb (127.9 kg)  11/16/18 268 lb 1.9 oz (121.6 kg)  05/17/18 269 lb (122 kg)     GEN: Pleasant, obese male in no acute distress HEENT: Normal NECK: No JVD; No carotid bruits LYMPHATICS: No lymphadenopathy CARDIAC: RRR, no murmurs, rubs, gallops RESPIRATORY:  Clear to auscultation without rales, wheezing or rhonchi  ABDOMEN: Soft, non-tender, non-distended MUSCULOSKELETAL:  No edema; No deformity  SKIN: Warm and dry NEUROLOGIC:  Alert and oriented x 3 PSYCHIATRIC:  Normal affect   ASSESSMENT:    1. Coronary artery disease involving native coronary artery of native heart with angina pectoris ([place])   2. Mixed  hyperlipidemia   3. Essential hypertension   4. Morbid obesity ([place])    PLAN:    In order of problems listed above:  1. CCS class II-III angina is present.  Will increase isosorbide to 90 mg daily.  We will check a Lexiscan Myoview stress test.  Otherwise continue current therapy.  He remains on long-term dual antiplatelet therapy in the setting of prior saphenous vein graft intervention. 2. Lipids are excellent with an LDL cholesterol of 31 on rosuvastatin and Praluent. 3. Blood pressure is well controlled on isosorbide and metoprolol. 4. Extensive discussion today on lifestyle modification with goals of weight loss.   Medication Adjustments/Labs and Tests Ordered: Current medicines are reviewed at length with the patient today.  Concerns regarding medicines are outlined above.  Orders Placed This Encounter  Procedures  . MYOCARDIAL PERFUSION IMAGING   Meds ordered this encounter  Medications  . isosorbide mononitrate (IMDUR) 60 MG 24 hr tablet    Sig: Take 1.5 tablets (90 mg total) by mouth daily.    Dispense:  135 tablet    Refill:  3    Patient Instructions  Medication Instructions:  1) INCREASE IMDUR to 90 mg daily *If you need a refill on your cardiac medications before your next appointment, please call your pharmacy*  Testing/Procedures: Your provider has requested that you have a lexiscan myoview. For further information please visit [email or web address]. Please follow instruction sheet, as given.  Follow-Up: At [facility], you and your health needs are our priority.  As part of our continuing mission to provide you with exceptional heart care, we have  created designated Provider Care Teams.  These Care Teams include your primary Cardiologist (physician) and Advanced Practice Providers (APPs -  Physician Assistants and Nurse Practitioners) who all work together to provide you with the care you need, when you need it. Your next appointment:   12 month(s) The format  for your next appointment:   In Person Provider:   You may see [name], MD or one of the following Advanced Practice Providers on your designated Care Team:    Richardson Dopp, PA-C  Robbie Lis, [place]      Signed, [name], MD  06/09/2019 1:06 PM    [place]

## 2019-06-09 NOTE — Patient Instructions (Signed)
Medication Instructions:  1) INCREASE IMDUR to 90 mg daily *If you need a refill on your cardiac medications before your next appointment, please call your pharmacy*  Testing/Procedures: Your provider has requested that you have a lexiscan myoview. For further information please visit [email or web address]. Please follow instruction sheet, as given.  Follow-Up: At [facility], you and your health needs are our priority.  As part of our continuing mission to provide you with exceptional heart care, we have created designated Provider Care Teams.  These Care Teams include your primary Cardiologist (physician) and Advanced Practice Providers (APPs -  Physician Assistants and Nurse Practitioners) who all work together to provide you with the care you need, when you need it. Your next appointment:   12 month(s) The format for your next appointment:   In Person Provider:   You may see [name], MD or one of the following Advanced Practice Providers on your designated Care Team:    Richardson Dopp, PA-C  Vin [place], [place]

## 2019-06-15 ENCOUNTER — Telehealth (HOSPITAL_COMMUNITY): Payer: Self-pay | Admitting: *Deleted

## 2019-06-15 NOTE — Telephone Encounter (Signed)
Patient given detailed instructions per Myocardial Perfusion Study Information Sheet for the test on 06/22/19. Patient notified to arrive 15 minutes early and that it is imperative to arrive on time for appointment to keep from having the test rescheduled.  If you need to cancel or reschedule your appointment, please call the office within 24 hours of your appointment. . Patient verbalized understanding. Kirstie Peri

## 2019-06-21 ENCOUNTER — Ambulatory Visit: Payer: 59 | Attending: Internal Medicine

## 2019-06-21 DIAGNOSIS — Z23 Encounter for immunization: Secondary | ICD-10-CM

## 2019-06-21 NOTE — Progress Notes (Signed)
   Covid-19 Vaccination Clinic  Name:  [name]    MRN: [id number] DOB: [birth year]  06/21/2019  [name] was observed post Covid-19 immunization for 15 minutes without incident. He was provided with Vaccine Information Sheet and instruction to access the V-Safe system.   [name] was instructed to call 911 with any severe reactions post vaccine: Marland Kitchen Difficulty breathing  . Swelling of face and throat  . A fast heartbeat  . A bad rash all over body  . Dizziness and weakness   Immunizations Administered    Name Date Dose VIS Date Route   Moderna COVID-19 Vaccine 06/21/2019 11:34 AM 0.5 mL 02/22/2019 Intramuscular   Manufacturer: Moderna   Lot: [id number]   [id number]

## 2019-06-22 ENCOUNTER — Ambulatory Visit (HOSPITAL_COMMUNITY): Payer: 59 | Attending: Internal Medicine

## 2019-06-22 ENCOUNTER — Other Ambulatory Visit: Payer: Self-pay

## 2019-06-22 DIAGNOSIS — I25119 Atherosclerotic heart disease of native coronary artery with unspecified angina pectoris: Secondary | ICD-10-CM | POA: Diagnosis not present

## 2019-06-22 MED ORDER — TECHNETIUM TC 99M TETROFOSMIN IV KIT
30.6000 | PACK | Freq: Once | INTRAVENOUS | Status: AC | PRN
  Administered 2019-06-22: 30.6 via INTRAVENOUS
  Filled 2019-06-22: qty 31

## 2019-06-22 MED ORDER — REGADENOSON 0.4 MG/5ML IV SOLN
0.4000 mg | Freq: Once | INTRAVENOUS | Status: AC
  Administered 2019-06-22: 0.4 mg via INTRAVENOUS

## 2019-06-23 ENCOUNTER — Ambulatory Visit (HOSPITAL_COMMUNITY): Payer: 59 | Attending: Cardiology

## 2019-06-23 LAB — MYOCARDIAL PERFUSION IMAGING
LV dias vol: 95 mL (ref 62–150)
LV sys vol: 38 mL
Peak HR: 69 {beats}/min
Rest HR: 54 {beats}/min
SDS: 6
SRS: 0
SSS: 6
TID: 1.04

## 2019-06-23 MED ORDER — TECHNETIUM TC 99M TETROFOSMIN IV KIT
31.6000 | PACK | Freq: Once | INTRAVENOUS | Status: AC | PRN
  Administered 2019-06-23: 31.6 via INTRAVENOUS
  Filled 2019-06-23: qty 32

## 2019-06-29 ENCOUNTER — Ambulatory Visit: Payer: 59

## 2019-08-21 ENCOUNTER — Other Ambulatory Visit: Payer: Self-pay | Admitting: Cardiovascular Disease

## 2019-09-25 ENCOUNTER — Other Ambulatory Visit: Payer: Self-pay | Admitting: Cardiovascular Disease

## 2019-10-05 ENCOUNTER — Telehealth: Payer: Self-pay | Admitting: Cardiovascular Disease

## 2019-10-05 MED ORDER — PRALUENT 75 MG/ML ~~LOC~~ SOAJ: SUBCUTANEOUS | 11 refills | Status: DC

## 2019-10-05 NOTE — Telephone Encounter (Signed)
Refill sent over for a 3rd time.

## 2019-10-05 NOTE — Telephone Encounter (Signed)
Refill was sent in yesterday. Called pt who states he spoke with pharmacy yesterday and they didn't have it. Called pharmacy who states they don't have rx on file despite our records showing rx was received. Sent in another rx, they need order med for tomorrow.

## 2019-10-05 NOTE — Telephone Encounter (Signed)
*  STAT* If patient is at the pharmacy, call can be transferred to refill team.   1. Which medications need to be refilled? (please list name of each medication and dose if known) PRALUENT 75 MG/ML SOAJ  2. Which pharmacy/location (including street and city if local pharmacy) is medication to be sent to? [id number] - [place], [address]  3. Do they need a 30 day or 90 day supply? 30 day   Patient is out of medication and due to take it today. He states the pharmacy has not received the refill.

## 2019-10-05 NOTE — Telephone Encounter (Signed)
Follow Up:   Need a new prescription for the Praluent Injection- Pharmacist said they accidentally deleted the prescription.

## 2019-12-04 ENCOUNTER — Other Ambulatory Visit: Payer: Self-pay | Admitting: Cardiovascular Disease

## 2020-03-23 DIAGNOSIS — Z23 Encounter for immunization: Secondary | ICD-10-CM | POA: Diagnosis not present

## 2020-05-10 DIAGNOSIS — H43813 Vitreous degeneration, bilateral: Secondary | ICD-10-CM | POA: Diagnosis not present

## 2020-05-16 ENCOUNTER — Encounter: Payer: Self-pay | Admitting: Pharmacist

## 2020-05-16 ENCOUNTER — Other Ambulatory Visit: Payer: Self-pay | Admitting: Pharmacist

## 2020-05-16 MED ORDER — PRALUENT 75 MG/ML ~~LOC~~ SOAJ: SUBCUTANEOUS | 11 refills | Status: DC

## 2020-05-16 NOTE — Telephone Encounter (Signed)
This encounter was created in error - please disregard.

## 2020-06-04 ENCOUNTER — Other Ambulatory Visit: Payer: Self-pay | Admitting: Cardiovascular Disease

## 2020-06-05 ENCOUNTER — Other Ambulatory Visit: Payer: Self-pay | Admitting: Cardiovascular Disease

## 2020-06-19 DIAGNOSIS — D225 Melanocytic nevi of trunk: Secondary | ICD-10-CM | POA: Diagnosis not present

## 2020-06-19 DIAGNOSIS — Z1283 Encounter for screening for malignant neoplasm of skin: Secondary | ICD-10-CM | POA: Diagnosis not present

## 2020-06-19 DIAGNOSIS — B078 Other viral warts: Secondary | ICD-10-CM | POA: Diagnosis not present

## 2020-06-19 DIAGNOSIS — D485 Neoplasm of uncertain behavior of skin: Secondary | ICD-10-CM | POA: Diagnosis not present

## 2020-06-27 ENCOUNTER — Ambulatory Visit (INDEPENDENT_AMBULATORY_CARE_PROVIDER_SITE_OTHER): Payer: Medicare Other | Admitting: Cardiovascular Disease

## 2020-06-27 ENCOUNTER — Encounter: Payer: Self-pay | Admitting: Cardiovascular Disease

## 2020-06-27 ENCOUNTER — Other Ambulatory Visit: Payer: Self-pay

## 2020-06-27 VITALS — BP 136/80 | HR 56 | Ht 69.0 in | Wt 275.4 lb

## 2020-06-27 DIAGNOSIS — I10 Essential (primary) hypertension: Secondary | ICD-10-CM | POA: Diagnosis not present

## 2020-06-27 DIAGNOSIS — I25119 Atherosclerotic heart disease of native coronary artery with unspecified angina pectoris: Secondary | ICD-10-CM | POA: Diagnosis not present

## 2020-06-27 DIAGNOSIS — I214 Non-ST elevation (NSTEMI) myocardial infarction: Secondary | ICD-10-CM

## 2020-06-27 DIAGNOSIS — E782 Mixed hyperlipidemia: Secondary | ICD-10-CM

## 2020-06-27 DIAGNOSIS — K760 Fatty (change of) liver, not elsewhere classified: Secondary | ICD-10-CM | POA: Diagnosis not present

## 2020-06-27 NOTE — Patient Instructions (Signed)
Medication Instructions:  Your provider recommends that you continue on your current medications as directed. Please refer to the Current Medication list given to you today.   *If you need a refill on your cardiac medications before your next appointment, please call your pharmacy*  Lab Work: TODAY! CBC, CMET, Lipids, TSH, A1C If you have labs (blood work) drawn today and your tests are completely normal, you will receive your results only by: Marland Kitchen MyChart Message (if you have MyChart) OR . A paper copy in the mail If you have any lab test that is abnormal or we need to change your treatment, we will call you to review the results.  Follow-Up: At [facility], you and your health needs are our priority.  As part of our continuing mission to provide you with exceptional heart care, we have created designated Provider Care Teams.  These Care Teams include your primary Cardiologist (physician) and Advanced Practice Providers (APPs -  Physician Assistants and Nurse Practitioners) who all work together to provide you with the care you need, when you need it. Your next appointment:   12 month(s) The format for your next appointment:   In Person Provider:   You may see [name], MD or one of the following Advanced Practice Providers on your designated Care Team:    Richardson Dopp, PA-C  [place]in [place], [place]

## 2020-06-27 NOTE — Progress Notes (Signed)
Cardiology Office Note:    Date:  06/27/2020   ID:  [name], DOB 1955/08/14, MRN [id number]  PCP:  [place]  Cardiologist:  [name], MD  Advanced Practice Provider:  No care team member to display Electrophysiologist:  None       Referring MD: [facility]   No chief complaint on file.   History of Present Illness:    [name] is a 65 y.o. male with a hx of:  Coronary artery disease ? s/p CABG in 2006 ? s/p NSTEMI >> DES to S-Dx and DES to [place] 2015 ? Cath 2019: S-Dx and S-RCA both occluded; PCI: POBA to pLCx  Hypertension   Hyperlipidemia   GERD  The patient is here alone today.  He has been doing fairly well.  He had COVID-19 last year and lost about 20 pounds during that time.  He has gained that weight back.  He is still getting out and doing yard work and denies exertional symptoms with that level of activity.  He does have some degree of shortness of breath with activity when walking upstairs.  He has occasional chest discomfort but appreciates no change in this over the last year.  His isosorbide was increased from 60 up to 90 mg last year but he could not tolerate the increased dose because of headache.  He has been back on the 60 mg dose since that time.  He has not required any sublingual nitroglycerin over the last year.  He denies orthopnea, PND, leg swelling, or heart palpitations.  Past Medical History:  Diagnosis Date  . Arthritis   . CAD (coronary artery disease) 2000   a. History of MI ~2000 s/p PCI. b. CABG in 2006 (LIMA-LAD, RIMA-acute marginal, SVG-DX, SVG-RCA). c. NSTEMI 05/2013:severe stenoses of the SVG-PDA and SVG-diagonal, both treated successfully with PCI (DES x2).  [place] GERD (gastroesophageal reflux disease)   . Gout   . History of hypertension   . Hydrocele 2012  . Hyperlipidemia   . Hypertension   . Myocardial infarction ([place]) 2014   x3  .  Obesity   . S/P coronary angioplasty 11/10/17 11/11/2017    Past Surgical History:  Procedure Laterality Date  . CATARACT EXTRACTION W/PHACO Left 05/17/2012   Procedure: CATARACT EXTRACTION PHACO AND INTRAOCULAR LENS PLACEMENT (IOC);  Surgeon: Tonny Branch, MD;  Location: [facility];  Service: Ophthalmology;  Laterality: Left;  CDE:  0.76  . CATARACT EXTRACTION W/PHACO Right 08/26/2012   Procedure: CATARACT EXTRACTION PHACO AND INTRAOCULAR LENS PLACEMENT (IOC);  Surgeon: Tonny Branch, MD;  Location: [facility];  Service: Ophthalmology;  Laterality: Right;  CDE: 1.13  . CORONARY ANGIOPLASTY  2000   stents x2  . CORONARY ARTERY BYPASS GRAFT  2006   4 vessels  . Coronary artery bypass grafting  May 2006   Four-vessel  . CORONARY BALLOON ANGIOPLASTY N/A 11/10/2017   Procedure: CORONARY BALLOON ANGIOPLASTY;  Surgeon: [name], MD;  Location: [place] [facility];  Service: Cardiovascular;  Laterality: N/A;  . FOOT FRACTURE SURGERY     right  . LEFT HEART CATH AND CORS/GRAFTS ANGIOGRAPHY N/A 11/10/2017   Procedure: LEFT HEART CATH AND CORS/GRAFTS ANGIOGRAPHY;  Surgeon: [name], MD;  Location: [place] [facility];  Service: Cardiovascular;  Laterality: N/A;  . LEFT HEART CATHETERIZATION WITH CORONARY/GRAFT ANGIOGRAM N/A 05/05/2011   Procedure: LEFT HEART CATHETERIZATION WITH Beatrix Fetters;  Surgeon: Josue Hector, MD;  Location: [place] CATH LAB;  Service: Cardiovascular;  Laterality: N/A;  . LEFT HEART CATHETERIZATION WITH CORONARY/GRAFT ANGIOGRAM N/A 06/06/2013   Procedure: LEFT HEART CATHETERIZATION WITH Beatrix Fetters;  Surgeon: Blane Ohara, MD;  Location: [facility] CATH [facility];  Service: Cardiovascular;  Laterality: N/A;  . MASS EXCISION Right 05/14/2016   Procedure: EXCISION MASS RIGHT ELBOW;  Surgeon: Carole Civil, MD;  Location: [facility];  Service: Orthopedics;  Laterality: Right;  . PILONIDAL CYST EXCISION    . TONSILLECTOMY      Current Medications: Current Meds  Medication  Sig  . Alirocumab (PRALUENT) 75 MG/ML SOAJ INJECT 1 PEN INTO THE SKIN EVERY 14 DAYS  . aspirin EC 81 MG tablet Take 81 mg by mouth daily.  . clopidogrel (PLAVIX) 75 MG tablet TAKE 1 TABLET BY MOUTH EVERY DAY  . colchicine 0.6 MG tablet Take 0.6 mg by mouth daily as needed (gout flares).   . isosorbide mononitrate (IMDUR) 60 MG 24 hr tablet Take 60 mg by mouth daily.  . metoprolol tartrate (LOPRESSOR) 25 MG tablet TAKE 1 TABLET BY MOUTH TWICE A DAY  . nitroGLYCERIN (NITROSTAT) 0.4 MG SL tablet PLACE 1 TABLET UNDER THE TONGUE EVERY 5 MINUTES AS NEEDED FOR CHEST PAIN.  . rosuvastatin (CRESTOR) 40 MG tablet TAKE 1 TABLET BY MOUTH EVERY DAY     Allergies:   Hydrocodone   Social History   Socioeconomic History  . Marital status: Divorced    Spouse name: Not on file  . Number of children: Not on file  . Years of education: Not on file  . Highest education level: Not on file  Occupational History  . Not on file  Tobacco Use  . Smoking status: Never Smoker  . Smokeless tobacco: Never Used  Vaping Use  . Vaping Use: Never used  Substance and Sexual Activity  . Alcohol use: No  . Drug use: No  . Sexual activity: Yes    Birth control/protection: None  Other Topics Concern  . Not on file  Social History Narrative  . Not on file   Social Determinants of Health   Financial Resource Strain: Not on file  Food Insecurity: Not on file  Transportation Needs: Not on file  Physical Activity: Not on file  Stress: Not on file  Social Connections: Not on file     Family History: The patient's family history includes Cancer in his father and mother; Cervical cancer in his mother; Healthy in his daughter and son; Throat cancer in his father; Thyroid disease in his son.  ROS:   Please see the history of present illness.    All other systems reviewed and are negative.  EKGs/Labs/Other Studies Reviewed:    The following studies were reviewed today: Myoview Scan 06/23/19:   The left  ventricular ejection fraction is normal (55-65%).  Nuclear stress EF: 60%.  There was no ST segment deviation noted during stress.  Findings consistent with ischemia.  This is an intermediate risk study.  No prior study for comparison.   Ischemia in mid anterior wall noted in both supine and upright stress images.   EKG:  EKG is ordered today.  The ekg ordered today demonstrates sinus bradycardia 56 bpm, possible age-indeterminate inferior MI, otherwise within normal limits.  Recent Labs: No results found for requested labs within last 8760 hours.  Recent Lipid Panel    Component Value Date/Time   CHOL 98 (L) 11/16/2018 1027   TRIG 97 11/16/2018 1027   HDL 48 11/16/2018 1027  CHOLHDL 2.0 11/16/2018 1027   CHOLHDL 3.4 05/07/2015 0834   VLDL 16 05/07/2015 0834   LDLCALC 31 11/16/2018 1027     Risk Assessment/Calculations:     Physical Exam:    VS:  BP 136/80   Pulse (!) 56   Ht 5\' 9"  (1.753 m)   Wt 275 lb 6.4 oz (124.9 kg)   SpO2 97%   BMI 40.67 kg/m     Wt Readings from Last 3 Encounters:  06/27/20 275 lb 6.4 oz (124.9 kg)  06/22/19 282 lb (127.9 kg)  06/09/19 282 lb (127.9 kg)     GEN:  Well nourished, well developed in no acute distress HEENT: Normal NECK: No JVD; No carotid bruits LYMPHATICS: No lymphadenopathy CARDIAC: RRR, no murmurs, rubs, gallops RESPIRATORY:  Clear to auscultation without rales, wheezing or rhonchi  ABDOMEN: Soft, non-tender, non-distended MUSCULOSKELETAL:  No edema; No deformity  SKIN: Warm and dry NEUROLOGIC:  Alert and oriented x 3 PSYCHIATRIC:  Normal affect   ASSESSMENT:    1. Coronary artery disease involving native coronary artery of native heart with angina pectoris ([place])   2. Mixed hyperlipidemia   3. Essential hypertension   4. Morbid obesity ([place])    PLAN:    In order of problems listed above:  1. Stable on current medical regimen.  Treated with aspirin and clopidogrel in the setting of bypass graft failure.   Angina is stable on metoprolol and isosorbide.  He is treated with a high intensity statin drug CCS functional class II symptoms.  Follow-up 1 year. 2. Treated with rosuvastatin and Praluent.  Check lipids and LFTs today.  Diet and lifestyle modification discussed at length. 3. Blood pressure is controlled on isosorbide and metoprolol. 4. Discussed weight loss measures.  Overall the patient is stable from a cardiac perspective.  His last labs were in 2020.  We will check a CBC, metabolic panel, lipid panel, TSH, and hemoglobin A1c today.    Medication Adjustments/Labs and Tests Ordered: Current medicines are reviewed at length with the patient today.  Concerns regarding medicines are outlined above.  No orders of the defined types were placed in this encounter.  No orders of the defined types were placed in this encounter.   There are no Patient Instructions on file for this visit.   Signed, [name], MD  06/27/2020 1:57 PM    [facility]

## 2020-06-28 LAB — COMPREHENSIVE METABOLIC PANEL
ALT: 21 IU/L (ref 0–44)
AST: 31 IU/L (ref 0–40)
Albumin/Globulin Ratio: 1.7 (ref 1.2–2.2)
Albumin: 4.3 g/dL (ref 3.8–4.8)
Alkaline Phosphatase: 96 IU/L (ref 44–121)
BUN/Creatinine Ratio: 8 — ABNORMAL LOW (ref 10–24)
BUN: 8 mg/dL (ref 8–27)
Bilirubin Total: 1.1 mg/dL (ref 0.0–1.2)
CO2: 24 mmol/L (ref 20–29)
Calcium: 9.4 mg/dL (ref 8.6–10.2)
Chloride: 97 mmol/L (ref 96–106)
Creatinine, Ser: 0.98 mg/dL (ref 0.76–1.27)
Globulin, Total: 2.6 g/dL (ref 1.5–4.5)
Glucose: 117 mg/dL — ABNORMAL HIGH (ref 65–99)
Potassium: 4.6 mmol/L (ref 3.5–5.2)
Sodium: 138 mmol/L (ref 134–144)
Total Protein: 6.9 g/dL (ref 6.0–8.5)
eGFR: 86 mL/min/{1.73_m2} (ref >59)

## 2020-06-28 LAB — CBC WITH DIFFERENTIAL/PLATELET
Basophils Absolute: 0 10*3/uL (ref 0.0–0.2)
Basos: 1 %
EOS (ABSOLUTE): 0.1 10*3/uL (ref 0.0–0.4)
Eos: 1 %
Hematocrit: 39.4 % (ref 37.5–51.0)
Hemoglobin: 13.2 g/dL (ref 13.0–17.7)
Immature Grans (Abs): 0 10*3/uL (ref 0.0–0.1)
Immature Granulocytes: 0 %
Lymphocytes Absolute: 2.2 10*3/uL (ref 0.7–3.1)
Lymphs: 32 %
MCH: 29.5 pg (ref 26.6–33.0)
MCHC: 33.5 g/dL (ref 31.5–35.7)
MCV: 88 fL (ref 79–97)
Monocytes Absolute: 1.1 10*3/uL — ABNORMAL HIGH (ref 0.1–0.9)
Monocytes: 16 %
Neutrophils Absolute: 3.5 10*3/uL (ref 1.4–7.0)
Neutrophils: 50 %
Platelets: 231 10*3/uL (ref 150–450)
RBC: 4.47 x10E6/uL (ref 4.14–5.80)
RDW: 12.9 % (ref 11.6–15.4)
WBC: 6.9 10*3/uL (ref 3.4–10.8)

## 2020-06-28 LAB — LIPID PANEL
Chol/HDL Ratio: 1.8 ratio (ref 0.0–5.0)
Cholesterol, Total: 85 mg/dL — ABNORMAL LOW (ref 100–199)
HDL: 47 mg/dL (ref >39)
LDL Chol Calc (NIH): 17 mg/dL (ref 0–99)
Triglycerides: 116 mg/dL (ref 0–149)
VLDL Cholesterol Cal: 21 mg/dL (ref 5–40)

## 2020-06-28 LAB — HEMOGLOBIN A1C
Est. average glucose Bld gHb Est-mCnc: 203 mg/dL
Hgb A1c MFr Bld: 8.7 % — ABNORMAL HIGH (ref 4.8–5.6)

## 2020-06-28 LAB — TSH: TSH: 2.27 u[IU]/mL (ref 0.450–4.500)

## 2020-07-31 ENCOUNTER — Other Ambulatory Visit: Payer: Self-pay | Admitting: Cardiovascular Disease

## 2020-09-30 ENCOUNTER — Other Ambulatory Visit: Payer: Self-pay | Admitting: Cardiovascular Disease

## 2020-09-30 DIAGNOSIS — I214 Non-ST elevation (NSTEMI) myocardial infarction: Secondary | ICD-10-CM

## 2020-09-30 DIAGNOSIS — I25119 Atherosclerotic heart disease of native coronary artery with unspecified angina pectoris: Secondary | ICD-10-CM

## 2020-09-30 DIAGNOSIS — E782 Mixed hyperlipidemia: Secondary | ICD-10-CM

## 2020-12-04 ENCOUNTER — Other Ambulatory Visit: Payer: Self-pay | Admitting: Cardiovascular Disease

## 2021-03-12 DIAGNOSIS — Z23 Encounter for immunization: Secondary | ICD-10-CM | POA: Diagnosis not present

## 2021-05-13 DIAGNOSIS — H43393 Other vitreous opacities, bilateral: Secondary | ICD-10-CM | POA: Diagnosis not present

## 2021-06-20 ENCOUNTER — Ambulatory Visit (INDEPENDENT_AMBULATORY_CARE_PROVIDER_SITE_OTHER): Payer: Medicare Other | Admitting: Family Medicine

## 2021-06-20 ENCOUNTER — Encounter: Payer: Self-pay | Admitting: Family Medicine

## 2021-06-20 VITALS — BP 140/72 | HR 57 | Temp 97.9°F | Ht 67.0 in | Wt 268.8 lb

## 2021-06-20 DIAGNOSIS — R350 Frequency of micturition: Secondary | ICD-10-CM | POA: Diagnosis not present

## 2021-06-20 DIAGNOSIS — Z125 Encounter for screening for malignant neoplasm of prostate: Secondary | ICD-10-CM

## 2021-06-20 DIAGNOSIS — I25119 Atherosclerotic heart disease of native coronary artery with unspecified angina pectoris: Secondary | ICD-10-CM | POA: Diagnosis not present

## 2021-06-20 DIAGNOSIS — E782 Mixed hyperlipidemia: Secondary | ICD-10-CM | POA: Insufficient documentation

## 2021-06-20 DIAGNOSIS — Z23 Encounter for immunization: Secondary | ICD-10-CM

## 2021-06-20 DIAGNOSIS — Z1211 Encounter for screening for malignant neoplasm of colon: Secondary | ICD-10-CM

## 2021-06-20 DIAGNOSIS — Z79899 Other long term (current) drug therapy: Secondary | ICD-10-CM

## 2021-06-20 DIAGNOSIS — E1165 Type 2 diabetes mellitus with hyperglycemia: Secondary | ICD-10-CM | POA: Insufficient documentation

## 2021-06-20 DIAGNOSIS — R21 Rash and other nonspecific skin eruption: Secondary | ICD-10-CM

## 2021-06-20 DIAGNOSIS — I10 Essential (primary) hypertension: Secondary | ICD-10-CM | POA: Diagnosis not present

## 2021-06-20 DIAGNOSIS — Z8739 Personal history of other diseases of the musculoskeletal system and connective tissue: Secondary | ICD-10-CM | POA: Insufficient documentation

## 2021-06-20 MED ORDER — TRIAMCINOLONE ACETONIDE 0.1 % EX CREA: 1.0000 "application " | TOPICAL_CREAM | Freq: Every day | CUTANEOUS | 0 refills | Status: DC | PRN

## 2021-06-20 NOTE — Assessment & Plan Note (Signed)
Treating with triamcinolone. 

## 2021-06-20 NOTE — Progress Notes (Signed)
? ?Subjective:  ?Patient ID: [name], male    DOB: 12/12/1955  Age: 66 y.o. MRN: [id number] ? ?CC: Establish care ? ?HPI ?[name] is a 66 y.o. male presents to the clinic today to establish care.  Current issues and concerns are below. ? ?CAD ?Stable. ?Follows with cardiology. ?No current chest pain or shortness of breath.  He is on aspirin, Plavix, Imdur, metoprolol. ? ?Hypertension ?Has been stable on metoprolol and Imdur. ? ?Hyperlipidemia ?Has been well controlled on Crestor and Praluent.  Tolerating well. ? ?Type 2 diabetes ?Last year had labs done by cardiology which revealed an A1c of 8.7. ?He has not seen a primary care provider in several years. ?He is not currently on any pharmacotherapy regarding type 2 diabetes. ?Reports urinary frequency.  No weight loss.  No reports of polydipsia. ?Needs A1c. ? ?Dry, itchy skin ?Patient reports itchiness in the inguinal region/groin. ?Has been going on for the past year. ?No relief with topical antifungals. ? ?PMH, Surgical Hx, Family Hx, Social History reviewed and updated as below. ? ?Past Medical History:  ?Diagnosis Date  ? Arthritis   ? CAD (coronary artery disease) 2000  ? a. History of MI ~2000 s/p PCI. b. CABG in 2006 (LIMA-LAD, RIMA-acute marginal, SVG-DX, SVG-RCA). c. NSTEMI 05/2013:severe stenoses of the SVG-PDA and SVG-diagonal, both treated successfully with PCI (DES x2).  ? GERD (gastroesophageal reflux disease)   ? Gout   ? History of hypertension   ? Hydrocele 2012  ? Hyperlipidemia   ? Hypertension   ? Myocardial infarction [facility]) 2014  ? x3  ? Obesity   ? S/P coronary angioplasty 11/10/17 11/11/2017  ? ?Past Surgical History:  ?Procedure Laterality Date  ? CATARACT EXTRACTION W/PHACO Left 05/17/2012  ? Procedure: CATARACT EXTRACTION PHACO AND INTRAOCULAR LENS PLACEMENT (IOC);  Surgeon: Tonny Branch, MD;  Location: [facility];  Service: Ophthalmology;  Laterality: Left;  CDE:  0.76  ? CATARACT EXTRACTION W/PHACO Right 08/26/2012  ? Procedure:  CATARACT EXTRACTION PHACO AND INTRAOCULAR LENS PLACEMENT (IOC);  Surgeon: Tonny Branch, MD;  Location: [facility];  Service: Ophthalmology;  Laterality: Right;  CDE: 1.13  ? CORONARY ANGIOPLASTY  2000  ? stents x2  ? CORONARY ARTERY BYPASS GRAFT  2006  ? 4 vessels  ? Coronary artery bypass grafting  May 2006  ? Four-vessel  ? CORONARY BALLOON ANGIOPLASTY N/A 11/10/2017  ? Procedure: CORONARY BALLOON ANGIOPLASTY;  Surgeon: [name], MD;  Location: [place] [facility];  Service: Cardiovascular;  Laterality: N/A;  ? FOOT FRACTURE SURGERY    ? right  ? LEFT HEART CATH AND CORS/GRAFTS ANGIOGRAPHY N/A 11/10/2017  ? Procedure: LEFT HEART CATH AND CORS/GRAFTS ANGIOGRAPHY;  Surgeon: [name], MD;  Location: [place] [facility];  Service: Cardiovascular;  Laterality: N/A;  ? LEFT HEART CATHETERIZATION WITH CORONARY/GRAFT ANGIOGRAM N/A 05/05/2011  ? Procedure: LEFT HEART CATHETERIZATION WITH Beatrix Fetters;  Surgeon: Josue Hector, MD;  Location: [facility] CATH LAB;  Service: Cardiovascular;  Laterality: N/A;  ? LEFT HEART CATHETERIZATION WITH CORONARY/GRAFT ANGIOGRAM N/A 06/06/2013  ? Procedure: LEFT HEART CATHETERIZATION WITH Beatrix Fetters;  Surgeon: Blane Ohara, MD;  Location: [facility] CATH LAB;  Service: Cardiovascular;  Laterality: N/A;  ? MASS EXCISION Right 05/14/2016  ? Procedure: EXCISION MASS RIGHT ELBOW;  Surgeon: Carole Civil, MD;  Location: [facility];  Service: Orthopedics;  Laterality: Right;  ? PILONIDAL CYST EXCISION    ? TONSILLECTOMY    ? ?Family History  ?Problem Relation Age of Onset  ?  Cancer Mother   ? Cervical cancer Mother   ? Cancer Father   ? Throat cancer Father   ? Thyroid disease Son   ? Healthy Daughter   ? Healthy Son   ? ?Social History  ? ?Tobacco Use  ? Smoking status: Never  ? Smokeless tobacco: Never  ?Substance Use Topics  ? Alcohol use: No  ? ? ?Review of Systems  ?Respiratory: Negative.    ?Cardiovascular: Negative.   ?Genitourinary:  Positive for frequency.  ?Skin:   ?      Itchiness, groin  ? ?Objective:  ? ?Today's Vitals: BP 140/72   Pulse (!) 57   Temp 97.9 ?F (36.6 ?C)   Ht 5' 7"  (1.702 m)   Wt 268 lb 12.8 oz (121.9 kg)   SpO2 96%   BMI 42.10 kg/m?  ? ?Physical Exam ?Constitutional:   ?   Appearance: Normal appearance. He is obese.  ?HENT:  ?   Head: Normocephalic and atraumatic.  ?   Right Ear: Tympanic membrane normal.  ?   Left Ear: Tympanic membrane normal.  ?   Nose: Nose normal.  ?   Mouth/Throat:  ?   Pharynx: Oropharynx is clear.  ?Eyes:  ?   General:     ?   Right eye: No discharge.     ?   Left eye: No discharge.  ?   Conjunctiva/sclera: Conjunctivae normal.  ?Cardiovascular:  ?   Rate and Rhythm: Regular rhythm. Bradycardia present.  ?   Heart sounds: No murmur heard. ?Pulmonary:  ?   Effort: Pulmonary effort is normal.  ?   Breath sounds: Normal breath sounds. No wheezing, rhonchi or rales.  ?Abdominal:  ?   General: There is no distension.  ?   Palpations: Abdomen is soft.  ?   Tenderness: There is no abdominal tenderness.  ?Genitourinary: ?   Comments: Upper medial thighs and perineal region with dry skin.  No evidence of tinea cruris. ?Musculoskeletal:  ?   Cervical back: Neck supple.  ?Neurological:  ?   Mental Status: He is alert.  ?Psychiatric:     ?   Mood and Affect: Mood normal.     ?   Behavior: Behavior normal.  ? ? ? ?Assessment & Plan:  ? ?Problem List Items Addressed This Visit   ? ?  ? Cardiovascular and Mediastinum  ? Essential hypertension - Primary  ?  Stable.  Continue Imdur and metoprolol. ?  ?  ? Coronary artery disease involving native coronary artery with angina pectoris ([place])  ?  Stable.  Continue aspirin, Plavix, Imdur, metoprolol. ?  ?  ?  ? Endocrine  ? Type 2 diabetes mellitus with hyperglycemia ([facility])  ?  Unsure of status/control.  A1c today. ?  ?  ? Relevant Orders  ? CMP14+EGFR  ? Hemoglobin A1c  ? Microalbumin / creatinine urine ratio  ?  ? Musculoskeletal and Integument  ? Rash  ?  Treating with triamcinolone. ?  ?  ?  ? Other   ? Mixed hyperlipidemia  ?  Has been very well controlled.  Labs today.  Continue Praluent and Crestor. ?  ?  ? Relevant Orders  ? Lipid panel  ? ?Other Visit Diagnoses   ? ? Prostate cancer screening      ? Relevant Orders  ? PSA  ? Urinary frequency      ? Relevant Orders  ? PSA  ? High risk medication use      ?  Relevant Orders  ? CBC  ? Encounter for screening colonoscopy      ? Relevant Orders  ? Ambulatory referral to Gastroenterology  ? Need for vaccination      ? Relevant Orders  ? Pneumococcal conjugate vaccine 20-valent (Prevnar 20) (Completed)  ? ?  ? ? ?Meds ordered this encounter  ?Medications  ? triamcinolone cream (KENALOG) 0.1 %  ?  Sig: Apply 1 application. topically daily as needed.  ?  Dispense:  30 g  ?  Refill:  0  ? ? ? ?Follow-up: Return in about 3 months (around 09/20/2021). ? ?Thersa Salt DO ?[place] ? ? ? ? ?

## 2021-06-20 NOTE — Assessment & Plan Note (Signed)
Has been very well controlled.  Labs today.  Continue Praluent and Crestor. ?

## 2021-06-20 NOTE — Patient Instructions (Addendum)
Labs today. ? ?Continue your current medications. ? ?Referral placed to GI. ? ?Follow up in 3 months (or sooner based on labs). ? ?Take care ? ?Dr. Lacinda Axon  ?

## 2021-06-20 NOTE — Assessment & Plan Note (Signed)
Stable.  Continue Imdur and metoprolol. ?

## 2021-06-20 NOTE — Assessment & Plan Note (Signed)
Unsure of status/control.  A1c today. ?

## 2021-06-20 NOTE — Assessment & Plan Note (Signed)
Stable.  Continue aspirin, Plavix, Imdur, metoprolol. ?

## 2021-06-21 LAB — CBC
Hematocrit: 44.2 % (ref 37.5–51.0)
Hemoglobin: 14.7 g/dL (ref 13.0–17.7)
MCH: 29.8 pg (ref 26.6–33.0)
MCHC: 33.3 g/dL (ref 31.5–35.7)
MCV: 90 fL (ref 79–97)
Platelets: 220 10*3/uL (ref 150–450)
RBC: 4.94 x10E6/uL (ref 4.14–5.80)
RDW: 13 % (ref 11.6–15.4)
WBC: 7.4 10*3/uL (ref 3.4–10.8)

## 2021-06-21 LAB — LIPID PANEL
Chol/HDL Ratio: 2.7 ratio (ref 0.0–5.0)
Cholesterol, Total: 115 mg/dL (ref 100–199)
HDL: 42 mg/dL (ref >39)
LDL Chol Calc (NIH): 47 mg/dL (ref 0–99)
Triglycerides: 156 mg/dL — ABNORMAL HIGH (ref 0–149)
VLDL Cholesterol Cal: 26 mg/dL (ref 5–40)

## 2021-06-21 LAB — CMP14+EGFR
ALT: 24 IU/L (ref 0–44)
AST: 27 IU/L (ref 0–40)
Albumin/Globulin Ratio: 2.1 (ref 1.2–2.2)
Albumin: 5 g/dL — ABNORMAL HIGH (ref 3.8–4.8)
Alkaline Phosphatase: 116 IU/L (ref 44–121)
BUN/Creatinine Ratio: 12 (ref 10–24)
BUN: 12 mg/dL (ref 8–27)
Bilirubin Total: 1.1 mg/dL (ref 0.0–1.2)
CO2: 26 mmol/L (ref 20–29)
Calcium: 9.7 mg/dL (ref 8.6–10.2)
Chloride: 98 mmol/L (ref 96–106)
Creatinine, Ser: 1.02 mg/dL (ref 0.76–1.27)
Globulin, Total: 2.4 g/dL (ref 1.5–4.5)
Glucose: 262 mg/dL — ABNORMAL HIGH (ref 70–99)
Potassium: 4.9 mmol/L (ref 3.5–5.2)
Sodium: 138 mmol/L (ref 134–144)
Total Protein: 7.4 g/dL (ref 6.0–8.5)
eGFR: 81 mL/min/{1.73_m2} (ref >59)

## 2021-06-21 LAB — MICROALBUMIN / CREATININE URINE RATIO
Creatinine, Urine: 229.8 mg/dL
Microalb/Creat Ratio: 103 mg/g creat — ABNORMAL HIGH (ref 0–29)
Microalbumin, Urine: 237.5 ug/mL

## 2021-06-21 LAB — HEMOGLOBIN A1C
Est. average glucose Bld gHb Est-mCnc: 280 mg/dL
Hgb A1c MFr Bld: 11.4 % — ABNORMAL HIGH (ref 4.8–5.6)

## 2021-06-21 LAB — PSA: Prostate Specific Ag, Serum: 0.4 ng/mL (ref 0.0–4.0)

## 2021-06-24 ENCOUNTER — Other Ambulatory Visit: Payer: Self-pay | Admitting: Family Medicine

## 2021-06-24 DIAGNOSIS — E1165 Type 2 diabetes mellitus with hyperglycemia: Secondary | ICD-10-CM

## 2021-06-24 MED ORDER — METFORMIN HCL 500 MG PO TABS: 500.0000 mg | ORAL_TABLET | Freq: Two times a day (BID) | ORAL | 3 refills | Status: DC

## 2021-06-26 ENCOUNTER — Encounter (INDEPENDENT_AMBULATORY_CARE_PROVIDER_SITE_OTHER): Payer: Self-pay | Admitting: *Deleted

## 2021-06-28 ENCOUNTER — Other Ambulatory Visit: Payer: Self-pay | Admitting: Nurse Practitioner

## 2021-06-28 ENCOUNTER — Ambulatory Visit (INDEPENDENT_AMBULATORY_CARE_PROVIDER_SITE_OTHER): Payer: Medicare Other | Admitting: Nurse Practitioner

## 2021-06-28 ENCOUNTER — Encounter: Payer: Self-pay | Admitting: Nurse Practitioner

## 2021-06-28 VITALS — BP 132/66 | HR 43 | Ht 67.5 in | Wt 268.2 lb

## 2021-06-28 DIAGNOSIS — I10 Essential (primary) hypertension: Secondary | ICD-10-CM

## 2021-06-28 DIAGNOSIS — E1165 Type 2 diabetes mellitus with hyperglycemia: Secondary | ICD-10-CM | POA: Diagnosis not present

## 2021-06-28 DIAGNOSIS — E782 Mixed hyperlipidemia: Secondary | ICD-10-CM | POA: Diagnosis not present

## 2021-06-28 MED ORDER — ONETOUCH ULTRASOFT LANCETS MISC: 12 refills | Status: DC

## 2021-06-28 MED ORDER — ONETOUCH VERIO VI STRP: ORAL_STRIP | 12 refills | Status: DC

## 2021-06-28 NOTE — Patient Instructions (Signed)

## 2021-06-28 NOTE — Progress Notes (Signed)
? ?                                                    Endocrinology Consult Note  ?     06/28/2021, 10:20 AM ? ? ?Subjective:  ? ? Patient ID: [name], male    DOB: February 02, 1956.  ?[name] is being seen in consultation for management of currently uncontrolled symptomatic diabetes requested by  Coral Spikes, DO. ? ? ?Past Medical History:  ?Diagnosis Date  ? Arthritis   ? CAD (coronary artery disease) 2000  ? a. History of MI ~2000 s/p PCI. b. CABG in 2006 (LIMA-LAD, RIMA-acute marginal, SVG-DX, SVG-RCA). c. NSTEMI 05/2013:severe stenoses of the SVG-PDA and SVG-diagonal, both treated successfully with PCI (DES x2).  ? Diabetes ([place])   ? GERD (gastroesophageal reflux disease)   ? Gout   ? History of hypertension   ? Hydrocele 2012  ? Hyperlipidemia   ? Hypertension   ? Myocardial infarction [facility]) 2014  ? x3  ? Obesity   ? S/P coronary angioplasty 11/10/17 11/11/2017  ? ? ?Past Surgical History:  ?Procedure Laterality Date  ? CATARACT EXTRACTION W/PHACO Left 05/17/2012  ? Procedure: CATARACT EXTRACTION PHACO AND INTRAOCULAR LENS PLACEMENT (IOC);  Surgeon: Tonny Branch, MD;  Location: [facility];  Service: Ophthalmology;  Laterality: Left;  CDE:  0.76  ? CATARACT EXTRACTION W/PHACO Right 08/26/2012  ? Procedure: CATARACT EXTRACTION PHACO AND INTRAOCULAR LENS PLACEMENT (IOC);  Surgeon: Tonny Branch, MD;  Location: [facility];  Service: Ophthalmology;  Laterality: Right;  CDE: 1.13  ? CORONARY ANGIOPLASTY  2000  ? stents x2  ? CORONARY ARTERY BYPASS GRAFT  2006  ? 4 vessels  ? Coronary artery bypass grafting  May 2006  ? Four-vessel  ? CORONARY BALLOON ANGIOPLASTY N/A 11/10/2017  ? Procedure: CORONARY BALLOON ANGIOPLASTY;  Surgeon: [name], MD;  Location: [place] [facility];  Service: Cardiovascular;  Laterality: N/A;  ? FOOT FRACTURE SURGERY    ? right  ? LEFT HEART CATH AND CORS/GRAFTS ANGIOGRAPHY N/A 11/10/2017  ? Procedure: LEFT HEART CATH AND CORS/GRAFTS ANGIOGRAPHY;  Surgeon: [name],  MD;  Location: [place] [facility];  Service: Cardiovascular;  Laterality: N/A;  ? LEFT HEART CATHETERIZATION WITH CORONARY/GRAFT ANGIOGRAM N/A 05/05/2011  ? Procedure: LEFT HEART CATHETERIZATION WITH Beatrix Fetters;  Surgeon: Josue Hector, MD;  Location: [facility] CATH LAB;  Service: Cardiovascular;  Laterality: N/A;  ? LEFT HEART CATHETERIZATION WITH CORONARY/GRAFT ANGIOGRAM N/A 06/06/2013  ? Procedure: LEFT HEART CATHETERIZATION WITH Beatrix Fetters;  Surgeon: Blane Ohara, MD;  Location: [facility] CATH LAB;  Service: Cardiovascular;  Laterality: N/A;  ? MASS EXCISION Right 05/14/2016  ? Procedure: EXCISION MASS RIGHT ELBOW;  Surgeon: Carole Civil, MD;  Location: [facility];  Service: Orthopedics;  Laterality: Right;  ? PILONIDAL CYST EXCISION    ? TONSILLECTOMY    ? ? ?Social History  ? ?Socioeconomic History  ? Marital status: Divorced  ?  Spouse name: Not on file  ? Number of children: Not on file  ? Years of education: Not on file  ? Highest education level: Not on file  ?Occupational History  ? Not on file  ?Tobacco Use  ? Smoking status: Never  ? Smokeless tobacco: Never  ?Vaping Use  ? Vaping Use: Never used  ?Substance and Sexual Activity  ?  Alcohol use: No  ? Drug use: No  ? Sexual activity: Yes  ?  Birth control/protection: None  ?Other Topics Concern  ? Not on file  ?Social History Narrative  ? Not on file  ? ?Social Determinants of Health  ? ?Financial Resource Strain: Not on file  ?Food Insecurity: Not on file  ?Transportation Needs: Not on file  ?Physical Activity: Not on file  ?Stress: Not on file  ?Social Connections: Not on file  ? ? ?Family History  ?Problem Relation Age of Onset  ? Cancer Mother   ? Cervical cancer Mother   ? Cancer Father   ? Throat cancer Father   ? Thyroid disease Son   ? Healthy Daughter   ? Healthy Son   ? ? ?Outpatient Encounter Medications as of 06/28/2021  ?Medication Sig  ? aspirin EC 81 MG tablet Take 81 mg by mouth daily.  ? clopidogrel (PLAVIX) 75 MG tablet  TAKE 1 TABLET BY MOUTH EVERY DAY  ? colchicine 0.6 MG tablet Take 0.6 mg by mouth daily as needed (gout flares).  ? glucose blood (ONETOUCH VERIO) test strip Use as instructed to monitor glucose twice daily  ? isosorbide mononitrate (IMDUR) 60 MG 24 hr tablet Take 60 mg by mouth daily.  ? Lancets (ONETOUCH ULTRASOFT) lancets Use as instructed to monitor glucose twice daily  ? metFORMIN (GLUCOPHAGE) 500 MG tablet Take 1 tablet (500 mg total) by mouth 2 (two) times daily with a meal. After 1 week, increase to 1000 mg in the am and 500 in the pm. After an additional week, increase to 1000 mg twice daily.  ? metoprolol tartrate (LOPRESSOR) 25 MG tablet TAKE 1 TABLET BY MOUTH TWICE A DAY  ? nitroGLYCERIN (NITROSTAT) 0.4 MG SL tablet PLACE 1 TABLET UNDER THE TONGUE EVERY 5 MINUTES AS NEEDED FOR CHEST PAIN.  ? PRALUENT 75 MG/ML SOAJ INJECT 1 PEN INTO THE SKIN EVERY 14 DAYS  ? rosuvastatin (CRESTOR) 40 MG tablet TAKE 1 TABLET BY MOUTH EVERY DAY  ? triamcinolone cream (KENALOG) 0.1 % Apply 1 application. topically daily as needed.  ? ?No facility-administered encounter medications on file as of 06/28/2021.  ? ? ?ALLERGIES: ?Allergies  ?Allergen Reactions  ? Hydrocodone Nausea Only  ? ? ?VACCINATION STATUS: ?Immunization History  ?Administered Date(s) Administered  ? Influenza,trivalent, recombinat, inj, PF 01/09/2017  ? Influenza-Unspecified 01/22/2018  ? Moderna Sars-Covid-2 Vaccination 05/20/2019, 06/21/2019  ? PNEUMOCOCCAL CONJUGATE-20 06/20/2021  ? Tdap 03/24/2014  ? ? ?Diabetes ?He presents for his initial diabetic visit. He has type 2 diabetes mellitus. Onset time: Recently diagnosed at age 66. There are no hypoglycemic associated symptoms. Associated symptoms include blurred vision, fatigue, polydipsia, polyuria and weight loss. There are no hypoglycemic complications. Symptoms are stable. Diabetic complications include heart disease and nephropathy. Risk factors for coronary artery disease include diabetes mellitus,  dyslipidemia, family history, obesity, male sex, hypertension and sedentary lifestyle. Current diabetic treatment includes oral agent (monotherapy) (recently started on Metformin). He is compliant with treatment most of the time. His weight is decreasing steadily. He is following a generally unhealthy diet. When asked about meal planning, he reported none. He has not had a previous visit with a dietitian. He never participates in exercise. (He presents today for his consultation with no meter or logs to review.  His most recent A1c on 3/30 was 11.4% and he was subsequently started on Metformin.  He does not have a glucose meter.  He drinks mostly sodas and tea, very little water and  eats 1-2 meals per day with rare snacks.  He does not engage in routine physical exercise.  He is UTD on eye exam, does see podiatry.  He denies any s/s of hypoglycemia.) An ACE inhibitor/angiotensin II receptor blocker is not being taken. He sees a podiatrist.Eye exam is current.  ?Hypertension ?This is a chronic problem. The current episode started more than 1 year ago. The problem has been resolved since onset. The problem is controlled. Associated symptoms include blurred vision. There are no associated agents to hypertension. Risk factors for coronary artery disease include diabetes mellitus, dyslipidemia, family history, obesity and male gender. Past treatments include beta blockers. The current treatment provides mild improvement. Compliance problems include diet and exercise.  Hypertensive end-organ damage includes kidney disease and CAD/MI. Identifiable causes of hypertension include chronic renal disease.  ?Hyperlipidemia ?This is a chronic problem. The current episode started more than 1 year ago. The problem is controlled. Recent lipid tests were reviewed and are normal. Exacerbating diseases include chronic renal disease and diabetes. Factors aggravating his hyperlipidemia include fatty foods and beta blockers. Current  antihyperlipidemic treatment includes statins. The current treatment provides moderate improvement of lipids. Compliance problems include adherence to diet and adherence to exercise.  Risk factors for coronar

## 2021-07-12 ENCOUNTER — Ambulatory Visit (INDEPENDENT_AMBULATORY_CARE_PROVIDER_SITE_OTHER): Payer: Medicare Other | Admitting: Nurse Practitioner

## 2021-07-12 ENCOUNTER — Encounter: Payer: Self-pay | Admitting: Nurse Practitioner

## 2021-07-12 VITALS — BP 148/75 | HR 52 | Ht 67.5 in | Wt 267.8 lb

## 2021-07-12 DIAGNOSIS — I10 Essential (primary) hypertension: Secondary | ICD-10-CM

## 2021-07-12 DIAGNOSIS — E1165 Type 2 diabetes mellitus with hyperglycemia: Secondary | ICD-10-CM | POA: Diagnosis not present

## 2021-07-12 DIAGNOSIS — E782 Mixed hyperlipidemia: Secondary | ICD-10-CM | POA: Diagnosis not present

## 2021-07-12 MED ORDER — ONETOUCH VERIO VI STRP: ORAL_STRIP | 12 refills | Status: DC

## 2021-07-12 NOTE — Patient Instructions (Signed)
Diabetes Mellitus and Foot Care Foot care is an important part of your health, especially when you have diabetes. Diabetes may cause you to have problems because of poor blood flow (circulation) to your feet and legs, which can cause your skin to: Become thinner and drier. Break more easily. Heal more slowly. Peel and crack. You may also have nerve damage (neuropathy) in your legs and feet, causing decreased feeling in them. This means that you may not notice minor injuries to your feet that could lead to more serious problems. Noticing and addressing any potential problems early is the best way to prevent future foot problems. How to care for your feet Foot hygiene  Wash your feet daily with warm water and mild soap. Do not use hot water. Then, pat your feet and the areas between your toes until they are completely dry. Do not soak your feet as this can dry your skin. Trim your toenails straight across. Do not dig under them or around the cuticle. File the edges of your nails with an emery board or nail file. Apply a moisturizing lotion or petroleum jelly to the skin on your feet and to dry, brittle toenails. Use lotion that does not contain alcohol and is unscented. Do not apply lotion between your toes. Shoes and socks Wear clean socks or stockings every day. Make sure they are not too tight. Do not wear knee-high stockings since they may decrease blood flow to your legs. Wear shoes that fit properly and have enough cushioning. Always look in your shoes before you put them on to be sure there are no objects inside. To break in new shoes, wear them for just a few hours a day. This prevents injuries on your feet. Wounds, scrapes, corns, and calluses  Check your feet daily for blisters, cuts, bruises, sores, and redness. If you cannot see the bottom of your feet, use a mirror or ask someone for help. Do not cut corns or calluses or try to remove them with medicine. If you find a minor scrape,  cut, or break in the skin on your feet, keep it and the skin around it clean and dry. You may clean these areas with mild soap and water. Do not clean the area with peroxide, alcohol, or iodine. If you have a wound, scrape, corn, or callus on your foot, look at it several times a day to make sure it is healing and not infected. Check for: Redness, swelling, or pain. Fluid or blood. Warmth. Pus or a bad smell. General tips Do not cross your legs. This may decrease blood flow to your feet. Do not use heating pads or hot water bottles on your feet. They may burn your skin. If you have lost feeling in your feet or legs, you may not know this is happening until it is too late. Protect your feet from hot and cold by wearing shoes, such as at the beach or on hot pavement. Schedule a complete foot exam at least once a year (annually) or more often if you have foot problems. Report any cuts, sores, or bruises to your health care provider immediately. Where to find more information American Diabetes Association: www.diabetes.org Association of Diabetes Care & Education Specialists: www.diabeteseducator.org Contact a health care provider if: You have a medical condition that increases your risk of infection and you have any cuts, sores, or bruises on your feet. You have an injury that is not healing. You have redness on your legs or feet. You   feel burning or tingling in your legs or feet. You have pain or cramps in your legs and feet. Your legs or feet are numb. Your feet always feel cold. You have pain around any toenails. Get help right away if: You have a wound, scrape, corn, or callus on your foot and: You have pain, swelling, or redness that gets worse. You have fluid or blood coming from the wound, scrape, corn, or callus. Your wound, scrape, corn, or callus feels warm to the touch. You have pus or a bad smell coming from the wound, scrape, corn, or callus. You have a fever. You have a red  line going up your leg. Summary Check your feet every day for blisters, cuts, bruises, sores, and redness. Apply a moisturizing lotion or petroleum jelly to the skin on your feet and to dry, brittle toenails. Wear shoes that fit properly and have enough cushioning. If you have foot problems, report any cuts, sores, or bruises to your health care provider immediately. Schedule a complete foot exam at least once a year (annually) or more often if you have foot problems. This information is not intended to replace advice given to you by your health care provider. Make sure you discuss any questions you have with your health care provider. Document Revised: 09/29/2019 Document Reviewed: 09/29/2019 Elsevier Patient Education  2023 Elsevier Inc.  

## 2021-07-12 NOTE — Progress Notes (Signed)
? ?                                                    Endocrinology Follow Up Note  ?     07/12/2021, 10:08 AM ? ? ?Subjective:  ? ? Patient ID: [name], male    DOB: 10/18/1955.  ?[name] is being seen in follow up after being seen in consultation for management of currently uncontrolled symptomatic diabetes requested by  Coral Spikes, DO. ? ? ?Past Medical History:  ?Diagnosis Date  ? Arthritis   ? CAD (coronary artery disease) 2000  ? a. History of MI ~2000 s/p PCI. b. CABG in 2006 (LIMA-LAD, RIMA-acute marginal, SVG-DX, SVG-RCA). c. NSTEMI 05/2013:severe stenoses of the SVG-PDA and SVG-diagonal, both treated successfully with PCI (DES x2).  ? Diabetes ([place])   ? GERD (gastroesophageal reflux disease)   ? Gout   ? History of hypertension   ? Hydrocele 2012  ? Hyperlipidemia   ? Hypertension   ? Myocardial infarction [facility]) 2014  ? x3  ? Obesity   ? S/P coronary angioplasty 11/10/17 11/11/2017  ? ? ?Past Surgical History:  ?Procedure Laterality Date  ? CATARACT EXTRACTION W/PHACO Left 05/17/2012  ? Procedure: CATARACT EXTRACTION PHACO AND INTRAOCULAR LENS PLACEMENT (IOC);  Surgeon: Tonny Branch, MD;  Location: [facility];  Service: Ophthalmology;  Laterality: Left;  CDE:  0.76  ? CATARACT EXTRACTION W/PHACO Right 08/26/2012  ? Procedure: CATARACT EXTRACTION PHACO AND INTRAOCULAR LENS PLACEMENT (IOC);  Surgeon: Tonny Branch, MD;  Location: [facility];  Service: Ophthalmology;  Laterality: Right;  CDE: 1.13  ? CORONARY ANGIOPLASTY  2000  ? stents x2  ? CORONARY ARTERY BYPASS GRAFT  2006  ? 4 vessels  ? Coronary artery bypass grafting  May 2006  ? Four-vessel  ? CORONARY BALLOON ANGIOPLASTY N/A 11/10/2017  ? Procedure: CORONARY BALLOON ANGIOPLASTY;  Surgeon: [name], MD;  Location: [place] [facility];  Service: Cardiovascular;  Laterality: N/A;  ? FOOT FRACTURE SURGERY    ? right  ? LEFT HEART CATH AND CORS/GRAFTS ANGIOGRAPHY N/A 11/10/2017  ? Procedure: LEFT HEART CATH AND CORS/GRAFTS  ANGIOGRAPHY;  Surgeon: [name], MD;  Location: [place] [facility];  Service: Cardiovascular;  Laterality: N/A;  ? LEFT HEART CATHETERIZATION WITH CORONARY/GRAFT ANGIOGRAM N/A 05/05/2011  ? Procedure: LEFT HEART CATHETERIZATION WITH Beatrix Fetters;  Surgeon: Josue Hector, MD;  Location: [facility] CATH LAB;  Service: Cardiovascular;  Laterality: N/A;  ? LEFT HEART CATHETERIZATION WITH CORONARY/GRAFT ANGIOGRAM N/A 06/06/2013  ? Procedure: LEFT HEART CATHETERIZATION WITH Beatrix Fetters;  Surgeon: Blane Ohara, MD;  Location: [facility] CATH LAB;  Service: Cardiovascular;  Laterality: N/A;  ? MASS EXCISION Right 05/14/2016  ? Procedure: EXCISION MASS RIGHT ELBOW;  Surgeon: Carole Civil, MD;  Location: [facility];  Service: Orthopedics;  Laterality: Right;  ? PILONIDAL CYST EXCISION    ? TONSILLECTOMY    ? ? ?Social History  ? ?Socioeconomic History  ? Marital status: Divorced  ?  Spouse name: Not on file  ? Number of children: Not on file  ? Years of education: Not on file  ? Highest education level: Not on file  ?Occupational History  ? Not on file  ?Tobacco Use  ? Smoking status: Never  ? Smokeless tobacco: Never  ?Vaping Use  ? Vaping Use:  Never used  ?Substance and Sexual Activity  ? Alcohol use: No  ? Drug use: No  ? Sexual activity: Yes  ?  Birth control/protection: None  ?Other Topics Concern  ? Not on file  ?Social History Narrative  ? Not on file  ? ?Social Determinants of Health  ? ?Financial Resource Strain: Not on file  ?Food Insecurity: Not on file  ?Transportation Needs: Not on file  ?Physical Activity: Not on file  ?Stress: Not on file  ?Social Connections: Not on file  ? ? ?Family History  ?Problem Relation Age of Onset  ? Cancer Mother   ? Cervical cancer Mother   ? Cancer Father   ? Throat cancer Father   ? Thyroid disease Son   ? Healthy Daughter   ? Healthy Son   ? ? ?Outpatient Encounter Medications as of 07/12/2021  ?Medication Sig  ? aspirin EC 81 MG tablet Take 81 mg by mouth  daily.  ? clopidogrel (PLAVIX) 75 MG tablet TAKE 1 TABLET BY MOUTH EVERY DAY  ? colchicine 0.6 MG tablet Take 0.6 mg by mouth daily as needed (gout flares).  ? isosorbide mononitrate (IMDUR) 60 MG 24 hr tablet Take 60 mg by mouth daily.  ? Lancets (ONETOUCH DELICA PLUS [id number]) MISC Use to monitor glucose twice daily.  ? metFORMIN (GLUCOPHAGE) 500 MG tablet Take 1 tablet (500 mg total) by mouth 2 (two) times daily with a meal. After 1 week, increase to 1000 mg in the am and 500 in the pm. After an additional week, increase to 1000 mg twice daily.  ? metoprolol tartrate (LOPRESSOR) 25 MG tablet TAKE 1 TABLET BY MOUTH TWICE A DAY  ? nitroGLYCERIN (NITROSTAT) 0.4 MG SL tablet PLACE 1 TABLET UNDER THE TONGUE EVERY 5 MINUTES AS NEEDED FOR CHEST PAIN.  ? PRALUENT 75 MG/ML SOAJ INJECT 1 PEN INTO THE SKIN EVERY 14 DAYS  ? rosuvastatin (CRESTOR) 40 MG tablet TAKE 1 TABLET BY MOUTH EVERY DAY  ? triamcinolone cream (KENALOG) 0.1 % Apply 1 application. topically daily as needed.  ? [DISCONTINUED] glucose blood (ONETOUCH VERIO) test strip Use to monitor glucose twice daily.  ? glucose blood (ONETOUCH VERIO) test strip Use to monitor glucose twice daily.  ? ?No facility-administered encounter medications on file as of 07/12/2021.  ? ? ?ALLERGIES: ?Allergies  ?Allergen Reactions  ? Hydrocodone Nausea Only  ? ? ?VACCINATION STATUS: ?Immunization History  ?Administered Date(s) Administered  ? Influenza,trivalent, recombinat, inj, PF 01/09/2017  ? Influenza-Unspecified 01/22/2018  ? Moderna Sars-Covid-2 Vaccination 05/20/2019, 06/21/2019  ? PNEUMOCOCCAL CONJUGATE-20 06/20/2021  ? Tdap 03/24/2014  ? ? ?Diabetes ?He presents for his follow-up diabetic visit. He has type 2 diabetes mellitus. Onset time: Recently diagnosed at age 66. His disease course has been improving. There are no hypoglycemic associated symptoms. Associated symptoms include blurred vision, fatigue, polydipsia, polyuria and weight loss. There are no hypoglycemic  complications. Symptoms are improving. Diabetic complications include heart disease and nephropathy. Risk factors for coronary artery disease include diabetes mellitus, dyslipidemia, family history, obesity, male sex, hypertension and sedentary lifestyle. Current diabetic treatment includes oral agent (monotherapy) (recently started on Metformin). He is compliant with treatment most of the time. His weight is stable. He is following a generally unhealthy diet. When asked about meal planning, he reported none. He has not had a previous visit with a dietitian. He never participates in exercise. His home blood glucose trend is decreasing steadily. His breakfast blood glucose range is generally 140-180 mg/dl. His lunch blood glucose  range is generally 140-180 mg/dl. His dinner blood glucose range is generally 180-200 mg/dl. His bedtime blood glucose range is generally 140-180 mg/dl. (He presents today with his meter and logs showing improving glycemic profile with near target fasting and postprandial readings.  He was not due for another A1c today.  He notes he has struggled with eating 3 meals per day, says he doesn't have an appetite.  He is still working on [employer] regimen, has been set up with dietician but not yet seen them.  He denies any hypoglycemia.) An ACE inhibitor/angiotensin II receptor blocker is not being taken. He sees a podiatrist.Eye exam is current.  ?Hypertension ?This is a chronic problem. The current episode started more than 1 year ago. The problem has been resolved since onset. The problem is controlled. Associated symptoms include blurred vision. There are no associated agents to hypertension. Risk factors for coronary artery disease include diabetes mellitus, dyslipidemia, family history, obesity and male gender. Past treatments include beta blockers. The current treatment provides mild improvement. Compliance problems include diet and exercise.  Hypertensive end-organ damage includes  kidney disease and CAD/MI. Identifiable causes of hypertension include chronic renal disease.  ?Hyperlipidemia ?This is a chronic problem. The current episode started more than 1 year ago. The problem is controlled. Re

## 2021-07-25 ENCOUNTER — Encounter: Payer: Self-pay | Admitting: Cardiovascular Disease

## 2021-07-25 ENCOUNTER — Ambulatory Visit (INDEPENDENT_AMBULATORY_CARE_PROVIDER_SITE_OTHER): Payer: Medicare Other | Admitting: Cardiovascular Disease

## 2021-07-25 VITALS — BP 130/72 | HR 57 | Ht 67.5 in | Wt 264.0 lb

## 2021-07-25 DIAGNOSIS — I25119 Atherosclerotic heart disease of native coronary artery with unspecified angina pectoris: Secondary | ICD-10-CM | POA: Diagnosis not present

## 2021-07-25 DIAGNOSIS — E782 Mixed hyperlipidemia: Secondary | ICD-10-CM

## 2021-07-25 DIAGNOSIS — I10 Essential (primary) hypertension: Secondary | ICD-10-CM

## 2021-07-25 NOTE — Progress Notes (Signed)
?Cardiology Office Note:   ? ?Date:  07/25/2021  ? ?ID:  [name], DOB 1955-11-16, MRN [id number] ? ?PCP:  Coral Spikes, DO ?  ?[facility] Providers ?Cardiologist:  [name], MD    ? ?Referring MD: Coral Spikes, DO  ? ?Chief Complaint  ?Patient presents with  ? Coronary Artery Disease  ? ? ?History of Present Illness:   ? ?[name] is a 66 y.o. male with a hx of: ?Coronary artery disease  ?s/p CABG in 2006 ?s/p NSTEMI >> DES to S-Dx and DES to [facility] in 2015 ?Cath 2019: S-Dx and S-RCA both occluded; PCI:  POBA to pLCx ?Hypertension  ?Hyperlipidemia  ?GERD ? ?The patient is here alone today.  When I saw him last year, we drew labs and diagnosed him with diabetes as his hemoglobin A1c was 8.7.  It took him a good while to get in with his primary care and endocrine referral, but he ultimately was started on metformin.  A more recent hemoglobin A1c was 11.4, but this predated his initiation of metformin.  He now states that his blood glucoses are running in a better range.  He is working on his diet and has cut out most sweets.  His weight is down about 11 pounds from last year's visit.  He denies symptoms of chest pain, chest pressure, shortness of breath, leg swelling, or heart palpitations. ? ?Past Medical History:  ?Diagnosis Date  ? Arthritis   ? CAD (coronary artery disease) 2000  ? a. History of MI ~2000 s/p PCI. b. CABG in 2006 (LIMA-LAD, RIMA-acute marginal, SVG-DX, SVG-RCA). c. NSTEMI 05/2013:severe stenoses of the SVG-PDA and SVG-diagonal, both treated successfully with PCI (DES x2).  ? Diabetes ([place])   ? GERD (gastroesophageal reflux disease)   ? Gout   ? History of hypertension   ? Hydrocele 2012  ? Hyperlipidemia   ? Hypertension   ? Myocardial infarction [facility]) 2014  ? x3  ? Obesity   ? S/P coronary angioplasty 11/10/17 11/11/2017  ? ? ?Past Surgical History:  ?Procedure Laterality Date  ? CATARACT EXTRACTION W/PHACO Left 05/17/2012  ? Procedure: CATARACT EXTRACTION PHACO AND  INTRAOCULAR LENS PLACEMENT (IOC);  Surgeon: Tonny Branch, MD;  Location: [facility];  Service: Ophthalmology;  Laterality: Left;  CDE:  0.76  ? CATARACT EXTRACTION W/PHACO Right 08/26/2012  ? Procedure: CATARACT EXTRACTION PHACO AND INTRAOCULAR LENS PLACEMENT (IOC);  Surgeon: Tonny Branch, MD;  Location: [facility];  Service: Ophthalmology;  Laterality: Right;  CDE: 1.13  ? CORONARY ANGIOPLASTY  2000  ? stents x2  ? CORONARY ARTERY BYPASS GRAFT  2006  ? 4 vessels  ? Coronary artery bypass grafting  May 2006  ? Four-vessel  ? CORONARY BALLOON ANGIOPLASTY N/A 11/10/2017  ? Procedure: CORONARY BALLOON ANGIOPLASTY;  Surgeon: [name], MD;  Location: [place] [facility];  Service: Cardiovascular;  Laterality: N/A;  ? FOOT FRACTURE SURGERY    ? right  ? LEFT HEART CATH AND CORS/GRAFTS ANGIOGRAPHY N/A 11/10/2017  ? Procedure: LEFT HEART CATH AND CORS/GRAFTS ANGIOGRAPHY;  Surgeon: [name], MD;  Location: [place] [facility];  Service: Cardiovascular;  Laterality: N/A;  ? LEFT HEART CATHETERIZATION WITH CORONARY/GRAFT ANGIOGRAM N/A 05/05/2011  ? Procedure: LEFT HEART CATHETERIZATION WITH Beatrix Fetters;  Surgeon: Josue Hector, MD;  Location: [facility] C[facility] LAB;  Service: Cardiovascular;  Laterality: N/A;  ? LEFT HEART CATHETERIZATION WITH CORONARY/GRAFT ANGIOGRAM N/A 06/06/2013  ? Procedure: LEFT HEART CATHETERIZATION WITH Beatrix Fetters;  Surgeon: Legrand Como  Ree Kida, MD;  Location: [facility] CATH LAB;  Service: Cardiovascular;  Laterality: N/A;  ? MASS EXCISION Right 05/14/2016  ? Procedure: EXCISION MASS RIGHT ELBOW;  Surgeon: Carole Civil, MD;  Location: [facility];  Service: Orthopedics;  Laterality: Right;  ? PILONIDAL CYST EXCISION    ? TONSILLECTOMY    ? ? ?Current Medications: ?Current Meds  ?Medication Sig  ? aspirin EC 81 MG tablet Take 81 mg by mouth daily.  ? clopidogrel (PLAVIX) 75 MG tablet TAKE 1 TABLET BY MOUTH EVERY DAY  ? colchicine 0.6 MG tablet Take 0.6 mg by mouth daily as needed (gout flares).  ?  glucose blood (ONETOUCH VERIO) test strip Use to monitor glucose twice daily.  ? isosorbide mononitrate (IMDUR) 60 MG 24 hr tablet Take 60 mg by mouth daily.  ? Lancets (ONETOUCH DELICA PLUS [id number]) MISC Use to monitor glucose twice daily.  ? metFORMIN (GLUCOPHAGE) 500 MG tablet Take 1 tablet (500 mg total) by mouth 2 (two) times daily with a meal. After 1 week, increase to 1000 mg in the am and 500 in the pm. After an additional week, increase to 1000 mg twice daily.  ? metoprolol tartrate (LOPRESSOR) 25 MG tablet TAKE 1 TABLET BY MOUTH TWICE A DAY  ? nitroGLYCERIN (NITROSTAT) 0.4 MG SL tablet PLACE 1 TABLET UNDER THE TONGUE EVERY 5 MINUTES AS NEEDED FOR CHEST PAIN.  ? PRALUENT 75 MG/ML SOAJ INJECT 1 PEN INTO THE SKIN EVERY 14 DAYS  ? rosuvastatin (CRESTOR) 40 MG tablet TAKE 1 TABLET BY MOUTH EVERY DAY  ? [DISCONTINUED] triamcinolone cream (KENALOG) 0.1 % Apply 1 application. topically daily as needed.  ?  ? ?Allergies:   Hydrocodone  ? ?Social History  ? ?Socioeconomic History  ? Marital status: Divorced  ?  Spouse name: Not on file  ? Number of children: Not on file  ? Years of education: Not on file  ? Highest education level: Not on file  ?Occupational History  ? Not on file  ?Tobacco Use  ? Smoking status: Never  ? Smokeless tobacco: Never  ?Vaping Use  ? Vaping Use: Never used  ?Substance and Sexual Activity  ? Alcohol use: No  ? Drug use: No  ? Sexual activity: Yes  ?  Birth control/protection: None  ?Other Topics Concern  ? Not on file  ?Social History Narrative  ? Not on file  ? ?Social Determinants of Health  ? ?Financial Resource Strain: Not on file  ?Food Insecurity: Not on file  ?Transportation Needs: Not on file  ?Physical Activity: Not on file  ?Stress: Not on file  ?Social Connections: Not on file  ?  ? ?Family History: ?The patient's family history includes Cancer in his father and mother; Cervical cancer in his mother; Healthy in his daughter and son; Throat cancer in his father; Thyroid  disease in his son. ? ?ROS:   ?Please see the history of present illness.    ?All other systems reviewed and are negative. ? ?EKGs/Labs/Other Studies Reviewed:   ? ?The following studies were reviewed today: ?Myoview Scan 06/23/19: ?The left ventricular ejection fraction is normal (55-65%). ?Nuclear stress EF: 60%. ?There was no ST segment deviation noted during stress. ?Findings consistent with ischemia. ?This is an intermediate risk study. ?No prior study for comparison. ?  ?Ischemia in mid anterior wall noted in both supine and upright stress images. ? ?EKG:  EKG is ordered today.  The ekg ordered today demonstrates sinus bradycardia 57 bpm, within normal limits. ? ?  Recent Labs: ?06/20/2021: ALT 24; BUN 12; Creatinine, Ser 1.02; Hemoglobin 14.7; Platelets 220; Potassium 4.9; Sodium 138  ?Recent Lipid Panel ?   ?Component Value Date/Time  ? CHOL 115 06/20/2021 0919  ? TRIG 156 (H) 06/20/2021 0919  ? HDL 42 06/20/2021 0919  ? CHOLHDL 2.7 06/20/2021 0919  ? CHOLHDL 3.4 05/07/2015 0834  ? VLDL 16 05/07/2015 0834  ? [place] 47 06/20/2021 0919  ? ? ? ?Risk Assessment/Calculations:   ?  ? ?    ? ?Physical Exam:   ? ?VS:  BP 130/72   Pulse (!) 57   Ht 5' 7.5" (1.715 m)   Wt 264 lb (119.7 kg)   BMI 40.74 kg/m?    ? ?Wt Readings from Last 3 Encounters:  ?07/25/21 264 lb (119.7 kg)  ?07/12/21 267 lb 12.8 oz (121.5 kg)  ?06/28/21 268 lb 3.2 oz (121.7 kg)  ?  ? ?GEN:  Well nourished, well developed in no acute distress ?HEENT: Normal ?NECK: No JVD; No carotid bruits ?LYMPHATICS: No lymphadenopathy ?CARDIAC: RRR, no murmurs, rubs, gallops ?RESPIRATORY:  Clear to auscultation without rales, wheezing or rhonchi  ?ABDOMEN: Soft, non-tender, non-distended ?MUSCULOSKELETAL:  No edema; No deformity  ?SKIN: Warm and dry ?NEUROLOGIC:  Alert and oriented x 3 ?PSYCHIATRIC:  Normal affect  ? ?ASSESSMENT:   ? ?1. Coronary artery disease involving native coronary artery of native heart with angina pectoris ([place])   ?2. Essential  hypertension   ?3. Mixed hyperlipidemia   ?4. Morbid obesity ([place])   ? ?PLAN:   ? ?In order of problems listed above: ? ?The patient's angina is currently well controlled on his medical program which includes isosorbide and metoprolo

## 2021-07-25 NOTE — Patient Instructions (Signed)
Medication Instructions:  Your physician recommends that you continue on your current medications as directed. Please refer to the Current Medication list given to you today.  *If you need a refill on your cardiac medications before your next appointment, please call your pharmacy*   Lab Work: NONE If you have labs (blood work) drawn today and your tests are completely normal, you will receive your results only by: MyChart Message (if you have MyChart) OR A paper copy in the mail If you have any lab test that is abnormal or we need to change your treatment, we will call you to review the results.   Testing/Procedures: NONE   Follow-Up: At [facility], you and your health needs are our priority.  As part of our continuing mission to provide you with exceptional heart care, we have created designated Provider Care Teams.  These Care Teams include your primary Cardiologist (physician) and Advanced Practice Providers (APPs -  Physician Assistants and Nurse Practitioners) who all work together to provide you with the care you need, when you need it.  Your next appointment:   1 year(s)  The format for your next appointment:   In Person  Provider:   Michael Cooper, MD       Important Information About Sugar       

## 2021-07-30 ENCOUNTER — Other Ambulatory Visit: Payer: Self-pay | Admitting: Cardiovascular Disease

## 2021-08-06 ENCOUNTER — Other Ambulatory Visit (INDEPENDENT_AMBULATORY_CARE_PROVIDER_SITE_OTHER): Payer: Self-pay

## 2021-08-06 DIAGNOSIS — Z1211 Encounter for screening for malignant neoplasm of colon: Secondary | ICD-10-CM

## 2021-08-10 ENCOUNTER — Other Ambulatory Visit: Payer: Self-pay | Admitting: Cardiovascular Disease

## 2021-08-20 ENCOUNTER — Telehealth (INDEPENDENT_AMBULATORY_CARE_PROVIDER_SITE_OTHER): Payer: Self-pay

## 2021-08-20 ENCOUNTER — Encounter (INDEPENDENT_AMBULATORY_CARE_PROVIDER_SITE_OTHER): Payer: Self-pay

## 2021-08-20 ENCOUNTER — Telehealth: Payer: Self-pay | Admitting: *Deleted

## 2021-08-20 MED ORDER — PEG 3350-KCL-NA BICARB-NACL 420 G PO SOLR: 4000.0000 mL | ORAL | 0 refills | Status: DC

## 2021-08-20 NOTE — Telephone Encounter (Signed)
Maybelline Kolarik Ann Wladyslawa Disbro, CMA  ?

## 2021-08-20 NOTE — Telephone Encounter (Signed)
Yes he is ok to hold plavix as requested, at low risk of CV event for a short period of time while holding plavix. thanks

## 2021-08-20 NOTE — Telephone Encounter (Signed)
   Pre-operative Risk Assessment    Patient Name: [name]  DOB: 06-15-1955 MRN: [id number]      Request for Surgical Clearance    Procedure:   COLONOSCOPY  Date of Surgery:  Clearance 09/17/21                                 Surgeon:  DR. DANIEL CASTANEDA Surgeon's Group or Practice Name:  [facility] Phone number:  [phone] Fax number:  [phone]   Type of Clearance Requested:   - Medical  - Pharmacy:  Hold Clopidogrel (Plavix) x 5 DAYS PRIOR    Type of Anesthesia:  MAC   Additional requests/questions:    Jiles Prows   08/20/2021, 4:20 PM

## 2021-08-20 NOTE — Telephone Encounter (Signed)
Dr. Burt Knack to review if the patient can hold Plavix for 5 days prior to upcoming colonoscopy?    He has a history of CAD s/p CABG in 2006, underwent DES to SVG to diagonal and SVG to RCA in 2015.  Last cardiac catheterization was performed in 2019 with balloon angioplasty to proximal left circumflex artery.  Patient was seen earlier this month by Dr. Burt Knack at which time he was doing well.  Please forward your response to P CV [facility] PREOP

## 2021-08-20 NOTE — Telephone Encounter (Signed)
Ok to schedule.  Thanks,  Gailyn Crook Castaneda Mayorga, MD Gastroenterology and Hepatology [facility]  

## 2021-08-20 NOTE — Telephone Encounter (Signed)
Referring MD/PCP: Lacinda Axon  Procedure: Tcs  Reason/Indication:  Screening   Has patient had this procedure before?  Yes   If so, when, by whom and where? 2006   Is there a family history of colon cancer?  no  Who?  What age when diagnosed?    Is patient diabetic? If yes, Type 1 or Type 2   yes Type 2      Does patient have prosthetic heart valve or mechanical valve?  no  Do you have a pacemaker/defibrillator?  no  Has patient ever had endocarditis/atrial fibrillation? no  Does patient use oxygen? no  Has patient had joint replacement within last 12 months?  no  Is patient constipated or do they take laxatives? no  Does patient have a history of alcohol/drug use?  no  Have you had a stroke/heart attack last 6 mths? no  Do you take medicine for weight loss?  no  For male patients,: have you had a hysterectomy N/A                      are you post menopausal N/A                      do you still have your menstrual cycle N/A  Is patient on blood thinner such as Coumadin, Plavix and/or Aspirin? Yes  Medications: asa 81 mg daily, Plavix 75 mg daily(Dr Burt Knack), colchicine 0.6 mg prn, isosorbide 60 mg daily, Metformin 500 mg bid, metoprolol 25 mg bid, nitroglycerin prn, praluent 75 mg inject  every 14 days, rosuvastatin 40 mg daily, tramcinolone cream prn   Allergies: nkda  Medication Adjustment per Dr Jenetta Downer HOLD PLAVIX 5 [place]  Procedure date & time: 09/17/21 AT 7:30

## 2021-08-21 NOTE — Telephone Encounter (Signed)
    Patient Name: [name]  DOB: December 11, 1955 MRN: [id number]  Primary Cardiologist: [name], MD  Chart reviewed as part of pre-operative protocol coverage. Given past medical history and time since last visit, based on ACC/AHA guidelines, Nasiir L Delduca would be at acceptable risk for the planned procedure without further cardiovascular testing.   Patient may hold Plavix for 5 days prior to the procedure and restart as soon as possible afterward at the discretion of GI physician.  The patient was advised that if he develops new symptoms prior to surgery to contact our office to arrange for a follow-up visit, and he verbalized understanding.  I will route this recommendation to the requesting party via Epic fax function and remove from pre-op pool.  Please call with questions.  Almyra Deforest, [place] 08/21/2021, 8:33 AM

## 2021-09-04 ENCOUNTER — Other Ambulatory Visit: Payer: Self-pay | Admitting: Cardiovascular Disease

## 2021-09-06 ENCOUNTER — Other Ambulatory Visit: Payer: Self-pay | Admitting: Cardiovascular Disease

## 2021-09-11 NOTE — Patient Instructions (Signed)
RANKIN COOLMAN  09/11/2021     '@PREFPERIOPPHARMACY'$ @   Your procedure is scheduled on  09/17/2021.   Report to [facility] at  0600  A.M.   Call this number if you have problems the morning of surgery:  [phone]   Remember:  Follow the diet and prep instructions given to you by the office.    Your last dose of plavix should be on 09/11/2021.    DO NOT take any medications for diabetes the morning of your procedure.    Take these medicines the morning of surgery with A SIP OF WATER                           imdur, metoprolol.     Do not wear jewelry, make-up or nail polish.  Do not wear lotions, powders, or perfumes, or deodorant.  Do not shave 48 hours prior to surgery.  Men may shave face and neck.  Do not bring valuables to the hospital.  [facility] is not responsible for any belongings or valuables.  Contacts, dentures or bridgework may not be worn into surgery.  Leave your suitcase in the car.  After surgery it may be brought to your room.  For patients admitted to the hospital, discharge time will be determined by your treatment team.  Patients discharged the day of surgery will not be allowed to drive home and must have someone with them for 24 hours.    Special instructions:   DO NOT smoke tobacco or vape for 24 hours before your procedure.  Please read over the following fact sheets that you were given. Anesthesia Post-op Instructions and Care and Recovery After Surgery      Colonoscopy, Adult, Care After The following information offers guidance on how to care for yourself after your procedure. Your health care provider may also give you more specific instructions. If you have problems or questions, contact your health care provider. What can I expect after the procedure? After the procedure, it is common to have: A small amount of blood in your stool for 24 hours after the procedure. Some gas. Mild cramping or bloating of your  abdomen. Follow these instructions at home: Eating and drinking  Drink enough fluid to keep your urine pale yellow. Follow instructions from your health care provider about eating or drinking restrictions. Resume your normal diet as told by your health care provider. Avoid heavy or fried foods that are hard to digest. Activity Rest as told by your health care provider. Avoid sitting for a long time without moving. Get up to take short walks every 1-2 hours. This is important to improve blood flow and breathing. Ask for help if you feel weak or unsteady. Return to your normal activities as told by your health care provider. Ask your health care provider what activities are safe for you. Managing cramping and bloating  Try walking around when you have cramps or feel bloated. If directed, apply heat to your abdomen as told by your health care provider. Use the heat source that your health care provider recommends, such as a moist heat pack or a heating pad. Place a towel between your skin and the heat source. Leave the heat on for 20-30 minutes. Remove the heat if your skin turns bright red. This is especially important if you are unable to feel pain, heat, or cold. You have a greater risk of getting burned.  General instructions If you were given a sedative during the procedure, it can affect you for several hours. Do not drive or operate machinery until your health care provider says that it is safe. For the first 24 hours after the procedure: Do not sign important documents. Do not drink alcohol. Do your regular daily activities at a slower pace than normal. Eat soft foods that are easy to digest. Take over-the-counter and prescription medicines only as told by your health care provider. Keep all follow-up visits. This is important. Contact a health care provider if: You have blood in your stool 2-3 days after the procedure. Get help right away if: You have more than a small spotting of  blood in your stool. You have large blood clots in your stool. You have swelling of your abdomen. You have nausea or vomiting. You have a fever. You have increasing pain in your abdomen that is not relieved with medicine. These symptoms may be an emergency. Get help right away. Call 911. Do not wait to see if the symptoms will go away. Do not drive yourself to the hospital. Summary After the procedure, it is common to have a small amount of blood in your stool. You may also have mild cramping and bloating of your abdomen. If you were given a sedative during the procedure, it can affect you for several hours. Do not drive or operate machinery until your health care provider says that it is safe. Get help right away if you have a lot of blood in your stool, nausea or vomiting, a fever, or increased pain in your abdomen. This information is not intended to replace advice given to you by your health care provider. Make sure you discuss any questions you have with your health care provider. Document Revised: 10/31/2020 Document Reviewed: 10/31/2020 Elsevier Patient Education  [place] After This sheet gives you information about how to care for yourself after your procedure. Your health care provider may also give you more specific instructions. If you have problems or questions, contact your health care provider. What can I expect after the procedure? After the procedure, it is common to have: Tiredness. Forgetfulness about what happened after the procedure. Impaired judgment for important decisions. Nausea or vomiting. Some difficulty with balance. Follow these instructions at home: For the time period you were told by your health care provider:     Rest as needed. Do not participate in activities where you could fall or become injured. Do not drive or use machinery. Do not drink alcohol. Do not take sleeping pills or medicines that cause  drowsiness. Do not make important decisions or sign legal documents. Do not take care of children on your own. Eating and drinking Follow the diet that is recommended by your health care provider. Drink enough fluid to keep your urine pale yellow. If you vomit: Drink water, juice, or soup when you can drink without vomiting. Make sure you have little or no nausea before eating solid foods. General instructions Have a responsible adult stay with you for the time you are told. It is important to have someone help care for you until you are awake and alert. Take over-the-counter and prescription medicines only as told by your health care provider. If you have sleep apnea, surgery and certain medicines can increase your risk for breathing problems. Follow instructions from your health care provider about wearing your sleep device: Anytime you are sleeping, including during daytime naps. While taking  prescription pain medicines, sleeping medicines, or medicines that make you drowsy. Avoid smoking. Keep all follow-up visits as told by your health care provider. This is important. Contact a health care provider if: You keep feeling nauseous or you keep vomiting. You feel light-headed. You are still sleepy or having trouble with balance after 24 hours. You develop a rash. You have a fever. You have redness or swelling around the IV site. Get help right away if: You have trouble breathing. You have new-onset confusion at home. Summary For several hours after your procedure, you may feel tired. You may also be forgetful and have poor judgment. Have a responsible adult stay with you for the time you are told. It is important to have someone help care for you until you are awake and alert. Rest as told. Do not drive or operate machinery. Do not drink alcohol or take sleeping pills. Get help right away if you have trouble breathing, or if you suddenly become confused. This information is not  intended to replace advice given to you by your health care provider. Make sure you discuss any questions you have with your health care provider. Document Revised: 02/12/2021 Document Reviewed: 02/10/2019 Elsevier Patient Education  [place].

## 2021-09-13 ENCOUNTER — Encounter (HOSPITAL_COMMUNITY)
Admission: RE | Admit: 2021-09-13 | Discharge: 2021-09-13 | Disposition: A | Discharge status: Home / Self Care | Payer: Medicare Other | Source: Ambulatory Visit | Attending: Gastroenterology | Admitting: Gastroenterology

## 2021-09-13 DIAGNOSIS — E1165 Type 2 diabetes mellitus with hyperglycemia: Secondary | ICD-10-CM | POA: Insufficient documentation

## 2021-09-13 DIAGNOSIS — Z1211 Encounter for screening for malignant neoplasm of colon: Secondary | ICD-10-CM

## 2021-09-13 DIAGNOSIS — Z01812 Encounter for preprocedural laboratory examination: Secondary | ICD-10-CM | POA: Insufficient documentation

## 2021-09-13 LAB — BASIC METABOLIC PANEL
Anion gap: 6 (ref 5–15)
BUN: 13 mg/dL (ref 8–23)
CO2: 27 mmol/L (ref 22–32)
Calcium: 9.3 mg/dL (ref 8.9–10.3)
Chloride: 105 mmol/L (ref 98–111)
Creatinine, Ser: 0.87 mg/dL (ref 0.61–1.24)
GFR, Estimated: >60 mL/min (ref >60)
Glucose, Bld: 140 mg/dL — ABNORMAL HIGH (ref 70–99)
Potassium: 4.2 mmol/L (ref 3.5–5.1)
Sodium: 138 mmol/L (ref 135–145)

## 2021-09-17 ENCOUNTER — Ambulatory Visit (HOSPITAL_COMMUNITY): Payer: Medicare Other | Admitting: Anesthesiology

## 2021-09-17 ENCOUNTER — Encounter (HOSPITAL_COMMUNITY)
Admission: RE | Disposition: A | Discharge status: Home / Self Care | Payer: Self-pay | Source: Home / Self Care | Attending: Gastroenterology

## 2021-09-17 ENCOUNTER — Encounter (HOSPITAL_COMMUNITY): Payer: Self-pay | Admitting: Gastroenterology

## 2021-09-17 ENCOUNTER — Ambulatory Visit (HOSPITAL_COMMUNITY)
Admission: RE | Admit: 2021-09-17 | Discharge: 2021-09-17 | Disposition: A | Discharge status: Home / Self Care | Payer: Medicare Other | Attending: Gastroenterology | Admitting: Gastroenterology

## 2021-09-17 ENCOUNTER — Ambulatory Visit (HOSPITAL_BASED_OUTPATIENT_CLINIC_OR_DEPARTMENT_OTHER): Payer: Medicare Other | Admitting: Anesthesiology

## 2021-09-17 DIAGNOSIS — D122 Benign neoplasm of ascending colon: Secondary | ICD-10-CM | POA: Insufficient documentation

## 2021-09-17 DIAGNOSIS — D123 Benign neoplasm of transverse colon: Secondary | ICD-10-CM | POA: Insufficient documentation

## 2021-09-17 DIAGNOSIS — Z7984 Long term (current) use of oral hypoglycemic drugs: Secondary | ICD-10-CM

## 2021-09-17 DIAGNOSIS — I2510 Atherosclerotic heart disease of native coronary artery without angina pectoris: Secondary | ICD-10-CM | POA: Insufficient documentation

## 2021-09-17 DIAGNOSIS — D120 Benign neoplasm of cecum: Secondary | ICD-10-CM | POA: Insufficient documentation

## 2021-09-17 DIAGNOSIS — I10 Essential (primary) hypertension: Secondary | ICD-10-CM | POA: Diagnosis not present

## 2021-09-17 DIAGNOSIS — Z1211 Encounter for screening for malignant neoplasm of colon: Secondary | ICD-10-CM | POA: Diagnosis not present

## 2021-09-17 DIAGNOSIS — E119 Type 2 diabetes mellitus without complications: Secondary | ICD-10-CM | POA: Diagnosis not present

## 2021-09-17 DIAGNOSIS — K635 Polyp of colon: Secondary | ICD-10-CM

## 2021-09-17 DIAGNOSIS — E785 Hyperlipidemia, unspecified: Secondary | ICD-10-CM | POA: Diagnosis not present

## 2021-09-17 DIAGNOSIS — D128 Benign neoplasm of rectum: Secondary | ICD-10-CM | POA: Diagnosis not present

## 2021-09-17 DIAGNOSIS — K621 Rectal polyp: Secondary | ICD-10-CM | POA: Diagnosis not present

## 2021-09-17 DIAGNOSIS — K5730 Diverticulosis of large intestine without perforation or abscess without bleeding: Secondary | ICD-10-CM

## 2021-09-17 DIAGNOSIS — I252 Old myocardial infarction: Secondary | ICD-10-CM | POA: Diagnosis not present

## 2021-09-17 DIAGNOSIS — K219 Gastro-esophageal reflux disease without esophagitis: Secondary | ICD-10-CM | POA: Diagnosis not present

## 2021-09-17 DIAGNOSIS — D124 Benign neoplasm of descending colon: Secondary | ICD-10-CM | POA: Insufficient documentation

## 2021-09-17 DIAGNOSIS — E1165 Type 2 diabetes mellitus with hyperglycemia: Secondary | ICD-10-CM

## 2021-09-17 HISTORY — PX: COLONOSCOPY WITH PROPOFOL: SHX5780

## 2021-09-17 HISTORY — PX: POLYPECTOMY: SHX5525

## 2021-09-17 HISTORY — PX: BIOPSY: SHX5522

## 2021-09-17 HISTORY — PX: HEMOSTASIS CLIP PLACEMENT: SHX6857

## 2021-09-17 LAB — GLUCOSE, CAPILLARY: Glucose-Capillary: 120 mg/dL — ABNORMAL HIGH (ref 70–99)

## 2021-09-17 LAB — HM COLONOSCOPY

## 2021-09-17 SURGERY — COLONOSCOPY WITH PROPOFOL
Anesthesia: General

## 2021-09-17 MED ORDER — PROPOFOL 500 MG/50ML IV EMUL
INTRAVENOUS | Status: DC | PRN
  Administered 2021-09-17: 100 ug/kg/min via INTRAVENOUS
  Administered 2021-09-17: 70 ug/kg/min via INTRAVENOUS

## 2021-09-17 MED ORDER — PROPOFOL 10 MG/ML IV BOLUS
INTRAVENOUS | Status: DC | PRN
  Administered 2021-09-17: 20 mg via INTRAVENOUS
  Administered 2021-09-17: 160 mg via INTRAVENOUS
  Administered 2021-09-17: 20 mg via INTRAVENOUS

## 2021-09-17 MED ORDER — LACTATED RINGERS IV SOLN: INTRAVENOUS | Status: DC

## 2021-09-17 MED ORDER — EPHEDRINE SULFATE (PRESSORS) 50 MG/ML IJ SOLN
INTRAMUSCULAR | Status: DC | PRN
  Administered 2021-09-17 (×4): 5 mg via INTRAVENOUS

## 2021-09-18 ENCOUNTER — Encounter (INDEPENDENT_AMBULATORY_CARE_PROVIDER_SITE_OTHER): Payer: Self-pay | Admitting: *Deleted

## 2021-09-18 LAB — SURGICAL PATHOLOGY

## 2021-09-18 NOTE — Anesthesia Postprocedure Evaluation (Signed)
Anesthesia Post Note  Patient: [name]  Procedure(s) Performed: COLONOSCOPY WITH PROPOFOL POLYPECTOMY BIOPSY HEMOSTASIS CLIP PLACEMENT  Patient location during evaluation: Phase II Anesthesia Type: General Level of consciousness: awake Pain management: pain level controlled Vital Signs Assessment: post-procedure vital signs reviewed and stable Respiratory status: spontaneous breathing and respiratory function stable Cardiovascular status: blood pressure returned to baseline and stable Postop Assessment: no headache and no apparent nausea or vomiting Anesthetic complications: no Comments: Late entry   No notable events documented.   Last Vitals:  Vitals:   09/17/21 0700 09/17/21 0829  BP:  (!) 98/45  Pulse: (!) 54 62  Resp: 14 18  Temp:  (!) 36.3 C  SpO2: 96% 96%    Last Pain:  Vitals:   09/17/21 0829  TempSrc: Oral  PainSc: 0-No pain                 Louann Sjogren

## 2021-09-20 ENCOUNTER — Ambulatory Visit (INDEPENDENT_AMBULATORY_CARE_PROVIDER_SITE_OTHER): Payer: Medicare Other | Admitting: Family Medicine

## 2021-09-20 ENCOUNTER — Encounter: Payer: Self-pay | Admitting: Family Medicine

## 2021-09-20 VITALS — BP 165/69 | HR 51 | Temp 97.9°F | Wt 266.8 lb

## 2021-09-20 DIAGNOSIS — I25119 Atherosclerotic heart disease of native coronary artery with unspecified angina pectoris: Secondary | ICD-10-CM | POA: Diagnosis not present

## 2021-09-20 DIAGNOSIS — E1165 Type 2 diabetes mellitus with hyperglycemia: Secondary | ICD-10-CM

## 2021-09-20 DIAGNOSIS — I10 Essential (primary) hypertension: Secondary | ICD-10-CM

## 2021-09-20 DIAGNOSIS — E782 Mixed hyperlipidemia: Secondary | ICD-10-CM | POA: Diagnosis not present

## 2021-09-20 MED ORDER — METFORMIN HCL ER 500 MG PO TB24: 1000.0000 mg | ORAL_TABLET | Freq: Every day | ORAL | 1 refills | Status: DC

## 2021-09-20 NOTE — Assessment & Plan Note (Signed)
Uncontrolled.  Switching metformin to XR formulation due to reported headaches.  We discussed GLP-1's today. Awaiting A1c.  I anticipate that his A1c is not at goal and that we will start GLP-1 after his A1c returns.

## 2021-09-20 NOTE — Assessment & Plan Note (Signed)
Stable.  Continue Crestor and Praluent.

## 2021-09-20 NOTE — Progress Notes (Signed)
Subjective:  Patient ID: [name], male    DOB: December 18, 1955  Age: 66 y.o. MRN: [id number]  CC: Chief Complaint  Patient presents with   Diabetes    Metformin causes headache for patient; pharmacy recommended talking to PCP about XR.    Hypertension    No issues. Did not take blood pressure a few days last week due to colonoscopy.    HPI:  66 year old male with hypertension, coronary disease, hepatic steatosis, GERD, type 2 diabetes, obesity, hyperlipidemia presents for follow-up.  Patient states that his blood pressures are well controlled except when he is in the office.  Every time he has checked it outside of the office it has been below 130/80.  He endorses compliance with metoprolol.  He is also on Imdur.  Patient is currently taking metformin 500 mg twice a day regarding his diabetes.  Last A1c was 11.4.  He has been seen by endocrinology.  He has not been put on any other medication.  He has made some lifestyle changes.  He is in need of A1c.  Patient states that he is concerned that he is having a side effect from the metformin.  He states that he is having ongoing headaches which occur after he takes his metformin.  He is interested in switching to the XR formulation after talking with his pharmacist.  Patient's LDL is at goal.  Needs better control of triglycerides but this is likely due to his diabetes.  This should resolve following better glycemic control.  He is currently over max dose Crestor and Praluent.  Patient Active Problem List   Diagnosis Date Noted   Mixed hyperlipidemia 06/20/2021   History of gout 06/20/2021   Type 2 diabetes mellitus with hyperglycemia ([place]) 06/20/2021   Coronary artery disease involving native coronary artery with angina pectoris ([place])    Obesity    GERD (gastroesophageal reflux disease) 07/12/2012   Fatty liver 07/07/2011   Essential hypertension 08/22/2008    Social Hx   Social History   Socioeconomic History   Marital  status: Divorced    Spouse name: Not on file   Number of children: Not on file   Years of education: Not on file   Highest education level: Not on file  Occupational History   Not on file  Tobacco Use   Smoking status: Never   Smokeless tobacco: Never  Vaping Use   Vaping Use: Never used  Substance and Sexual Activity   Alcohol use: No   Drug use: No   Sexual activity: Yes    Birth control/protection: None  Other Topics Concern   Not on file  Social History Narrative   Not on file   Social Determinants of Health   Financial Resource Strain: Not on file  Food Insecurity: Not on file  Transportation Needs: Not on file  Physical Activity: Not on file  Stress: Not on file  Social Connections: Not on file    Review of Systems Per HPI  Objective:  BP (!) 165/69   Pulse (!) 51   Temp 97.9 F (36.6 C)   Wt 266 lb 12.8 oz (121 kg)   SpO2 97%   BMI 39.40 kg/m      09/20/2021    8:39 AM 09/17/2021    8:29 AM 09/17/2021    6:57 AM  BP/Weight  Systolic BP 235 98 573  Diastolic BP 69 45 70  Wt. (Lbs) 266.8    BMI 39.4 kg/m2  Physical Exam Constitutional:      General: He is not in acute distress.    Appearance: Normal appearance. He is obese.  HENT:     Head: Normocephalic and atraumatic.  Eyes:     General:        Right eye: No discharge.        Left eye: No discharge.     Conjunctiva/sclera: Conjunctivae normal.  Cardiovascular:     Rate and Rhythm: Regular rhythm. Bradycardia present.  Pulmonary:     Effort: Pulmonary effort is normal.     Breath sounds: Normal breath sounds. No wheezing, rhonchi or rales.  Neurological:     Mental Status: He is alert.  Psychiatric:        Mood and Affect: Mood normal.        Behavior: Behavior normal.     Lab Results  Component Value Date   WBC 7.4 06/20/2021   HGB 14.7 06/20/2021   HCT 44.2 06/20/2021   PLT 220 06/20/2021   GLUCOSE 140 (H) 09/13/2021   CHOL 115 06/20/2021   TRIG 156 (H) 06/20/2021    HDL 42 06/20/2021   LDLCALC 47 06/20/2021   ALT 24 06/20/2021   AST 27 06/20/2021   NA 138 09/13/2021   K 4.2 09/13/2021   CL 105 09/13/2021   CREATININE 0.87 09/13/2021   BUN 13 09/13/2021   CO2 27 09/13/2021   TSH 2.270 06/27/2020   INR 1.05 06/06/2013   HGBA1C 11.4 (H) 06/20/2021     Assessment & Plan:   Problem List Items Addressed This Visit       Cardiovascular and Mediastinum   Essential hypertension    BP elevated here today.  Advised continued compliance with current medication.  He is to take his blood pressure at home daily and send me the readings.        Endocrine   Type 2 diabetes mellitus with hyperglycemia ([place]) - Primary    Uncontrolled.  Switching metformin to XR formulation due to reported headaches.  We discussed GLP-1's today. Awaiting A1c.  I anticipate that his A1c is not at goal and that we will start GLP-1 after his A1c returns.      Relevant Medications   metFORMIN (GLUCOPHAGE-XR) 500 MG 24 hr tablet   Other Relevant Orders   Hemoglobin A1c     Other   Mixed hyperlipidemia    Stable.  Continue Crestor and Praluent.       Meds ordered this encounter  Medications   metFORMIN (GLUCOPHAGE-XR) 500 MG 24 hr tablet    Sig: Take 2 tablets (1,000 mg total) by mouth daily with breakfast.    Dispense:  180 tablet    Refill:  1    Follow-up:  Return in about 3 months (around 12/21/2021).  [place]

## 2021-09-20 NOTE — Patient Instructions (Signed)
Check BP @ home.  A1C today.  I have switched the metformin.  Follow up in 3 months.

## 2021-09-20 NOTE — Assessment & Plan Note (Signed)
BP elevated here today.  Advised continued compliance with current medication.  He is to take his blood pressure at home daily and send me the readings.

## 2021-09-21 LAB — HEMOGLOBIN A1C
Est. average glucose Bld gHb Est-mCnc: 180 mg/dL
Hgb A1c MFr Bld: 7.9 % — ABNORMAL HIGH (ref 4.8–5.6)

## 2021-09-23 ENCOUNTER — Encounter (HOSPITAL_COMMUNITY): Payer: Self-pay | Admitting: Gastroenterology

## 2021-09-23 ENCOUNTER — Telehealth: Payer: Self-pay | Admitting: *Deleted

## 2021-09-23 NOTE — Telephone Encounter (Signed)
Patient called back and stated he changed his mind he will try the injection for his diabetes.   [facility]

## 2021-09-24 ENCOUNTER — Other Ambulatory Visit: Payer: Self-pay | Admitting: Family Medicine

## 2021-09-24 MED ORDER — SEMAGLUTIDE(0.25 OR 0.5MG/DOS) 2 MG/3ML ~~LOC~~ SOPN: 0.2500 mg | PEN_INJECTOR | SUBCUTANEOUS | 0 refills | Status: DC

## 2021-09-25 NOTE — Telephone Encounter (Signed)
Patient notified

## 2021-09-25 NOTE — Telephone Encounter (Signed)
Coral Spikes, DO     Rx sent for Ozempic.

## 2021-09-28 ENCOUNTER — Other Ambulatory Visit: Payer: Self-pay | Admitting: Cardiovascular Disease

## 2021-09-28 DIAGNOSIS — I25119 Atherosclerotic heart disease of native coronary artery with unspecified angina pectoris: Secondary | ICD-10-CM

## 2021-09-28 DIAGNOSIS — I214 Non-ST elevation (NSTEMI) myocardial infarction: Secondary | ICD-10-CM

## 2021-09-28 DIAGNOSIS — E782 Mixed hyperlipidemia: Secondary | ICD-10-CM

## 2021-10-07 ENCOUNTER — Ambulatory Visit: Payer: Medicare Other | Admitting: Cardiovascular Disease

## 2021-10-15 ENCOUNTER — Encounter: Payer: Self-pay | Admitting: Nurse Practitioner

## 2021-10-15 ENCOUNTER — Ambulatory Visit (INDEPENDENT_AMBULATORY_CARE_PROVIDER_SITE_OTHER): Payer: Medicare Other | Admitting: Nurse Practitioner

## 2021-10-15 VITALS — BP 129/78 | HR 66 | Ht 67.5 in | Wt 255.0 lb

## 2021-10-15 DIAGNOSIS — E782 Mixed hyperlipidemia: Secondary | ICD-10-CM

## 2021-10-15 DIAGNOSIS — E1165 Type 2 diabetes mellitus with hyperglycemia: Secondary | ICD-10-CM | POA: Diagnosis not present

## 2021-10-15 DIAGNOSIS — I10 Essential (primary) hypertension: Secondary | ICD-10-CM

## 2021-10-15 MED ORDER — ONETOUCH DELICA PLUS LANCET33G MISC: 12 refills | Status: DC

## 2021-10-15 NOTE — Progress Notes (Signed)
Endocrinology Follow Up Note       10/15/2021, 12:04 PM   Subjective:    Patient ID: [name], male    DOB: 09-23-1955.  [name] is being seen in follow up after being seen in consultation for management of currently uncontrolled symptomatic diabetes requested by  Coral Spikes, DO.   Past Medical History:  Diagnosis Date   Arthritis    CAD (coronary artery disease) 2000   a. History of MI ~2000 s/p PCI. b. CABG in 2006 (LIMA-LAD, RIMA-acute marginal, SVG-DX, SVG-RCA). c. NSTEMI 05/2013:severe stenoses of the SVG-PDA and SVG-diagonal, both treated successfully with PCI (DES x2).   Diabetes ([place])    GERD (gastroesophageal reflux disease)    Gout    History of hypertension    Hydrocele 2012   Hyperlipidemia    Hypertension    Myocardial infarction [facility]) 2014   x3   Obesity    S/P coronary angioplasty 11/10/17 11/11/2017    Past Surgical History:  Procedure Laterality Date   BIOPSY  09/17/2021   Procedure: BIOPSY;  Surgeon: Harvel Quale, MD;  Location[facility];  Service: Gastroenterology;;   CATARACT EXTRACTION W/PHACO Left 05/17/2012   Procedure: CATARACT EXTRACTION PHACO AND INTRAOCULAR LENS PLACEMENT ([place]);  Surgeon: Tonny Branch, MD;  Location: [facility] ORS;  Service: Ophthalmology;  Laterality: Left;  CDE:  0.76   CATARACT EXTRACTION W/PHACO Right 08/26/2012   Procedure: CATARACT EXTRACTION PHACO AND INTRAOCULAR LENS PLACEMENT ([facility]);  Surgeon: Tonny Branch, MD;  Location: [facility] OR[facility];  Service: Ophthalmology;  Laterality: Right;  CDE: 1.13   COLONOSCOPY WITH PROPOFOL N/A 09/17/2021   Procedure: COLONOSCOPY WITH PROPOFOL;  Surgeon: Harvel Quale, MD;  Location: [facility];  Service: Gastroenterology;  Laterality: N/A;  [place]   stents x2   CORONARY ARTERY BYPASS GRAFT  2006   4 vessels   Coronary artery bypass grafting  May 2006   Four-vessel    CORONARY BALLOON ANGIOPLASTY N/A 11/10/2017   Procedure: CORONARY BALLOON ANGIOPLASTY;  Surgeon: [name], MD;  Location: [place] CV LAB;  Service: Cardiovascular;  Laterality: N/A;   FOOT FRACTURE SURGERY     right   HEMOSTASIS CLIP PLACEMENT  09/17/2021   Procedure: HEMOSTASIS CLIP PLACEMENT;  Surgeon: Harvel Quale, MD;  [facility];  Service: Gastroenterology;;   LEFT HEART CATH AND CORS/GRAFTS ANGIOGRAPHY N/A 11/10/2017   Procedure: LEFT HEART CATH AND CORS/GRAFTS ANGIOGRAPHY;  Surgeon: [name], MD;  Location: [place] CV LAB;  Service: Cardiovascular;  Laterality: N/A;   LEFT HEART CATHETERIZATION WITH CORONARY/GRAFT ANGIOGRAM N/A 05/05/2011   Procedure: LEFT HEART CATHETERIZATION WITH Beatrix Fetters;  Surgeon: Josue Hector, MD;  Location: [facility] CATH LAB;  Service: Cardiovascular;  Laterality: N/A;   LEFT HEART CATHETERIZATION WITH CORONARY/GRAFT ANGIOGRAM N/A 06/06/2013   Procedure: LEFT HEART CATHETERIZATION WITH Beatrix Fetters;  Surgeon: Blane Ohara, MD;  Location: [facility] CATH LAB;  Service: Cardiovascular;  Laterality: N/A;   MASS EXCISION Right 05/14/2016   Procedure: EXCISION MASS RIGHT ELBOW;  Surgeon: Carole Civil, MD;  Location: [facility];  Service: Orthopedics;  Laterality: Right;   PILONIDAL CYST  EXCISION     POLYPECTOMY  09/17/2021   Procedure: POLYPECTOMY;  Surgeon: Montez Morita, Quillian Quince, MD;  [facility];  Service: Gastroenterology;;   TONSILLECTOMY      Social History   Socioeconomic History   Marital status: Divorced    Spouse name: Not on file   Number of children: Not on file   Years of education: Not on file   Highest education level: Not on file  Occupational History   Not on file  Tobacco Use   Smoking status: Never   Smokeless tobacco: Never  Vaping Use   Vaping Use: Never used  Substance and Sexual Activity   Alcohol use: No   Drug use: No   Sexual activity: Yes    Birth  control/protection: None  Other Topics Concern   Not on file  Social History Narrative   Not on file   Social Determinants of Health   Financial Resource Strain: Not on file  Food Insecurity: Not on file  Transportation Needs: Not on file  Physical Activity: Not on file  Stress: Not on file  Social Connections: Not on file    Family History  Problem Relation Age of Onset   Cancer Mother    Cervical cancer Mother    Cancer Father    Throat cancer Father    Thyroid disease Son    Healthy Daughter    Healthy Son     Outpatient Encounter Medications as of 10/15/2021  Medication Sig   Alirocumab (PRALUENT) 75 MG/ML SOAJ INJECT 1 ML UNDER THE SKIN EVERY 14 DAYS   aspirin EC 81 MG tablet Take 81 mg by mouth daily.   clopidogrel (PLAVIX) 75 MG tablet TAKE 1 TABLET BY MOUTH EVERY DAY   colchicine 0.6 MG tablet Take 0.6 mg by mouth daily as needed (gout flares).   glucose blood (ONETOUCH VERIO) test strip Use to monitor glucose twice daily.   isosorbide mononitrate (IMDUR) 60 MG 24 hr tablet TAKE 1.5 TABLETS (90 MG TOTAL) BY MOUTH DAILY. (Patient taking differently: Take 60 mg by mouth daily.)   Lancets (ONETOUCH DELICA PLUS [id number]) MISC Use to monitor glucose twice daily.   metFORMIN (GLUCOPHAGE-XR) 500 MG 24 hr tablet Take 2 tablets (1,000 mg total) by mouth daily with breakfast.   metoprolol tartrate (LOPRESSOR) 25 MG tablet TAKE 1 TABLET BY MOUTH TWICE A DAY   nitroGLYCERIN (NITROSTAT) 0.4 MG SL tablet PLACE 1 TABLET UNDER THE TONGUE EVERY 5 MINUTES AS NEEDED FOR CHEST PAIN.   rosuvastatin (CRESTOR) 40 MG tablet TAKE 1 TABLET BY MOUTH EVERY DAY   Semaglutide,0.25 or 0.'5MG'$ /DOS, 2 MG/3ML SOPN Inject 0.25 mg into the skin once a week. After 4 weeks, increase to 0.5 mg weekly.   [DISCONTINUED] Lancets (ONETOUCH DELICA PLUS [id number]) MISC Use to monitor glucose twice daily.   No facility-administered encounter medications on file as of 10/15/2021.    ALLERGIES: Allergies   Allergen Reactions   Hydrocodone Nausea Only    VACCINATION STATUS: Immunization History  Administered Date(s) Administered   Influenza,trivalent, recombinat, inj, PF 01/09/2017   Influenza-Unspecified 01/22/2018   Moderna Sars-Covid-2 Vaccination 05/20/2019, 06/21/2019   PNEUMOCOCCAL CONJUGATE-20 06/20/2021   Tdap 03/24/2014    Diabetes He presents for his follow-up diabetic visit. He has type 2 diabetes mellitus. Onset time: Recently diagnosed at age 66. His disease course has been improving. There are no hypoglycemic associated symptoms. Associated symptoms include blurred vision, fatigue and weight loss. Pertinent negatives for diabetes include no polydipsia and no  polyuria. There are no hypoglycemic complications. Symptoms are improving. Diabetic complications include heart disease and nephropathy. Risk factors for coronary artery disease include diabetes mellitus, dyslipidemia, family history, obesity, male sex, hypertension and sedentary lifestyle. Current diabetic treatment includes oral agent (monotherapy) (and Ozempic). He is compliant with treatment most of the time. His weight is decreasing steadily. He is following a generally healthy diet. When asked about meal planning, he reported none. He has not had a previous visit with a dietitian. He never participates in exercise. His home blood glucose trend is decreasing steadily. His breakfast blood glucose range is generally 110-130 mg/dl. His bedtime blood glucose range is generally 140-180 mg/dl. (He presents today with his meter and logs showing at goal glycemic profile overall.  His previsit A1c, checked by his PCP on 6/30 was 7.9%, improving drastically from last visit of 11.4%.  He notes his PCP recently started him on Ozempic 0.25 mg weekly a 4 weeks, then to increase to 0.5 mg SQ weekly.  He was also changed to ER form of Metformin to help with GI upset.  He denies any hypoglycemia.) An ACE inhibitor/angiotensin II receptor blocker  is not being taken. He sees a podiatrist.Eye exam is current.  Hypertension This is a chronic problem. The current episode started more than 1 year ago. The problem has been resolved since onset. The problem is controlled. Associated symptoms include blurred vision. There are no associated agents to hypertension. Risk factors for coronary artery disease include diabetes mellitus, dyslipidemia, family history, obesity and male gender. Past treatments include beta blockers. The current treatment provides mild improvement. Compliance problems include diet and exercise.  Hypertensive end-organ damage includes kidney disease and CAD/MI. Identifiable causes of hypertension include chronic renal disease.  Hyperlipidemia This is a chronic problem. The current episode started more than 1 year ago. The problem is controlled. Recent lipid tests were reviewed and are normal. Exacerbating diseases include chronic renal disease and diabetes. Factors aggravating his hyperlipidemia include fatty foods and beta blockers. Current antihyperlipidemic treatment includes statins. The current treatment provides moderate improvement of lipids. Compliance problems include adherence to diet and adherence to exercise.  Risk factors for coronary artery disease include diabetes mellitus, dyslipidemia, family history, obesity, male sex and hypertension.     Review of systems  Constitutional: + steadily decreasing body weight,  current Body mass index is 39.35 kg/m. , no fatigue, no subjective hyperthermia, no subjective hypothermia Eyes: no blurry vision, no xerophthalmia ENT: no sore throat, no nodules palpated in throat, no dysphagia/odynophagia, no hoarseness Cardiovascular: no chest pain, no shortness of breath, no palpitations, no leg swelling Respiratory: no cough, no shortness of breath Gastrointestinal: no nausea/vomiting/diarrhea Musculoskeletal: no muscle/joint aches Skin: no rashes, no hyperemia Neurological: no  tremors, no numbness, no tingling, no dizziness Psychiatric: no depression, no anxiety  Objective:     BP 129/78   Pulse 66   Ht 5' 7.5" (1.715 m)   Wt 255 lb (115.7 kg)   BMI 39.35 kg/m   Wt Readings from Last 3 Encounters:  10/15/21 255 lb (115.7 kg)  09/20/21 266 lb 12.8 oz (121 kg)  09/13/21 263 lb (119.3 kg)     BP Readings from Last 3 Encounters:  10/15/21 129/78  09/20/21 (!) 165/69  09/17/21 (!) 98/45     Physical Exam- Limited  Constitutional:  Body mass index is 39.35 kg/m. , not in acute distress, normal state of mind Eyes:  EOMI, no exophthalmos Neck: Supple Cardiovascular: RRR, no murmurs, rubs, or  gallops, no edema Respiratory: Adequate breathing efforts, no crackles, rales, rhonchi, or wheezing Musculoskeletal: no gross deformities, strength intact in all four extremities, no gross restriction of joint movements Skin:  no rashes, no hyperemia Neurological: no tremor with outstretched hands   Diabetic Foot Exam - Simple   Simple Foot Form Diabetic Foot exam was performed with the following findings: Yes 10/15/2021 12:03 PM  Visual Inspection No deformities, no ulcerations, no other skin breakdown bilaterally: Yes Sensation Testing Intact to touch and monofilament testing bilaterally: Yes Pulse Check Posterior Tibialis and Dorsalis pulse intact bilaterally: Yes Comments    CMP ( most recent) CMP     Component Value Date/Time   NA 138 09/13/2021 0919   NA 138 06/20/2021 0919   K 4.2 09/13/2021 0919   CL 105 09/13/2021 0919   CO2 27 09/13/2021 0919   GLUCOSE 140 (H) 09/13/2021 0919   BUN 13 09/13/2021 0919   BUN 12 06/20/2021 0919   CREATININE 0.87 09/13/2021 0919   CREATININE 1.00 05/07/2015 0834   CALCIUM 9.3 09/13/2021 0919   PROT 7.4 06/20/2021 0919   ALBUMIN 5.0 (H) 06/20/2021 0919   AST 27 06/20/2021 0919   ALT 24 06/20/2021 0919   ALKPHOS 116 06/20/2021 0919   BILITOT 1.1 06/20/2021 0919   GFRNONAA >60 09/13/2021 0919   GFRAA 88  11/16/2018 1027     Diabetic Labs (most recent): Lab Results  Component Value Date   HGBA1C 7.9 (H) 09/20/2021   HGBA1C 11.4 (H) 06/20/2021   HGBA1C 8.7 (H) 06/27/2020     Lipid Panel ( most recent) Lipid Panel     Component Value Date/Time   CHOL 115 06/20/2021 0919   TRIG 156 (H) 06/20/2021 0919   HDL 42 06/20/2021 0919   CHOLHDL 2.7 06/20/2021 0919   CHOLHDL 3.4 05/07/2015 0834   VLDL 16 05/07/2015 0834   LDLCALC 47 06/20/2021 0919   LABVLDL 26 06/20/2021 0919      Lab Results  Component Value Date   TSH 2.270 06/27/2020   TSH 1.478 05/03/2011           Assessment & Plan:   1) Type 2 diabetes mellitus with hyperglycemia, without long-term current use of insulin ([place])  He presents today with his meter and logs showing at goal glycemic profile overall.  His previsit A1c, checked by his PCP on 6/30 was 7.9%, improving drastically from last visit of 11.4%.  He notes his PCP recently started him on Ozempic 0.25 mg weekly a 4 weeks, then to increase to 0.5 mg SQ weekly.  He was also changed to ER form of Metformin to help with GI upset.  He denies any hypoglycemia.  - Lorik L Lacomb has currently uncontrolled symptomatic type 2 DM since 66 years of age.   -Recent labs reviewed.  - I had a long discussion with him about the progressive nature of diabetes and the pathology behind its complications. -his diabetes is complicated by CAD with MI and mild CKD and he remains at a high risk for more acute and chronic complications which include CAD, CVA, CKD, retinopathy, and neuropathy. These are all discussed in detail with him.  The following Lifestyle Medicine recommendations according to [facility]) were discussed and offered to patient and he agrees to start the journey:  A. Whole Foods, Plant-based plate comprising of fruits and vegetables, plant-based proteins, whole-grain carbohydrates was discussed in detail with the patient.   A  list for source of those  nutrients were also provided to the patient.  Patient will use only water or unsweetened tea for hydration. B.  The need to stay away from risky substances including alcohol, smoking; obtaining 7 to 9 hours of restorative sleep, at least 150 minutes of moderate intensity exercise weekly, the importance of healthy social connections,  and stress reduction techniques were discussed. C.  A full color page of  Calorie density of various food groups per pound showing examples of each food groups was provided to the patient.  - Nutritional counseling repeated at each appointment due to patients tendency to fall back in to old habits.  - The patient admits there is a room for improvement in their diet and drink choices. -  Suggestion is made for the patient to avoid simple carbohydrates from their diet including Cakes, Sweet Desserts / Pastries, Ice Cream, Soda (diet and regular), Sweet Tea, Candies, Chips, Cookies, Sweet Pastries, Store Bought Juices, Alcohol in Excess of 1-2 drinks a day, Artificial Sweeteners, Coffee Creamer, and "Sugar-free" Products. This will help patient to have stable blood glucose profile and potentially avoid unintended weight gain.   - I encouraged the patient to switch to unprocessed or minimally processed complex starch and increased protein intake (animal or plant source), fruits, and vegetables.   - Patient is advised to stick to a routine mealtimes to eat 3 meals a day and avoid unnecessary snacks (to snack only to correct hypoglycemia).  - I have approached him with the following individualized plan to manage his diabetes and patient agrees:   - He is advised to continue Metformin 1000 mg ER daily with breakfast and Ozempic 0.5 mg SQ weekly (he just finished his 0.25 mg supply).  Once he nears the end of his prescription, he is encouraged to reach out and let me know how he is doing/feeling and see if an increase is suitable for him.  He did note  some stomach discomfort on the day of his Ozempic injection.  -he is encouraged to continue monitoring blood glucose twice daily, before breakfast and before bed, and to call the clinic if he has readings less than 70 or above 300 for 3 tests in a row.    - Adjustment parameters are given to him for hypo and hyperglycemia in writing.  - Specific targets for  A1c; LDL, HDL, and Triglycerides were discussed with the patient.  2) Blood Pressure /Hypertension:  his blood pressure is controlled to target.   he is advised to continue his current medications including Metoprolol 25 mg p.o. twice daily.  3) Lipids/Hyperlipidemia:    Review of his recent lipid panel from 06/20/21 showed controlled LDL at 47 and slightly elevated triglycerides of 156 .  he is advised to continue Crestor 40 mg daily at bedtime.  Side effects and precautions discussed with him.  4)  Weight/Diet:  his Body mass index is 39.35 kg/m.  -  clearly complicating his diabetes care.   he is a candidate for weight loss. I discussed with him the fact that loss of 5 - 10% of his  current body weight will have the most impact on his diabetes management.  Exercise, and detailed carbohydrates information provided  -  detailed on discharge instructions.  5) Chronic Care/Health Maintenance: -he is not on ACEI/ARB and is on Statin medications and is encouraged to initiate and continue to follow up with Ophthalmology, Dentist, Podiatrist at least yearly or according to recommendations, and advised to stay away from smoking. I have  recommended yearly flu vaccine and pneumonia vaccine at least every 5 years; moderate intensity exercise for up to 150 minutes weekly; and sleep for at least 7 hours a day.  - he is advised to maintain close follow up with Coral Spikes, DO for primary care needs, as well as his other providers for optimal and coordinated care.      I spent 42 minutes in the care of the patient today including review of labs  from [place], Lipids, Thyroid Function, Hematology (current and previous including abstractions from other facilities); face-to-face time discussing  his blood glucose readings/logs, discussing hypoglycemia and hyperglycemia episodes and symptoms, medications doses, his options of short and long term treatment based on the latest standards of care / guidelines;  discussion about incorporating lifestyle medicine;  and documenting the encounter. Risk reduction counseling performed per USPSTF guidelines to reduce obesity and cardiovascular risk factors.     Please refer to Patient Instructions for Blood Glucose Monitoring and Insulin/Medications Dosing Guide"  in media tab for additional information. Please  also refer to " Patient Self Inventory" in the Media  tab for reviewed elements of pertinent patient history.  Rodena Medin participated in the discussions, expressed understanding, and voiced agreement with the above plans.  All questions were answered to his satisfaction. he is encouraged to contact clinic should he have any questions or concerns prior to his return visit.     Follow up plan: - Return in about 3 months (around 01/15/2022) for Diabetes F/U with A1c in office, No previsit labs, Bring meter and logs.   Rayetta Pigg, [facility] [address] [place], [place] [address] Phone: [phone] Fax: [phone]  10/15/2021, 12:04 PM

## 2021-10-21 ENCOUNTER — Other Ambulatory Visit: Payer: Self-pay | Admitting: Family Medicine

## 2021-10-23 NOTE — Telephone Encounter (Signed)
Nurses Please confirm with the patient that he is tolerating his medication currently I believe he is utilizing 0.25 so therefore he should go on up to 0.5 as long as he is tolerating it may have 1 month with 1 additional refill follow-up with Dr. Lacinda Axon in several months

## 2021-11-13 ENCOUNTER — Telehealth: Payer: Self-pay

## 2021-11-13 MED ORDER — ISOSORBIDE MONONITRATE ER 60 MG PO TB24: 60.0000 mg | ORAL_TABLET | Freq: Every day | ORAL | 3 refills | Status: DC

## 2021-11-13 NOTE — Telephone Encounter (Signed)
Called patient who states that when he increased his imdur dose to '90mg'$ , he began having "awful headaches" and couldn't tolerate it. He states that he contacted Dr Burt Knack who advised that he decrease back down to '60mg'$  daily. Patient has been taking '60mg'$  daily and states he is doing well on this dose. Will update chart at this time.

## 2021-11-13 NOTE — Telephone Encounter (Signed)
Pt calling stating that Dr. Burt Knack increased his medication isosorbide 60 mg tablet to 90 mg daily, but pt stated that he told Dr. Burt Knack that he could not take the 90 mg and Dr. Burt Knack stated that he could go back to just taking the 60 mg daily. This was not changed on pt's medication list. Would Dr. Burt Knack like to change the directions for this medication? Please address

## 2021-12-05 ENCOUNTER — Other Ambulatory Visit: Payer: Self-pay | Admitting: Family Medicine

## 2021-12-20 ENCOUNTER — Encounter: Payer: Self-pay | Admitting: Family Medicine

## 2021-12-20 ENCOUNTER — Ambulatory Visit (INDEPENDENT_AMBULATORY_CARE_PROVIDER_SITE_OTHER): Payer: Medicare Other | Admitting: Family Medicine

## 2021-12-20 VITALS — BP 127/76 | HR 67 | Temp 98.1°F | Ht 67.0 in | Wt 248.6 lb

## 2021-12-20 DIAGNOSIS — I25119 Atherosclerotic heart disease of native coronary artery with unspecified angina pectoris: Secondary | ICD-10-CM

## 2021-12-20 DIAGNOSIS — I10 Essential (primary) hypertension: Secondary | ICD-10-CM | POA: Diagnosis not present

## 2021-12-20 DIAGNOSIS — E1165 Type 2 diabetes mellitus with hyperglycemia: Secondary | ICD-10-CM | POA: Diagnosis not present

## 2021-12-20 MED ORDER — OZEMPIC (0.25 OR 0.5 MG/DOSE) 2 MG/3ML ~~LOC~~ SOPN
0.5000 mg | PEN_INJECTOR | SUBCUTANEOUS | 3 refills | Status: DC
Start: 2021-12-20 — End: 2022-01-15

## 2021-12-20 NOTE — Progress Notes (Signed)
Subjective:  Patient ID: [name], male    DOB: Jun 22, 1955  Age: 66 y.o. MRN: [id number]  CC: Chief Complaint  Patient presents with   Follow-up    3 month follow up no concerns    HPI:  66 year old male with CAD, hypertension, hepatic steatosis, type 2 diabetes, gout, hyperlipidemia, obesity presents for follow-up.  Blood pressures well controlled.  Doing well on Imdur and metoprolol.  Patient states that his blood sugars seem to be improving although he does have high readings and low readings at times.  He states that he seems to stay around 110.  Currently on metformin and Ozempic (0.5).  He states that he has some diarrhea and some constipation.  No nausea.  No other reported side effects from his diabetes medications.  Denies chest pain or shortness of breath.  Doing well at this time.  Patient Active Problem List   Diagnosis Date Noted   Mixed hyperlipidemia 06/20/2021   History of gout 06/20/2021   Type 2 diabetes mellitus with hyperglycemia ([place]) 06/20/2021   Coronary artery disease involving native coronary artery with angina pectoris ([place])    Obesity    GERD (gastroesophageal reflux disease) 07/12/2012   Fatty liver 07/07/2011   Essential hypertension 08/22/2008    Social Hx   Social History   Socioeconomic History   Marital status: Divorced    Spouse name: Not on file   Number of children: Not on file   Years of education: Not on file   Highest education level: Not on file  Occupational History   Not on file  Tobacco Use   Smoking status: Never   Smokeless tobacco: Never  Vaping Use   Vaping Use: Never used  Substance and Sexual Activity   Alcohol use: No   Drug use: No   Sexual activity: Yes    Birth control/protection: None  Other Topics Concern   Not on file  Social History Narrative   Not on file   Social Determinants of Health   Financial Resource Strain: Not on file  Food Insecurity: Not on file  Transportation Needs: Not on  file  Physical Activity: Not on file  Stress: Not on file  Social Connections: Not on file    Review of Systems Per HPI  Objective:  BP 127/76 (BP Location: Right Arm, Patient Position: Sitting, Cuff Size: Large)   Pulse 67   Temp 98.1 F (36.7 C) (Temporal)   Ht '5\' 7"'$  (1.702 m)   Wt 248 lb 9.6 oz (112.8 kg)   SpO2 97%   BMI 38.94 kg/m      12/20/2021    8:34 AM 10/15/2021   10:04 AM 09/20/2021    8:39 AM  BP/Weight  Systolic BP 295 284 132  Diastolic BP 76 78 69  Wt. (Lbs) 248.6 255 266.8  BMI 38.94 kg/m2 39.35 kg/m2 39.4 kg/m2    Physical Exam Vitals and nursing note reviewed.  Constitutional:      General: He is not in acute distress.    Appearance: Normal appearance. He is obese.  HENT:     Head: Normocephalic and atraumatic.  Cardiovascular:     Rate and Rhythm: Normal rate and regular rhythm.  Pulmonary:     Effort: Pulmonary effort is normal.     Breath sounds: Normal breath sounds. No wheezing, rhonchi or rales.  Neurological:     Mental Status: He is alert.  Psychiatric:        Mood and Affect: Mood  normal.        Behavior: Behavior normal.     Lab Results  Component Value Date   WBC 7.4 06/20/2021   HGB 14.7 06/20/2021   HCT 44.2 06/20/2021   PLT 220 06/20/2021   GLUCOSE 140 (H) 09/13/2021   CHOL 115 06/20/2021   TRIG 156 (H) 06/20/2021   HDL 42 06/20/2021   LDLCALC 47 06/20/2021   ALT 24 06/20/2021   AST 27 06/20/2021   NA 138 09/13/2021   K 4.2 09/13/2021   CL 105 09/13/2021   CREATININE 0.87 09/13/2021   BUN 13 09/13/2021   CO2 27 09/13/2021   TSH 2.270 06/27/2020   INR 1.05 06/06/2013   HGBA1C 7.9 (H) 09/20/2021     Assessment & Plan:   Problem List Items Addressed This Visit       Cardiovascular and Mediastinum   Coronary artery disease involving native coronary artery with angina pectoris ([place])    Stable currently and asymptomatic.  Continue current medications.      Essential hypertension - Primary    BP well  controlled.  He brought in his home readings reflecting good control as well.  Continue metoprolol and Imdur.        Endocrine   Type 2 diabetes mellitus with hyperglycemia (HCC)    Seems to be improving.  A1c today.  Continue metformin and Ozempic.  May need increased dose of Ozempic if A1c is not at goal.      Relevant Medications   Semaglutide,0.25 or 0.'5MG'$ /DOS, (OZEMPIC, 0.25 OR 0.5 MG/DOSE,) 2 MG/3ML SOPN   Other Relevant Orders   Hemoglobin A1c    Meds ordered this encounter  Medications   Semaglutide,0.25 or 0.'5MG'$ /DOS, (OZEMPIC, 0.25 OR 0.5 MG/DOSE,) 2 MG/3ML SOPN    Sig: Inject 0.5 mg as directed once a week.    Dispense:  3 mL    Refill:  3    Follow-up:  3 months  Floyd DO [place]

## 2021-12-20 NOTE — Assessment & Plan Note (Signed)
BP well controlled.  He brought in his home readings reflecting good control as well.  Continue metoprolol and Imdur.

## 2021-12-20 NOTE — Assessment & Plan Note (Signed)
Stable currently and asymptomatic.  Continue current medications.

## 2021-12-20 NOTE — Assessment & Plan Note (Signed)
Seems to be improving.  A1c today.  Continue metformin and Ozempic.  May need increased dose of Ozempic if A1c is not at goal.

## 2021-12-20 NOTE — Patient Instructions (Signed)
A1c today  Continue your medications.  Follow up in 3 months.   Take care  Dr. Lacinda Axon

## 2021-12-21 LAB — HEMOGLOBIN A1C
Est. average glucose Bld gHb Est-mCnc: 154 mg/dL
Hgb A1c MFr Bld: 7 % — ABNORMAL HIGH (ref 4.8–5.6)

## 2021-12-23 ENCOUNTER — Other Ambulatory Visit: Payer: Self-pay | Admitting: Family Medicine

## 2021-12-23 MED ORDER — LISINOPRIL 5 MG PO TABS: 5.0000 mg | ORAL_TABLET | Freq: Every day | ORAL | 3 refills | Status: DC

## 2021-12-24 NOTE — Addendum Note (Signed)
Addended by: Dairl Ponder on: 12/24/2021 08:06 AM   Modules accepted: Orders

## 2022-01-01 ENCOUNTER — Telehealth: Payer: Self-pay | Admitting: *Deleted

## 2022-01-01 NOTE — Telephone Encounter (Signed)
Pt called and lisinopril was discussed - he wanted more info about why he was now taking this. Discussed at length - he states he has a slight dry cough now noticeable since started the med.  He was made aware that this can be a potential s/e, but that he should give it some time to see if it gets better and also make sure he is getting enough water intake.  He is aware to call us back if needed / no better

## 2022-01-08 ENCOUNTER — Ambulatory Visit (INDEPENDENT_AMBULATORY_CARE_PROVIDER_SITE_OTHER): Payer: Medicare Other

## 2022-01-08 VITALS — Ht 67.0 in | Wt 248.0 lb

## 2022-01-08 DIAGNOSIS — Z0000 Encounter for general adult medical examination without abnormal findings: Secondary | ICD-10-CM

## 2022-01-08 NOTE — Progress Notes (Signed)
Virtual Visit via Telephone Note  I connected with  [name] on 01/08/22 at  1:00 PM EDT by telephone and verified that I am speaking with the correct person using two identifiers.  Location: Patient: home Provider: [facility] Persons participating in the virtual visit: patient/Nurse Health Advisor   I discussed the limitations, risks, security and privacy concerns of performing an evaluation and management service by telephone and the availability of in person appointments. The patient expressed understanding and agreed to proceed.  Interactive audio and video telecommunications were attempted between this nurse and patient, however failed, due to patient having technical difficulties OR patient did not have access to video capability.  We continued and completed visit with audio only.  Some vital signs may be absent or patient reported.   Dionisio David, LPN  Subjective:   [name] is a 66 y.o. male who presents for Medicare Annual/Subsequent preventive examination.  Review of Systems     Cardiac Risk Factors include: advanced age (>89mn, >>32women);diabetes mellitus;dyslipidemia     Objective:    There were no vitals filed for this visit. There is no height or weight on file to calculate BMI.     01/08/2022    1:13 PM 09/17/2021    6:37 AM 11/10/2017   11:56 AM 05/14/2016    8:43 AM 05/09/2016    9:27 AM 11/14/2015    4:17 PM 07/02/2014    4:10 PM  Advanced Directives  Does Patient Have a Medical Advance Directive? No No No No No No No  Does patient want to make changes to medical advance directive?    No - Patient declined     Would patient like information on creating a medical advance directive? No - Patient declined No - Patient declined No - Patient declined No - Patient declined No - Patient declined No - patient declined information     Current Medications (verified) Outpatient Encounter Medications as of 01/08/2022  Medication Sig   Alirocumab  (PRALUENT) 75 MG/ML SOAJ INJECT 1 ML UNDER THE SKIN EVERY 14 DAYS   aspirin EC 81 MG tablet Take 81 mg by mouth daily.   clopidogrel (PLAVIX) 75 MG tablet TAKE 1 TABLET BY MOUTH EVERY DAY   glucose blood (ONETOUCH VERIO) test strip Use to monitor glucose twice daily.   isosorbide mononitrate (IMDUR) 60 MG 24 hr tablet Take 1 tablet (60 mg total) by mouth daily.   Lancets (ONETOUCH DELICA PLUS [id number] MISC Use to monitor glucose twice daily.   lisinopril (ZESTRIL) 5 MG tablet Take 1 tablet (5 mg total) by mouth daily.   metFORMIN (GLUCOPHAGE-XR) 500 MG 24 hr tablet Take 2 tablets (1,000 mg total) by mouth daily with breakfast.   metoprolol tartrate (LOPRESSOR) 25 MG tablet TAKE 1 TABLET BY MOUTH TWICE A DAY   nitroGLYCERIN (NITROSTAT) 0.4 MG SL tablet PLACE 1 TABLET UNDER THE TONGUE EVERY 5 MINUTES AS NEEDED FOR CHEST PAIN.   rosuvastatin (CRESTOR) 40 MG tablet TAKE 1 TABLET BY MOUTH EVERY DAY   Semaglutide,0.25 or 0.'5MG'$ /DOS, (OZEMPIC, 0.25 OR 0.5 MG/DOSE,) 2 MG/3ML SOPN Inject 0.5 mg as directed once a week.   colchicine 0.6 MG tablet Take 0.6 mg by mouth daily as needed (gout flares). (Patient not taking: Reported on 12/20/2021)   No facility-administered encounter medications on file as of 01/08/2022.    Allergies (verified) Hydrocodone   History: Past Medical History:  Diagnosis Date   Arthritis    CAD (coronary artery disease) 2000  a. History of MI ~2000 s/p PCI. b. CABG in 2006 (LIMA-LAD, RIMA-acute marginal, SVG-DX, SVG-RCA). c. NSTEMI 05/2013:severe stenoses of the SVG-PDA and SVG-diagonal, both treated successfully with PCI (DES x2).   Diabetes ([place])    GERD (gastroesophageal reflux disease)    Gout    History of hypertension    Hydrocele 2012   Hyperlipidemia    Hypertension    Myocardial infarction [facility]) 2014   x3   Obesity    S/P coronary angioplasty 11/10/17 11/11/2017   Past Surgical History:  Procedure Laterality Date   BIOPSY  09/17/2021   Procedure: BIOPSY;   Surgeon: Harvel Quale, MD;  Location[facility];  Service: Gastroenterology;;   CATARACT EXTRACTION W/PHACO Left 05/17/2012   Procedure: CATARACT EXTRACTION PHACO AND INTRAOCULAR LENS PLACEMENT ([place]);  Surgeon: Tonny Branch, MD;  Location: [facility] ORS;  Service: Ophthalmology;  Laterality: Left;  CDE:  0.76   CATARACT EXTRACTION W/PHACO Right 08/26/2012   Procedure: CATARACT EXTRACTION PHACO AND INTRAOCULAR LENS PLACEMENT ([facility]);  Surgeon: Tonny Branch, MD;  Location: [facility] ORS;  Service: Ophthalmology;  Laterality: Right;  CDE: 1.13   COLONOSCOPY WITH PROPOFOL N/A 09/17/2021   Procedure: COLONOSCOPY WITH PROPOFOL;  Surgeon: Harvel Quale, MD;  Location[facility];  Service: Gastroenterology;  Laterality: N/A;  [place]   stents x2   CORONARY ARTERY BYPASS GRAFT  2006   4 vessels   Coronary artery bypass grafting  May 2006   Four-vessel   CORONARY BALLOON ANGIOPLASTY N/A 11/10/2017   Procedure: CORONARY BALLOON ANGIOPLASTY;  Surgeon: [name], MD;  Location: [place] CV LAB;  Service: Cardiovascular;  Laterality: N/A;   FOOT FRACTURE SURGERY     right   HEMOSTASIS CLIP PLACEMENT  09/17/2021   Procedure: HEMOSTASIS CLIP PLACEMENT;  Surgeon: Harvel Quale, MD;  [facility];  Service: Gastroenterology;;   LEFT HEART CATH AND CORS/GRAFTS ANGIOGRAPHY N/A 11/10/2017   Procedure: LEFT HEART CATH AND CORS/GRAFTS ANGIOGRAPHY;  Surgeon: [name], MD;  Location: [place] CV LAB;  Service: Cardiovascular;  Laterality: N/A;   LEFT HEART CATHETERIZATION WITH CORONARY/GRAFT ANGIOGRAM N/A 05/05/2011   Procedure: LEFT HEART CATHETERIZATION WITH Beatrix Fetters;  Surgeon: Josue Hector, MD;  Location: [facility] CATH LAB;  Service: Cardiovascular;  Laterality: N/A;   LEFT HEART CATHETERIZATION WITH CORONARY/GRAFT ANGIOGRAM N/A 06/06/2013   Procedure: LEFT HEART CATHETERIZATION WITH Beatrix Fetters;  Surgeon: Blane Ohara, MD;  Location: [facility] CATH LAB;  Service: Cardiovascular;  Laterality: N/A;   MASS EXCISION Right 05/14/2016   Procedure: EXCISION MASS RIGHT ELBOW;  Surgeon: Carole Civil, MD;  Location: [facility] ORS;  Service: Orthopedics;  Laterality: Right;   PILONIDAL CYST EXCISION     POLYPECTOMY  09/17/2021   Procedure: POLYPECTOMY;  Surgeon: Harvel Quale, MD;  [facility];  Service: Gastroenterology;;   TONSILLECTOMY     Family History  Problem Relation Age of Onset   Cancer Mother    Cervical cancer Mother    Cancer Father    Throat cancer Father    Thyroid disease Son    Healthy Daughter    Healthy Son    Social History   Socioeconomic History   Marital status: Divorced    Spouse name: Not on file   Number of children: Not on file   Years of education: Not on file   Highest education level: Not on file  Occupational History   Not on file  Tobacco Use   Smoking status: Never   Smokeless tobacco: Never  Vaping Use   Vaping Use: Never used  Substance and Sexual Activity   Alcohol use: No   Drug use: No   Sexual activity: Yes    Birth control/protection: None  Other Topics Concern   Not on file  Social History Narrative   Not on file   Social Determinants of Health   Financial Resource Strain: Low Risk  (01/08/2022)   Overall Financial Resource Strain (CARDIA)    Difficulty of Paying Living Expenses: Not hard at all  Food Insecurity: No Food Insecurity (01/08/2022)   Hunger Vital Sign    Worried About Running Out of Food in the Last Year: Never true    Ran Out of Food in the Last Year: Never true  Transportation Needs: No Transportation Needs (01/08/2022)   PRAPARE - Hydrologist (Medical): No    Lack of Transportation (Non-Medical): No  Physical Activity: Insufficiently Active (01/08/2022)   Exercise Vital Sign    Days of Exercise per Week: 2 days    Minutes of Exercise per Session: 20 min  Stress: No Stress  Concern Present (01/08/2022)   [place]    Feeling of Stress : Not at all  Social Connections: Moderately Isolated (01/08/2022)   Social Connection and Isolation Panel [NHANES]    Frequency of Communication with Friends and Family: More than three times a week    Frequency of Social Gatherings with Friends and Family: Once a week    Attends Religious Services: More than 4 times per year    Active Member of [employer] or Organizations: No    Attends Music therapist: Never    Marital Status: Divorced    Tobacco Counseling Counseling given: Not Answered   Clinical Intake:  Pre-visit preparation completed: Yes  Pain : No/denies pain     Nutritional Risks: None Diabetes: Yes CBG done?: No Did pt. bring in CBG monitor from home?: No  How often do you need to have someone help you when you read instructions, pamphlets, or other written materials from your doctor or pharmacy?: 1 - Never  Diabetic?yes Nutrition Risk Assessment:  Has the patient had any N/V/D within the last 2 months?  Yes  Does the patient have any non-healing wounds?  No  Has the patient had any unintentional weight loss or weight gain?  No   Diabetes:  Is the patient diabetic?  Yes  If diabetic, was a CBG obtained today?  No  Did the patient bring in their glucometer from home?  No  How often do you monitor your CBG's? Once a day.   Financial Strains and Diabetes Management:  Are you having any financial strains with the device, your supplies or your medication? No .  Does the patient want to be seen by Chronic Care Management for management of their diabetes?  No  Would the patient like to be referred to a Nutritionist or for Diabetic Management?  No   Diabetic Exams:  Diabetic Eye Exam: Completed Jan 2023 per pt.  Pt has been advised about the importance in completing this exam.   Diabetic Foot Exam: Completed 10/15/21. Pt  has been advised about the importance in completing this exam.   Interpreter Needed?: No  Information entered by :: Kirke Shaggy, LPN   Activities of Daily Living    01/08/2022    1:15 PM 09/13/2021  9:17 AM  In your present state of health, do you have any difficulty performing the following activities:  Hearing? 0 0  Vision? 0 0  Difficulty concentrating or making decisions? 0 0  Walking or climbing stairs? 0 0  Dressing or bathing? 0 0  Doing errands, shopping? 0   Preparing Food and eating ? N   Using the Toilet? N   In the past six months, have you accidently leaked urine? N   Do you have problems with loss of bowel control? N   Managing your Medications? N   Managing your Finances? N   Housekeeping or managing your Housekeeping? N     Patient Care Team: Coral Spikes, DO as PCP - General (Family Medicine) [name], MD as PCP - Cardiology (Cardiology)  Indicate any recent Medical Services you may have received from other than [facility] providers in the past year (date may be approximate).     Assessment:   This is a routine wellness examination for [name].  Hearing/Vision screen Hearing Screening - Comments:: No aids Vision Screening - Comments:: Readers- [facility] [place]  Dietary issues and exercise activities discussed: Current Exercise Habits: Home exercise routine, Type of exercise: walking, Time (Minutes): 20, Frequency (Times/Week): 2, Weekly Exercise (Minutes/Week): 40, Intensity: Mild   Goals Addressed             This Visit's Progress    DIET - EAT MORE FRUITS AND VEGETABLES         Depression Screen    01/08/2022    1:09 PM 12/20/2021    8:35 AM 06/20/2021    8:35 AM  PHQ 2/9 Scores  PHQ - 2 Score 0 0 0  PHQ- 9 Score 0      Fall Risk    01/08/2022    1:15 PM 12/20/2021    8:35 AM 06/20/2021    8:35 AM  Fall Risk   Falls in the past year? 1 0 1  Number falls in past yr: 0 0 0  Injury with Fall? 0 0 1  Risk for fall  due to : History of fall(s) No Fall Risks Impaired balance/gait  Follow up Falls evaluation completed;Falls prevention discussed Falls evaluation completed Falls evaluation completed;Education provided    FALL RISK PREVENTION PERTAINING TO THE HOME:  Any stairs in or around the home? Yes  If so, are there any without handrails? No  Home free of loose throw rugs in walkways, pet beds, electrical cords, etc? Yes  Adequate lighting in your home to reduce risk of falls? Yes   ASSISTIVE DEVICES UTILIZED TO PREVENT FALLS:  Life alert? No  Use of a cane, walker or w/c? No  Grab bars in the bathroom? No  Shower chair or bench in shower? Yes  Elevated toilet seat or a handicapped toilet? No   Cognitive Function:        01/08/2022    1:15 PM  6CIT Screen  What Year? 0 points  What month? 0 points  What time? 0 points  Count back from 20 0 points  Months in reverse 0 points  Repeat phrase 0 points  Total Score 0 points    Immunizations Immunization History  Administered Date(s) Administered   Fluad Quad(high Dose 65+) 03/23/2020   Influenza, High Dose Seasonal PF 03/12/2021   Influenza, Quadrivalent, Recombinant, Inj, Pf 01/25/2019   Influenza,trivalent, recombinat, inj, PF 01/09/2017   Influenza-Unspecified 01/22/2018   Moderna Sars-Covid-2 Vaccination 05/20/2019, 06/21/2019, 03/23/2020   PNEUMOCOCCAL  CONJUGATE-20 06/20/2021   Tdap 03/24/2014   Zoster Recombinat (Shingrix) 10/16/2018, 01/25/2019    TDAP status: Up to date  Flu Vaccine status: Due, Education has been provided regarding the importance of this vaccine. Advised may receive this vaccine at local pharmacy or Health Dept. Aware to provide a copy of the vaccination record if obtained from local pharmacy or Health Dept. Verbalized acceptance and understanding.  Pneumococcal vaccine status: Up to date  Covid-19 vaccine status: Completed vaccines  Qualifies for Shingles Vaccine? Yes   Zostavax completed No    Shingrix Completed?: No.    Education has been provided regarding the importance of this vaccine. Patient has been advised to call insurance company to determine out of pocket expense if they have not yet received this vaccine. Advised may also receive vaccine at local pharmacy or Health Dept. Verbalized acceptance and understanding.  Screening Tests Health Maintenance  Topic Date Due   OPHTHALMOLOGY EXAM  Never done   Hepatitis C Screening  Never done   COVID-19 Vaccine (4 - Moderna risk series) 05/18/2020   INFLUENZA VACCINE  10/22/2021   HEMOGLOBIN A1C  06/20/2022   Diabetic kidney evaluation - Urine ACR  06/21/2022   Diabetic kidney evaluation - GFR measurement  09/14/2022   FOOT EXAM  10/16/2022   TETANUS/TDAP  03/24/2024   COLONOSCOPY (Pts 45-61yr Insurance coverage will need to be confirmed)  09/17/2024   Pneumonia Vaccine 67 Years old  Completed   Zoster Vaccines- Shingrix  Completed   HPV VACCINES  Aged Out    Health Maintenance  Health Maintenance Due  Topic Date Due   OPHTHALMOLOGY EXAM  Never done   Hepatitis C Screening  Never done   COVID-19 Vaccine (4 - Moderna risk series) 05/18/2020   INFLUENZA VACCINE  10/22/2021    Colorectal cancer screening: Type of screening: Colonoscopy. Completed 09/17/21. Repeat every 3 years  Lung Cancer Screening: (Low Dose CT Chest recommended if Age 67-80years, 30 pack-year currently smoking OR have quit w/in 15years.) does not qualify.   Additional Screening:  Hepatitis C Screening: does qualify; Completed no  Vision Screening: Recommended annual ophthalmology exams for early detection of glaucoma and other disorders of the eye. Is the patient up to date with their annual eye exam?  Yes  Who is the provider or what is the name of the office in which the patient attends annual eye exams[place] [facility] [place] not established with a provider, would they like to be referred to a provider to establish care? No .    Dental Screening: Recommended annual dental exams for proper oral hygiene  Community Resource Referral / Chronic Care Management: CRR required this visit?  No   CCM required this visit?  No      Plan:     I have personally reviewed and noted the following in the patient's chart:   Medical and social history Use of alcohol, tobacco or illicit drugs  Current medications and supplements including opioid prescriptions. Patient is not currently taking opioid prescriptions. Functional ability and status Nutritional status Physical activity Advanced directives List of other physicians Hospitalizations, surgeries, and ER visits in previous 12 months Vitals Screenings to include cognitive, depression, and falls Referrals and appointments  In addition, I have reviewed and discussed with patient certain preventive protocols, quality metrics, and best practice recommendations. A written personalized care plan for preventive services as well as general preventive health recommendations were provided to patient.     [name]  Fayrene Helper, LPN   [id number]   Nurse Notes: none

## 2022-01-08 NOTE — Patient Instructions (Signed)
[name] , Thank you for taking time to come for your Medicare Wellness Visit. I appreciate your ongoing commitment to your health goals. Please review the following plan we discussed and let me know if I can assist you in the future.   Screening recommendations/referrals: Colonoscopy: 09/17/21 Recommended yearly ophthalmology/optometry visit for glaucoma screening and checkup Recommended yearly dental visit for hygiene and checkup  Vaccinations: Influenza vaccine: n/d Pneumococcal vaccine: 06/20/21 Tdap vaccine: 08/17/14 Shingles vaccine: n/d   Covid-19: 05/20/19, 06/21/19  Advanced directives: no  Conditions/risks identified: none  Next appointment: Follow up in one year for your annual wellness visit. 01/20/23 @ 1 pm by phone  Preventive Care 65 Years and Older, Male Preventive care refers to lifestyle choices and visits with your health care provider that can promote health and wellness. What does preventive care include? A yearly physical exam. This is also called an annual well check. Dental exams once or twice a year. Routine eye exams. Ask your health care provider how often you should have your eyes checked. Personal lifestyle choices, including: Daily care of your teeth and gums. Regular physical activity. Eating a healthy diet. Avoiding tobacco and drug use. Limiting alcohol use. Practicing safe sex. Taking low doses of aspirin every day. Taking vitamin and mineral supplements as recommended by your health care provider. What happens during an annual well check? The services and screenings done by your health care provider during your annual well check will depend on your age, overall health, lifestyle risk factors, and family history of disease. Counseling  Your health care provider may ask you questions about your: Alcohol use. Tobacco use. Drug use. Emotional well-being. Home and relationship well-being. Sexual activity. Eating habits. History of  falls. Memory and ability to understand (cognition). Work and work Statistician. Screening  You may have the following tests or measurements: Height, weight, and BMI. Blood pressure. Lipid and cholesterol levels. These may be checked every 5 years, or more frequently if you are over 13 years old. Skin check. Lung cancer screening. You may have this screening every year starting at age 67 if you have a 30-pack-year history of smoking and currently smoke or have quit within the past 15 years. Fecal occult blood test (FOBT) of the stool. You may have this test every year starting at age 68. Flexible sigmoidoscopy or colonoscopy. You may have a sigmoidoscopy every 5 years or a colonoscopy every 10 years starting at age 55. Prostate cancer screening. Recommendations will vary depending on your family history and other risks. Hepatitis C blood test. Hepatitis B blood test. Sexually transmitted disease (STD) testing. Diabetes screening. This is done by checking your blood sugar (glucose) after you have not eaten for a while (fasting). You may have this done every 1-3 years. Abdominal aortic aneurysm (AAA) screening. You may need this if you are a current or former smoker. Osteoporosis. You may be screened starting at age 57 if you are at high risk. Talk with your health care provider about your test results, treatment options, and if necessary, the need for more tests. Vaccines  Your health care provider may recommend certain vaccines, such as: Influenza vaccine. This is recommended every year. Tetanus, diphtheria, and acellular pertussis (Tdap, Td) vaccine. You may need a Td booster every 10 years. Zoster vaccine. You may need this after age 74. Pneumococcal 13-valent conjugate (PCV13) vaccine. One dose is recommended after age 94. Pneumococcal polysaccharide (PPSV23) vaccine. One dose is recommended after age 16. Talk to your health care provider  about which screenings and vaccines you need and  how often you need them. This information is not intended to replace advice given to you by your health care provider. Make sure you discuss any questions you have with your health care provider. Document Released: 04/06/2015 Document Revised: 11/28/2015 Document Reviewed: 01/09/2015 Elsevier Interactive Patient Education  2017 [place] Prevention in the Home Falls can cause injuries. They can happen to people of all ages. There are many things you can do to make your home safe and to help prevent falls. What can I do on the outside of my home? Regularly fix the edges of walkways and driveways and fix any cracks. Remove anything that might make you trip as you walk through a door, such as a raised step or threshold. Trim any bushes or trees on the path to your home. Use bright outdoor lighting. Clear any walking paths of anything that might make someone trip, such as rocks or tools. Regularly check to see if handrails are loose or broken. Make sure that both sides of any steps have handrails. Any raised decks and porches should have guardrails on the edges. Have any leaves, snow, or ice cleared regularly. Use sand or salt on walking paths during winter. Clean up any spills in your garage right away. This includes oil or grease spills. What can I do in the bathroom? Use night lights. Install grab bars by the toilet and in the tub and shower. Do not use towel bars as grab bars. Use non-skid mats or decals in the tub or shower. If you need to sit down in the shower, use a plastic, non-slip stool. Keep the floor dry. Clean up any water that spills on the floor as soon as it happens. Remove soap buildup in the tub or shower regularly. Attach bath mats securely with double-sided non-slip rug tape. Do not have throw rugs and other things on the floor that can make you trip. What can I do in the bedroom? Use night lights. Make sure that you have a light by your bed that is easy to  reach. Do not use any sheets or blankets that are too big for your bed. They should not hang down onto the floor. Have a firm chair that has side arms. You can use this for support while you get dressed. Do not have throw rugs and other things on the floor that can make you trip. What can I do in the kitchen? Clean up any spills right away. Avoid walking on wet floors. Keep items that you use a lot in easy-to-reach places. If you need to reach something above you, use a strong step stool that has a grab bar. Keep electrical cords out of the way. Do not use floor polish or wax that makes floors slippery. If you must use wax, use non-skid floor wax. Do not have throw rugs and other things on the floor that can make you trip. What can I do with my stairs? Do not leave any items on the stairs. Make sure that there are handrails on both sides of the stairs and use them. Fix handrails that are broken or loose. Make sure that handrails are as long as the stairways. Check any carpeting to make sure that it is firmly attached to the stairs. Fix any carpet that is loose or worn. Avoid having throw rugs at the top or bottom of the stairs. If you do have throw rugs, attach them to the floor  with carpet tape. Make sure that you have a light switch at the top of the stairs and the bottom of the stairs. If you do not have them, ask someone to add them for you. What else can I do to help prevent falls? Wear shoes that: Do not have high heels. Have rubber bottoms. Are comfortable and fit you well. Are closed at the toe. Do not wear sandals. If you use a stepladder: Make sure that it is fully opened. Do not climb a closed stepladder. Make sure that both sides of the stepladder are locked into place. Ask someone to hold it for you, if possible. Clearly mark and make sure that you can see: Any grab bars or handrails. First and last steps. Where the edge of each step is. Use tools that help you move  around (mobility aids) if they are needed. These include: Canes. Walkers. Scooters. Crutches. Turn on the lights when you go into a dark area. Replace any light bulbs as soon as they burn out. Set up your furniture so you have a clear path. Avoid moving your furniture around. If any of your floors are uneven, fix them. If there are any pets around you, be aware of where they are. Review your medicines with your doctor. Some medicines can make you feel dizzy. This can increase your chance of falling. Ask your doctor what other things that you can do to help prevent falls. This information is not intended to replace advice given to you by your health care provider. Make sure you discuss any questions you have with your health care provider. Document Released: 01/04/2009 Document Revised: 08/16/2015 Document Reviewed: 04/14/2014 Elsevier Interactive Patient Education  2017 [employer].

## 2022-01-15 ENCOUNTER — Encounter: Payer: Self-pay | Admitting: Nurse Practitioner

## 2022-01-15 ENCOUNTER — Ambulatory Visit (INDEPENDENT_AMBULATORY_CARE_PROVIDER_SITE_OTHER): Payer: Medicare Other | Admitting: Nurse Practitioner

## 2022-01-15 VITALS — BP 123/79 | HR 65 | Ht 67.0 in | Wt 254.0 lb

## 2022-01-15 DIAGNOSIS — E1165 Type 2 diabetes mellitus with hyperglycemia: Secondary | ICD-10-CM

## 2022-01-15 DIAGNOSIS — E782 Mixed hyperlipidemia: Secondary | ICD-10-CM

## 2022-01-15 DIAGNOSIS — I10 Essential (primary) hypertension: Secondary | ICD-10-CM

## 2022-01-15 MED ORDER — SEMAGLUTIDE (1 MG/DOSE) 4 MG/3ML ~~LOC~~ SOPN: 1.0000 mg | PEN_INJECTOR | SUBCUTANEOUS | 1 refills | Status: DC

## 2022-01-15 NOTE — Progress Notes (Signed)
Endocrinology Follow Up Note       01/15/2022, 10:21 AM   Subjective:    Patient ID: [name], male    DOB: 1955-12-21.  [name] is being seen in follow up after being seen in consultation for management of currently uncontrolled symptomatic diabetes requested by  Coral Spikes, DO.   Past Medical History:  Diagnosis Date   Arthritis    CAD (coronary artery disease) 2000   a. History of MI ~2000 s/p PCI. b. CABG in 2006 (LIMA-LAD, RIMA-acute marginal, SVG-DX, SVG-RCA). c. NSTEMI 05/2013:severe stenoses of the SVG-PDA and SVG-diagonal, both treated successfully with PCI (DES x2).   Diabetes ([place])    GERD (gastroesophageal reflux disease)    Gout    History of hypertension    Hydrocele 2012   Hyperlipidemia    Hypertension    Myocardial infarction [name]) 2014   x3   Obesity    S/P coronary angioplasty 11/10/17 11/11/2017    Past Surgical History:  Procedure Laterality Date   BIOPSY  09/17/2021   Procedure: BIOPSY;  Surgeon: Harvel Quale, MD;  Location: [facility];  Service: Gastroenterology;;   CATARACT EXTRACTION W/PHACO Left 05/17/2012   Procedure: CATARACT EXTRACTION PHACO AND INTRAOCULAR LENS PLACEMENT ([place]);  Surgeon: Tonny Branch, MD;  Location: [facility] OR[facility];  Service: Ophthalmology;  Laterality: Left;  CDE:  0.76   CATARACT EXTRACTION W/PHACO Right 08/26/2012   Procedure: CATARACT EXTRACTION PHACO AND INTRAOCULAR LENS PLACEMENT (IOC);  Surgeon: Tonny Branch, MD;  Location: [facility];  Service: Ophthalmology;  Laterality: Right;  CDE: 1.13   COLONOSCOPY WITH PROPOFOL N/A 09/17/2021   Procedure: COLONOSCOPY WITH PROPOFOL;  Surgeon: Harvel Quale, MD;  Location: [facility];  Service: Gastroenterology;  Laterality: N/A;  [place]   stents x2   CORONARY ARTERY BYPASS GRAFT  2006   4 vessels   Coronary artery bypass grafting  May 2006   Four-vessel    CORONARY BALLOON ANGIOPLASTY N/A 11/10/2017   Procedure: CORONARY BALLOON ANGIOPLASTY;  Surgeon: [name], MD;  Location: [place] CV LAB;  Service: Cardiovascular;  Laterality: N/A;   FOOT FRACTURE SURGERY     right   HEMOSTASIS CLIP PLACEMENT  09/17/2021   Procedure: HEMOSTASIS CLIP PLACEMENT;  Surgeon: Harvel Quale, MD;  [facility]: [facility];  Service: Gastroenterology;;   LEFT HEART CATH AND CORS/GRAFTS ANGIOGRAPHY N/A 11/10/2017   Procedure: LEFT HEART CATH AND CORS/GRAFTS ANGIOGRAPHY;  Surgeon: [name], MD;  Location: [place] [facility] LAB;  Service: Cardiovascular;  Laterality: N/A;   LEFT HEART CATHETERIZATION WITH CORONARY/GRAFT ANGIOGRAM N/A 05/05/2011   Procedure: LEFT HEART CATHETERIZATION WITH Beatrix Fetters;  Surgeon: Josue Hector, MD;  Location: [facility] CATH LAB;  Service: Cardiovascular;  Laterality: N/A;   LEFT HEART CATHETERIZATION WITH CORONARY/GRAFT ANGIOGRAM N/A 06/06/2013   Procedure: LEFT HEART CATHETERIZATION WITH Beatrix Fetters;  Surgeon: Blane Ohara, MD;  Location: [facility] CATH LAB;  Service: Cardiovascular;  Laterality: N/A;   MASS EXCISION Right 05/14/2016   Procedure: EXCISION MASS RIGHT ELBOW;  Surgeon: Carole Civil, MD;  Location: [facility];  Service: Orthopedics;  Laterality: Right;   PILONIDAL CYST  EXCISION     POLYPECTOMY  09/17/2021   Procedure: POLYPECTOMY;  Surgeon: Montez Morita, Quillian Quince, MD;  [facility];  Service: Gastroenterology;;   TONSILLECTOMY      Social History   Socioeconomic History   Marital status: Divorced    Spouse name: Not on file   Number of children: Not on file   Years of education: Not on file   Highest education level: Not on file  Occupational History   Not on file  Tobacco Use   Smoking status: Never   Smokeless tobacco: Never  Vaping Use   Vaping Use: Never used  Substance and Sexual Activity   Alcohol use: No   Drug use: No   Sexual activity: Yes    Birth  control/protection: None  Other Topics Concern   Not on file  Social History Narrative   Not on file   Social Determinants of Health   Financial Resource Strain: Low Risk  (01/08/2022)   Overall Financial Resource Strain (CARDIA)    Difficulty of Paying Living Expenses: Not hard at all  Food Insecurity: No Food Insecurity (01/08/2022)   Hunger Vital Sign    Worried About Running Out of Food in the Last Year: Never true    Ran Out of Food in the Last Year: Never true  Transportation Needs: No Transportation Needs (01/08/2022)   PRAPARE - Hydrologist (Medical): No    Lack of Transportation (Non-Medical): No  Physical Activity: Insufficiently Active (01/08/2022)   Exercise Vital Sign    Days of Exercise per Week: 2 days    Minutes of Exercise per Session: 20 min  Stress: No Stress Concern Present (01/08/2022)   [place]    Feeling of Stress : Not at all  Social Connections: Moderately Isolated (01/08/2022)   Social Connection and Isolation Panel [NHANES]    Frequency of Communication with Friends and Family: More than three times a week    Frequency of Social Gatherings with Friends and Family: Once a week    Attends Religious Services: More than 4 times per year    Active Member of [employer] or Organizations: No    Attends Archivist Meetings: Never    Marital Status: Divorced    Family History  Problem Relation Age of Onset   Cancer Mother    Cervical cancer Mother    Cancer Father    Throat cancer Father    Thyroid disease Son    Healthy Daughter    Healthy Son     Outpatient Encounter Medications as of 01/15/2022  Medication Sig   Semaglutide, 1 MG/DOSE, 4 MG/3ML SOPN Inject 1 mg as directed once a week.   Alirocumab (PRALUENT) 75 MG/ML SOAJ INJECT 1 ML UNDER THE SKIN EVERY 14 DAYS   aspirin EC 81 MG tablet Take 81 mg by mouth daily.   clopidogrel (PLAVIX) 75 MG  tablet TAKE 1 TABLET BY MOUTH EVERY DAY   glucose blood (ONETOUCH VERIO) test strip Use to monitor glucose twice daily.   isosorbide mononitrate (IMDUR) 60 MG 24 hr tablet Take 1 tablet (60 mg total) by mouth daily.   Lancets (ONETOUCH DELICA PLUS [id number]) MISC Use to monitor glucose twice daily.   lisinopril (ZESTRIL) 5 MG tablet Take 1 tablet (5 mg total) by mouth daily.   metFORMIN (GLUCOPHAGE-XR) 500 MG 24 hr tablet Take 2 tablets (1,000 mg total) by mouth daily with breakfast.  metoprolol tartrate (LOPRESSOR) 25 MG tablet TAKE 1 TABLET BY MOUTH TWICE A DAY   nitroGLYCERIN (NITROSTAT) 0.4 MG SL tablet PLACE 1 TABLET UNDER THE TONGUE EVERY 5 MINUTES AS NEEDED FOR CHEST PAIN.   rosuvastatin (CRESTOR) 40 MG tablet TAKE 1 TABLET BY MOUTH EVERY DAY   [DISCONTINUED] colchicine 0.6 MG tablet Take 0.6 mg by mouth daily as needed (gout flares). (Patient not taking: Reported on 12/20/2021)   [DISCONTINUED] Semaglutide,0.25 or 0.'5MG'$ /DOS, (OZEMPIC, 0.25 OR 0.5 MG/DOSE,) 2 MG/3ML SOPN Inject 0.5 mg as directed once a week.   No facility-administered encounter medications on file as of 01/15/2022.    ALLERGIES: Allergies  Allergen Reactions   Hydrocodone Nausea Only    VACCINATION STATUS: Immunization History  Administered Date(s) Administered   Fluad Quad(high Dose 65+) 03/23/2020   Influenza, High Dose Seasonal PF 03/12/2021   Influenza, Quadrivalent, Recombinant, Inj, Pf 01/25/2019   Influenza,trivalent, recombinat, inj, PF 01/09/2017   Influenza-Unspecified 01/22/2018   Moderna Sars-Covid-2 Vaccination 05/20/2019, 06/21/2019, 03/23/2020   PNEUMOCOCCAL CONJUGATE-20 06/20/2021   Tdap 03/24/2014   Zoster Recombinat (Shingrix) 10/16/2018, 01/25/2019    Diabetes He presents for his follow-up diabetic visit. He has type 2 diabetes mellitus. Onset time: Recently diagnosed at age 41. His disease course has been improving. There are no hypoglycemic associated symptoms. Associated symptoms  include blurred vision and fatigue. Pertinent negatives for diabetes include no polydipsia, no polyuria and no weight loss. There are no hypoglycemic complications. Symptoms are improving. Diabetic complications include heart disease and nephropathy. Risk factors for coronary artery disease include diabetes mellitus, dyslipidemia, family history, obesity, male sex, hypertension and sedentary lifestyle. Current diabetic treatment includes oral agent (monotherapy) (and Ozempic). He is compliant with treatment most of the time. His weight is fluctuating minimally. He is following a generally healthy diet. When asked about meal planning, he reported none. He has not had a previous visit with a dietitian. He never participates in exercise. His home blood glucose trend is fluctuating minimally. His breakfast blood glucose range is generally 130-140 mg/dl. (He presents today with his meter and logs showing at goal fasting glycemic profile most days.  His previsit A1c on 9/29 was 7%, improving from 7.9%.  He denies any hypoglycemia.  He notes his weight loss has plateaued.  He also alternates between constipation and diarrhea.) An ACE inhibitor/angiotensin II receptor blocker is not being taken. He sees a podiatrist.Eye exam is current.  Hypertension This is a chronic problem. The current episode started more than 1 year ago. The problem has been resolved since onset. The problem is controlled. Associated symptoms include blurred vision. There are no associated agents to hypertension. Risk factors for coronary artery disease include diabetes mellitus, dyslipidemia, family history, obesity and male gender. Past treatments include beta blockers. The current treatment provides mild improvement. Compliance problems include diet and exercise.  Hypertensive end-organ damage includes kidney disease and CAD/MI. Identifiable causes of hypertension include chronic renal disease.  Hyperlipidemia This is a chronic problem. The  current episode started more than 1 year ago. The problem is controlled. Recent lipid tests were reviewed and are normal. Exacerbating diseases include chronic renal disease and diabetes. Factors aggravating his hyperlipidemia include fatty foods and beta blockers. Current antihyperlipidemic treatment includes statins. The current treatment provides moderate improvement of lipids. Compliance problems include adherence to diet and adherence to exercise.  Risk factors for coronary artery disease include diabetes mellitus, dyslipidemia, family history, obesity, male sex and hypertension.     Review of systems  Constitutional: +  increasing body weight,  current Body mass index is 39.78 kg/m. , no fatigue, no subjective hyperthermia, no subjective hypothermia Eyes: no blurry vision, no xerophthalmia ENT: no sore throat, no nodules palpated in throat, no dysphagia/odynophagia, no hoarseness Cardiovascular: no chest pain, no shortness of breath, no palpitations, no leg swelling Respiratory: no cough, no shortness of breath Gastrointestinal: no nausea/vomiting/diarrhea, + alternating constipation/diarrhea Musculoskeletal: no muscle/joint aches Skin: no rashes, no hyperemia Neurological: no tremors, no numbness, no tingling, no dizziness Psychiatric: no depression, no anxiety  Objective:     BP 123/79   Pulse 65   Ht '5\' 7"'$  (1.702 m)   Wt 254 lb (115.2 kg)   BMI 39.78 kg/m   Wt Readings from Last 3 Encounters:  01/15/22 254 lb (115.2 kg)  01/08/22 248 lb (112.5 kg)  12/20/21 248 lb 9.6 oz (112.8 kg)     BP Readings from Last 3 Encounters:  01/15/22 123/79  12/20/21 127/76  10/15/21 129/78     Physical Exam- Limited  Constitutional:  Body mass index is 39.78 kg/m. , not in acute distress, normal state of mind Eyes:  EOMI, no exophthalmos Neck: Supple Cardiovascular: RRR, no murmurs, rubs, or gallops, no edema Respiratory: Adequate breathing efforts, no crackles, rales, rhonchi, or  wheezing Musculoskeletal: no gross deformities, strength intact in all four extremities, no gross restriction of joint movements Skin:  no rashes, no hyperemia Neurological: no tremor with outstretched hands   Diabetic Foot Exam - Simple   No data filed    CMP ( most recent) CMP     Component Value Date/Time   NA 138 09/13/2021 0919   NA 138 06/20/2021 0919   K 4.2 09/13/2021 0919   CL 105 09/13/2021 0919   CO2 27 09/13/2021 0919   GLUCOSE 140 (H) 09/13/2021 0919   BUN 13 09/13/2021 0919   BUN 12 06/20/2021 0919   CREATININE 0.87 09/13/2021 0919   CREATININE 1.00 05/07/2015 0834   CALCIUM 9.3 09/13/2021 0919   PROT 7.4 06/20/2021 0919   ALBUMIN 5.0 (H) 06/20/2021 0919   AST 27 06/20/2021 0919   ALT 24 06/20/2021 0919   ALKPHOS 116 06/20/2021 0919   BILITOT 1.1 06/20/2021 0919   GFRNONAA >60 09/13/2021 0919   GFRAA 88 11/16/2018 1027     Diabetic Labs (most recent): Lab Results  Component Value Date   HGBA1C 7.0 (H) 12/20/2021   HGBA1C 7.9 (H) 09/20/2021   HGBA1C 11.4 (H) 06/20/2021     Lipid Panel ( most recent) Lipid Panel     Component Value Date/Time   CHOL 115 06/20/2021 0919   TRIG 156 (H) 06/20/2021 0919   HDL 42 06/20/2021 0919   CHOLHDL 2.7 06/20/2021 0919   CHOLHDL 3.4 05/07/2015 0834   VLDL 16 05/07/2015 0834   LDLCALC 47 06/20/2021 0919   LABVLDL 26 06/20/2021 0919      Lab Results  Component Value Date   TSH 2.270 06/27/2020   TSH 1.478 05/03/2011           Assessment & Plan:   1) Type 2 diabetes mellitus with hyperglycemia, without long-term current use of insulin ([place])  He presents today with his meter and logs showing at goal fasting glycemic profile most days.  His previsit A1c on 9/29 was 7%, improving from 7.9%.  He denies any hypoglycemia.  He notes his weight loss has plateaued.  He also alternates between constipation and diarrhea.  - [name] has currently uncontrolled symptomatic type 2 DM since  66 years of age.    -Recent labs reviewed.  - I had a long discussion with him about the progressive nature of diabetes and the pathology behind its complications. -his diabetes is complicated by CAD with MI and mild CKD and he remains at a high risk for more acute and chronic complications which include CAD, CVA, CKD, retinopathy, and neuropathy. These are all discussed in detail with him.  The following Lifestyle Medicine recommendations according to [facility]) were discussed and offered to patient and he agrees to start the journey:  A. Whole Foods, Plant-based plate comprising of fruits and vegetables, plant-based proteins, whole-grain carbohydrates was discussed in detail with the patient.   A list for source of those nutrients were also provided to the patient.  Patient will use only water or unsweetened tea for hydration. B.  The need to stay away from risky substances including alcohol, smoking; obtaining 7 to 9 hours of restorative sleep, at least 150 minutes of moderate intensity exercise weekly, the importance of healthy social connections,  and stress reduction techniques were discussed. C.  A full color page of  Calorie density of various food groups per pound showing examples of each food groups was provided to the patient.  - Nutritional counseling repeated at each appointment due to patients tendency to fall back in to old habits.  - The patient admits there is a room for improvement in their diet and drink choices. -  Suggestion is made for the patient to avoid simple carbohydrates from their diet including Cakes, Sweet Desserts / Pastries, Ice Cream, Soda (diet and regular), Sweet Tea, Candies, Chips, Cookies, Sweet Pastries, Store Bought Juices, Alcohol in Excess of 1-2 drinks a day, Artificial Sweeteners, Coffee Creamer, and "Sugar-free" Products. This will help patient to have stable blood glucose profile and potentially avoid unintended weight gain.   - I  encouraged the patient to switch to unprocessed or minimally processed complex starch and increased protein intake (animal or plant source), fruits, and vegetables.   - Patient is advised to stick to a routine mealtimes to eat 3 meals a day and avoid unnecessary snacks (to snack only to correct hypoglycemia).  - I have approached him with the following individualized plan to manage his diabetes and patient agrees:   - He is advised to continue Metformin 1000 mg ER daily with breakfast.  Will increase his Ozempic to 1 mg SQ weekly to further improve his A1c.  He is advised to incorporate more fibrous veggies and fruits and water into his diet to avoid intermittent constipation that sometimes occurs with GLP1.  -he is encouraged to continue monitoring blood glucose daily, before breakfast, and to call the clinic if he has readings less than 70 or above 300 for 3 tests in a row.  He is on safe regimen and doesn't need to monitor glucose every day but prefers to continue to prevent losing control.  - Adjustment parameters are given to him for hypo and hyperglycemia in writing.  - Specific targets for  A1c; LDL, HDL, and Triglycerides were discussed with the patient.  2) Blood Pressure /Hypertension:  his blood pressure is controlled to target.   he is advised to continue his current medications including Metoprolol 25 mg p.o. twice daily.  3) Lipids/Hyperlipidemia:    Review of his recent lipid panel from 06/20/21 showed controlled LDL at 47 and slightly elevated triglycerides of 156 .  he is advised to continue Crestor 40 mg daily  at bedtime.  Side effects and precautions discussed with him.  4)  Weight/Diet:  his Body mass index is 39.78 kg/m.  -  clearly complicating his diabetes care.   he is a candidate for weight loss. I discussed with him the fact that loss of 5 - 10% of his  current body weight will have the most impact on his diabetes management.  Exercise, and detailed carbohydrates  information provided  -  detailed on discharge instructions.  5) Chronic Care/Health Maintenance: -he is not on ACEI/ARB and is on Statin medications and is encouraged to initiate and continue to follow up with Ophthalmology, Dentist, Podiatrist at least yearly or according to recommendations, and advised to stay away from smoking. I have recommended yearly flu vaccine and pneumonia vaccine at least every 5 years; moderate intensity exercise for up to 150 minutes weekly; and sleep for at least 7 hours a day.  - he is advised to maintain close follow up with Coral Spikes, DO for primary care needs, as well as his other providers for optimal and coordinated care.      I spent 35 minutes in the care of the patient today including review of labs from [place], Lipids, Thyroid Function, Hematology (current and previous including abstractions from other facilities); face-to-face time discussing  his blood glucose readings/logs, discussing hypoglycemia and hyperglycemia episodes and symptoms, medications doses, his options of short and long term treatment based on the latest standards of care / guidelines;  discussion about incorporating lifestyle medicine;  and documenting the encounter. Risk reduction counseling performed per USPSTF guidelines to reduce obesity and cardiovascular risk factors.     Please refer to Patient Instructions for Blood Glucose Monitoring and Insulin/Medications Dosing Guide"  in media tab for additional information. Please  also refer to " Patient Self Inventory" in the Media  tab for reviewed elements of pertinent patient history.  Rodena Medin participated in the discussions, expressed understanding, and voiced agreement with the above plans.  All questions were answered to his satisfaction. he is encouraged to contact clinic should he have any questions or concerns prior to his return visit.     Follow up plan: - Return in about 4 months (around 05/18/2022) for Diabetes F/U  with A1c in office, No previsit labs, Bring meter and logs.   Rayetta Pigg, [facility] [address] [place], [place] [address] Phone: [phone] Fax: [phone]  01/15/2022, 10:21 AM

## 2022-02-26 DIAGNOSIS — Z23 Encounter for immunization: Secondary | ICD-10-CM | POA: Diagnosis not present

## 2022-03-16 ENCOUNTER — Other Ambulatory Visit: Payer: Self-pay | Admitting: Family Medicine

## 2022-03-21 ENCOUNTER — Ambulatory Visit (INDEPENDENT_AMBULATORY_CARE_PROVIDER_SITE_OTHER): Payer: Medicare Other | Admitting: Family Medicine

## 2022-03-21 VITALS — BP 148/76 | HR 68 | Temp 98.4°F | Ht 67.0 in | Wt 250.0 lb

## 2022-03-21 DIAGNOSIS — I25119 Atherosclerotic heart disease of native coronary artery with unspecified angina pectoris: Secondary | ICD-10-CM

## 2022-03-21 DIAGNOSIS — E782 Mixed hyperlipidemia: Secondary | ICD-10-CM

## 2022-03-21 DIAGNOSIS — I10 Essential (primary) hypertension: Secondary | ICD-10-CM | POA: Diagnosis not present

## 2022-03-21 DIAGNOSIS — E1165 Type 2 diabetes mellitus with hyperglycemia: Secondary | ICD-10-CM

## 2022-03-21 MED ORDER — LOSARTAN POTASSIUM 25 MG PO TABS: 25.0000 mg | ORAL_TABLET | Freq: Every day | ORAL | 3 refills | Status: DC

## 2022-03-21 NOTE — Progress Notes (Signed)
Subjective:  Patient ID: [name], male    DOB: [birth year]  Age: 66 y.o. MRN: [id number]  CC: Chief Complaint  Patient presents with   Hypertension    Lisinopril causing dry cough BP 129/75 this morning   Diabetes    126 FBS today     HPI:  66 year old male with CAD s/p CABG, HTN, obesity, GERD, hepatic steatosis, type 2 diabetes, hyperlipidemia presents for follow-up.  Patient's home blood pressure readings have been well-controlled.  BP mildly elevated here today.  He has been experiencing cough from lisinopril.  Will discuss change in therapy today.  CAD is stable.  No chest pain or shortness of breath.  Most recent A1c at goal.  Blood sugar this morning was 126.  He is on Ozempic 1 mg weekly and also on metformin XR, total 1000 mg daily.  No reported side effects.  Lipids have been stable and LDL has been at goal on Crestor.  Patient Active Problem List   Diagnosis Date Noted   Mixed hyperlipidemia 06/20/2021   History of gout 06/20/2021   Type 2 diabetes mellitus with hyperglycemia ([place]) 06/20/2021   Coronary artery disease involving native coronary artery with angina pectoris ([place])    Obesity    GERD (gastroesophageal reflux disease) 07/12/2012   Fatty liver 07/07/2011   Essential hypertension 08/22/2008    Social Hx   Social History   Socioeconomic History   Marital status: Divorced    Spouse name: Not on file   Number of children: Not on file   Years of education: Not on file   Highest education level: Not on file  Occupational History   Not on file  Tobacco Use   Smoking status: Never   Smokeless tobacco: Never  Vaping Use   Vaping Use: Never used  Substance and Sexual Activity   Alcohol use: No   Drug use: No   Sexual activity: Yes    Birth control/protection: None  Other Topics Concern   Not on file  Social History Narrative   Not on file   Social Determinants of Health   Financial Resource Strain: Low Risk  (01/08/2022)   Overall  Financial Resource Strain (CARDIA)    Difficulty of Paying Living Expenses: Not hard at all  Food Insecurity: No Food Insecurity (01/08/2022)   Hunger Vital Sign    Worried About Running Out of Food in the Last Year: Never true    Ran Out of Food in the Last Year: Never true  Transportation Needs: No Transportation Needs (01/08/2022)   PRAPARE - Hydrologist (Medical): No    Lack of Transportation (Non-Medical): No  Physical Activity: Insufficiently Active (01/08/2022)   Exercise Vital Sign    Days of Exercise per Week: 2 days    Minutes of Exercise per Session: 20 min  Stress: No Stress Concern Present (01/08/2022)   [place]    Feeling of Stress : Not at all  Social Connections: Moderately Isolated (01/08/2022)   Social Connection and Isolation Panel [NHANES]    Frequency of Communication with Friends and Family: More than three times a week    Frequency of Social Gatherings with Friends and Family: Once a week    Attends Religious Services: More than 4 times per year    Active Member of [employer] or Organizations: No    Attends Archivist Meetings: Never    Marital Status: Divorced  Review of Systems Per HPI  Objective:  BP (!) 148/76   Pulse 68   Temp 98.4 F (36.9 C)   Ht '5\' 7"'$  (1.702 m)   Wt 250 lb (113.4 kg)   SpO2 98%   BMI 39.16 kg/m      03/21/2022    8:30 AM 01/15/2022    9:53 AM 01/08/2022    1:26 PM  BP/Weight  Systolic BP 876 811   Diastolic BP 76 79   Wt. (Lbs) 250 254 248  BMI 39.16 kg/m2 39.78 kg/m2 38.84 kg/m2    Physical Exam Vitals and nursing note reviewed.  Constitutional:      General: He is not in acute distress.    Appearance: Normal appearance. He is obese.  Eyes:     General:        Right eye: No discharge.        Left eye: No discharge.     Conjunctiva/sclera: Conjunctivae normal.  Cardiovascular:     Rate and Rhythm:  Normal rate and regular rhythm.  Pulmonary:     Effort: Pulmonary effort is normal.     Breath sounds: Normal breath sounds. No wheezing or rales.  Neurological:     Mental Status: He is alert.  Psychiatric:        Mood and Affect: Mood normal.        Behavior: Behavior normal.    Lab Results  Component Value Date   WBC 7.4 06/20/2021   HGB 14.7 06/20/2021   HCT 44.2 06/20/2021   PLT 220 06/20/2021   GLUCOSE 140 (H) 09/13/2021   CHOL 115 06/20/2021   TRIG 156 (H) 06/20/2021   HDL 42 06/20/2021   LDLCALC 47 06/20/2021   ALT 24 06/20/2021   AST 27 06/20/2021   NA 138 09/13/2021   K 4.2 09/13/2021   CL 105 09/13/2021   CREATININE 0.87 09/13/2021   BUN 13 09/13/2021   CO2 27 09/13/2021   TSH 2.270 06/27/2020   INR 1.05 06/06/2013   HGBA1C 7.0 (H) 12/20/2021     Assessment & Plan:   Problem List Items Addressed This Visit       Cardiovascular and Mediastinum   Coronary artery disease involving native coronary artery with angina pectoris ([place])    Stable and asymptomatic.  Continue current medications.      Relevant Medications   losartan (COZAAR) 25 MG tablet   Essential hypertension    Experiencing side effect of dry cough from lisinopril.  Stopping lisinopril.  Starting losartan.      Relevant Medications   losartan (COZAAR) 25 MG tablet     Endocrine   Type 2 diabetes mellitus with hyperglycemia ([facility]) - Primary    Last A1c at goal.  Repeat A1c today.  Discussed increasing Ozempic and/or increasing metformin.  Patient wants to wait at this time.  He will continue dietary and lifestyle changes and his current dosing of his medication.      Relevant Medications   losartan (COZAAR) 25 MG tablet   Other Relevant Orders   Hemoglobin A1c     Other   Mixed hyperlipidemia    LDL at goal on Crestor.  Continue.      Relevant Medications   losartan (COZAAR) 25 MG tablet    Meds ordered this encounter  Medications   losartan (COZAAR) 25 MG tablet    Sig:  Take 1 tablet (25 mg total) by mouth daily.    Dispense:  90 tablet  Refill:  3    Follow-up:  3-6 months.  [place]

## 2022-03-21 NOTE — Assessment & Plan Note (Signed)
Last A1c at goal.  Repeat A1c today.  Discussed increasing Ozempic and/or increasing metformin.  Patient wants to wait at this time.  He will continue dietary and lifestyle changes and his current dosing of his medication.

## 2022-03-21 NOTE — Patient Instructions (Signed)
A1C today.  Stop Lisinopril. Start Losartan.  Follow up in 3-6 months.  Take care  Dr. Lacinda Axon

## 2022-03-21 NOTE — Assessment & Plan Note (Signed)
Stable and asymptomatic.  Continue current medications.

## 2022-03-21 NOTE — Assessment & Plan Note (Signed)
Experiencing side effect of dry cough from lisinopril.  Stopping lisinopril.  Starting losartan.

## 2022-03-21 NOTE — Assessment & Plan Note (Signed)
LDL at goal on Crestor.  Continue.

## 2022-03-22 LAB — HEMOGLOBIN A1C
Est. average glucose Bld gHb Est-mCnc: 151 mg/dL
Hgb A1c MFr Bld: 6.9 % — ABNORMAL HIGH (ref 4.8–5.6)

## 2022-04-15 ENCOUNTER — Telehealth: Payer: Self-pay

## 2022-04-15 NOTE — Telephone Encounter (Signed)
Patient called and wanted you to know due to a nationwide shortage he is unable to get his Ozempic. Patient states he has called four different pharmacies and has been told the same thing from each one. Please advise.

## 2022-04-17 NOTE — Telephone Encounter (Signed)
Called patient and advised per Dr Lacinda Axon he could try Trulicity. Patient agrees with recommendations. Patient requested that the [facility] on [address] be used.

## 2022-04-18 ENCOUNTER — Other Ambulatory Visit: Payer: Self-pay | Admitting: Family Medicine

## 2022-04-18 MED ORDER — TRULICITY 0.75 MG/0.5ML ~~LOC~~ SOAJ: 0.7500 mg | SUBCUTANEOUS | 0 refills | Status: DC

## 2022-05-14 DIAGNOSIS — E119 Type 2 diabetes mellitus without complications: Secondary | ICD-10-CM | POA: Diagnosis not present

## 2022-05-14 LAB — HM DIABETES EYE EXAM

## 2022-05-21 ENCOUNTER — Encounter: Payer: Self-pay | Admitting: Nurse Practitioner

## 2022-05-21 ENCOUNTER — Ambulatory Visit (INDEPENDENT_AMBULATORY_CARE_PROVIDER_SITE_OTHER): Payer: Medicare Other | Admitting: Nurse Practitioner

## 2022-05-21 VITALS — BP 126/73 | HR 71 | Ht 67.0 in | Wt 256.5 lb

## 2022-05-21 DIAGNOSIS — E782 Mixed hyperlipidemia: Secondary | ICD-10-CM | POA: Diagnosis not present

## 2022-05-21 DIAGNOSIS — E559 Vitamin D deficiency, unspecified: Secondary | ICD-10-CM

## 2022-05-21 DIAGNOSIS — I10 Essential (primary) hypertension: Secondary | ICD-10-CM

## 2022-05-21 DIAGNOSIS — E1165 Type 2 diabetes mellitus with hyperglycemia: Secondary | ICD-10-CM | POA: Diagnosis not present

## 2022-05-21 LAB — POCT UA - MICROALBUMIN
Creatinine, POC: 10 mg/dL
Microalbumin Ur, POC: 80 mg/L

## 2022-05-21 MED ORDER — METFORMIN HCL ER 500 MG PO TB24: 1000.0000 mg | ORAL_TABLET | Freq: Every day | ORAL | 3 refills | Status: DC

## 2022-05-21 MED ORDER — TIRZEPATIDE 5 MG/0.5ML ~~LOC~~ SOAJ: 5.0000 mg | SUBCUTANEOUS | 0 refills | Status: DC

## 2022-05-21 NOTE — Progress Notes (Signed)
Endocrinology Follow Up Note       05/21/2022, 11:29 AM   Subjective:    Patient ID: [name], male    DOB: January 17, 1956.  [name] is being seen in follow up after being seen in consultation for management of currently uncontrolled symptomatic diabetes requested by  Coral Spikes, DO.   Past Medical History:  Diagnosis Date   Arthritis    CAD (coronary artery disease) 2000   a. History of MI ~2000 s/p PCI. b. CABG in 2006 (LIMA-LAD, RIMA-acute marginal, SVG-DX, SVG-RCA). c. NSTEMI 05/2013:severe stenoses of the SVG-PDA and SVG-diagonal, both treated successfully with PCI (DES x2).   Diabetes ([place])    GERD (gastroesophageal reflux disease)    Gout    History of hypertension    Hydrocele 2012   Hyperlipidemia    Hypertension    Myocardial infarction [facility]) 2014   x3   Obesity    S/P coronary angioplasty 11/10/17 11/11/2017    Past Surgical History:  Procedure Laterality Date   BIOPSY  09/17/2021   Procedure: BIOPSY;  Surgeon: Harvel Quale, MD;  Location: [facility];  Service: Gastroenterology;;   CATARACT EXTRACTION W/PHACO Left 05/17/2012   Procedure: CATARACT EXTRACTION PHACO AND INTRAOCULAR LENS PLACEMENT ([place]);  Surgeon: Tonny Branch, MD;  Location: [facility] ORS;  Service: Ophthalmology;  Laterality: Left;  CDE:  0.76   CATARACT EXTRACTION W/PHACO Right 08/26/2012   Procedure: CATARACT EXTRACTION PHACO AND INTRAOCULAR LENS PLACEMENT ([facility]);  Surgeon: Tonny Branch, MD;  Location: [facility] ORS;  Service: Ophthalmology;  Laterality: Right;  CDE: 1.13   COLONOSCOPY WITH PROPOFOL N/A 09/17/2021   Procedure: COLONOSCOPY WITH PROPOFOL;  Surgeon: Harvel Quale, MD;  Location: [facility];  Service: Gastroenterology;  Laterality: N/A;  [place]   stents x2   CORONARY ARTERY BYPASS GRAFT  2006   4 vessels   Coronary artery bypass grafting  May 2006   Four-vessel    CORONARY BALLOON ANGIOPLASTY N/A 11/10/2017   Procedure: CORONARY BALLOON ANGIOPLASTY;  Surgeon: [name], MD;  Location: [place] CV LAB;  Service: Cardiovascular;  Laterality: N/A;   FOOT FRACTURE SURGERY     right   HEMOSTASIS CLIP PLACEMENT  09/17/2021   Procedure: HEMOSTASIS CLIP PLACEMENT;  Surgeon: Harvel Quale, MD;  Location: [facility];  Service: Gastroenterology;;   LEFT HEART CATH AND CORS/GRAFTS ANGIOGRAPHY N/A 11/10/2017   Procedure: LEFT HEART CATH AND CORS/GRAFTS ANGIOGRAPHY;  Surgeon: [name], MD;  Location: [place] CV LAB;  Service: Cardiovascular;  Laterality: N/A;   LEFT HEART CATHETERIZATION WITH CORONARY/GRAFT ANGIOGRAM N/A 05/05/2011   Procedure: LEFT HEART CATHETERIZATION WITH Beatrix Fetters;  Surgeon: Josue Hector, MD;  Location: [facility] CATH LAB;  Service: Cardiovascular;  Laterality: N/A;   LEFT HEART CATHETERIZATION WITH CORONARY/GRAFT ANGIOGRAM N/A 06/06/2013   Procedure: LEFT HEART CATHETERIZATION WITH Beatrix Fetters;  Surgeon: Blane Ohara, MD;  Location: [facility] CATH LAB;  Service: Cardiovascular;  Laterality: N/A;   MASS EXCISION Right 05/14/2016   Procedure: EXCISION MASS RIGHT ELBOW;  Surgeon: Carole Civil, MD;  Location: [facility];  Service: Orthopedics;  Laterality: Right;   PILONIDAL CYST  EXCISION     POLYPECTOMY  09/17/2021   Procedure: POLYPECTOMY;  Surgeon: Montez Morita, Quillian Quince, MD;  [facility];  Service: Gastroenterology;;   TONSILLECTOMY      Social History   Socioeconomic History   Marital status: Divorced    Spouse name: Not on file   Number of children: Not on file   Years of education: Not on file   Highest education level: Not on file  Occupational History   Not on file  Tobacco Use   Smoking status: Never   Smokeless tobacco: Never  Vaping Use   Vaping Use: Never used  Substance and Sexual Activity   Alcohol use: No   Drug use: No   Sexual activity: Yes    Birth  control/protection: None  Other Topics Concern   Not on file  Social History Narrative   Not on file   Social Determinants of Health   Financial Resource Strain: Low Risk  (01/08/2022)   Overall Financial Resource Strain (CARDIA)    Difficulty of Paying Living Expenses: Not hard at all  Food Insecurity: No Food Insecurity (01/08/2022)   Hunger Vital Sign    Worried About Running Out of Food in the Last Year: Never true    Ran Out of Food in the Last Year: Never true  Transportation Needs: No Transportation Needs (01/08/2022)   PRAPARE - Hydrologist (Medical): No    Lack of Transportation (Non-Medical): No  Physical Activity: Insufficiently Active (01/08/2022)   Exercise Vital Sign    Days of Exercise per Week: 2 days    Minutes of Exercise per Session: 20 min  Stress: No Stress Concern Present (01/08/2022)   [place]    Feeling of Stress : Not at all  Social Connections: Moderately Isolated (01/08/2022)   Social Connection and Isolation Panel [NHANES]    Frequency of Communication with Friends and Family: More than three times a week    Frequency of Social Gatherings with Friends and Family: Once a week    Attends Religious Services: More than 4 times per year    Active Member of [employer] or Organizations: No    Attends Archivist Meetings: Never    Marital Status: Divorced    Family History  Problem Relation Age of Onset   Cancer Mother    Cervical cancer Mother    Cancer Father    Throat cancer Father    Thyroid disease Son    Healthy Daughter    Healthy Son     Outpatient Encounter Medications as of 05/21/2022  Medication Sig   Alirocumab (PRALUENT) 75 MG/ML SOAJ INJECT 1 ML UNDER THE SKIN EVERY 14 DAYS   aspirin EC 81 MG tablet Take 81 mg by mouth daily.   clopidogrel (PLAVIX) 75 MG tablet TAKE 1 TABLET BY MOUTH EVERY DAY   glucose blood (ONETOUCH VERIO) test  strip Use to monitor glucose twice daily.   isosorbide mononitrate (IMDUR) 60 MG 24 hr tablet Take 1 tablet (60 mg total) by mouth daily.   Lancets (ONETOUCH DELICA PLUS 123XX123) MISC Use to monitor glucose twice daily.   losartan (COZAAR) 25 MG tablet Take 1 tablet (25 mg total) by mouth daily.   metoprolol tartrate (LOPRESSOR) 25 MG tablet TAKE 1 TABLET BY MOUTH TWICE A DAY   nitroGLYCERIN (NITROSTAT) 0.4 MG SL tablet PLACE 1 TABLET UNDER THE TONGUE EVERY 5 MINUTES AS NEEDED FOR CHEST PAIN.  rosuvastatin (CRESTOR) 40 MG tablet TAKE 1 TABLET BY MOUTH EVERY DAY   tirzepatide (MOUNJARO) 5 MG/0.5ML Pen Inject 5 mg into the skin once a week.   [DISCONTINUED] metFORMIN (GLUCOPHAGE-XR) 500 MG 24 hr tablet TAKE 2 TABLETS BY MOUTH EVERY DAY WITH BREAKFAST   metFORMIN (GLUCOPHAGE-XR) 500 MG 24 hr tablet Take 2 tablets (1,000 mg total) by mouth daily with breakfast.   [DISCONTINUED] Dulaglutide (TRULICITY) A999333 0000000 SOPN Inject 0.75 mg into the skin once a week. (Patient not taking: Reported on 05/21/2022)   No facility-administered encounter medications on file as of 05/21/2022.    ALLERGIES: Allergies  Allergen Reactions   Hydrocodone Nausea Only    VACCINATION STATUS: Immunization History  Administered Date(s) Administered   Fluad Quad(high Dose 65+) 03/23/2020   Influenza, High Dose Seasonal PF 03/12/2021   Influenza, Quadrivalent, Recombinant, Inj, Pf 01/25/2019   Influenza,trivalent, recombinat, inj, PF 01/09/2017   Influenza-Unspecified 01/22/2018   Moderna Sars-Covid-2 Vaccination 05/20/2019, 06/21/2019, 03/23/2020   PNEUMOCOCCAL CONJUGATE-20 06/20/2021   Tdap 03/24/2014   Zoster Recombinat (Shingrix) 10/16/2018, 01/25/2019    Diabetes He presents for his follow-up diabetic visit. He has type 2 diabetes mellitus. Onset time: Recently diagnosed at age 57. His disease course has been stable. There are no hypoglycemic associated symptoms. Associated symptoms include blurred  vision and fatigue. Pertinent negatives for diabetes include no polydipsia, no polyuria and no weight loss. There are no hypoglycemic complications. Symptoms are improving. Diabetic complications include heart disease and nephropathy. Risk factors for coronary artery disease include diabetes mellitus, dyslipidemia, family history, obesity, male sex, hypertension and sedentary lifestyle. Current diabetic treatment includes oral agent (monotherapy) (and Ozempic). He is compliant with treatment most of the time. His weight is increasing steadily. He is following a generally healthy diet. When asked about meal planning, he reported none. He has not had a previous visit with a dietitian. He never participates in exercise. His home blood glucose trend is fluctuating minimally. His breakfast blood glucose range is generally 140-180 mg/dl. (He presents today with his meter and logs showing slightly above target fasting glycemic profile.  His most recent A1c on 03/21/22 was 6.9%, essentially unchanged from previous visit.  He notes he was sick over the holidays.  He was changed to Trulicity by his PCP due to pharmacy availability for [place] but never needed to start it as they had his Ozempic available.) An ACE inhibitor/angiotensin II receptor blocker is not being taken. He sees a podiatrist.Eye exam is current.  Hypertension This is a chronic problem. The current episode started more than 1 year ago. The problem has been resolved since onset. The problem is controlled. Associated symptoms include blurred vision. There are no associated agents to hypertension. Risk factors for coronary artery disease include diabetes mellitus, dyslipidemia, family history, obesity and male gender. Past treatments include beta blockers. The current treatment provides mild improvement. Compliance problems include diet and exercise.  Hypertensive end-organ damage includes kidney disease and CAD/MI. Identifiable causes of hypertension  include chronic renal disease.  Hyperlipidemia This is a chronic problem. The current episode started more than 1 year ago. The problem is controlled. Recent lipid tests were reviewed and are normal. Exacerbating diseases include chronic renal disease and diabetes. Factors aggravating his hyperlipidemia include fatty foods and beta blockers. Current antihyperlipidemic treatment includes statins. The current treatment provides moderate improvement of lipids. Compliance problems include adherence to diet and adherence to exercise.  Risk factors for coronary artery disease include diabetes mellitus, dyslipidemia, family history, obesity, male  sex and hypertension.     Review of systems  Constitutional: + increasing body weight,  current Body mass index is 40.17 kg/m. , no fatigue, no subjective hyperthermia, no subjective hypothermia Eyes: no blurry vision, no xerophthalmia ENT: no sore throat, no nodules palpated in throat, no dysphagia/odynophagia, no hoarseness Cardiovascular: no chest pain, no shortness of breath, no palpitations, no leg swelling Respiratory: no cough, no shortness of breath Gastrointestinal: no nausea/vomiting/diarrhea, + alternating constipation/diarrhea Musculoskeletal: no muscle/joint aches Skin: no rashes, no hyperemia Neurological: no tremors, no numbness, no tingling, no dizziness Psychiatric: no depression, no anxiety  Objective:     BP 126/73 (BP Location: Left Arm, Patient Position: Sitting, Cuff Size: Large)   Pulse 71   Ht '5\' 7"'$  (1.702 m)   Wt 256 lb 8 oz (116.3 kg)   BMI 40.17 kg/m   Wt Readings from Last 3 Encounters:  05/21/22 256 lb 8 oz (116.3 kg)  03/21/22 250 lb (113.4 kg)  01/15/22 254 lb (115.2 kg)     BP Readings from Last 3 Encounters:  05/21/22 126/73  03/21/22 (!) 148/76  01/15/22 123/79     Physical Exam- Limited  Constitutional:  Body mass index is 40.17 kg/m. , not in acute distress, normal state of mind Eyes:  EOMI, no  exophthalmos Neck: Supple Musculoskeletal: no gross deformities, strength intact in all four extremities, no gross restriction of joint movements Skin:  no rashes, no hyperemia Neurological: no tremor with outstretched hands   Diabetic Foot Exam - Simple   No data filed    CMP ( most recent) CMP     Component Value Date/Time   NA 138 09/13/2021 0919   NA 138 06/20/2021 0919   K 4.2 09/13/2021 0919   CL 105 09/13/2021 0919   CO2 27 09/13/2021 0919   GLUCOSE 140 (H) 09/13/2021 0919   BUN 13 09/13/2021 0919   BUN 12 06/20/2021 0919   CREATININE 0.87 09/13/2021 0919   CREATININE 1.00 05/07/2015 0834   CALCIUM 9.3 09/13/2021 0919   PROT 7.4 06/20/2021 0919   ALBUMIN 5.0 (H) 06/20/2021 0919   AST 27 06/20/2021 0919   ALT 24 06/20/2021 0919   ALKPHOS 116 06/20/2021 0919   BILITOT 1.1 06/20/2021 0919   GFRNONAA >60 09/13/2021 0919   GFRAA 88 11/16/2018 1027     Diabetic Labs (most recent): Lab Results  Component Value Date   HGBA1C 6.9 (H) 03/21/2022   HGBA1C 7.0 (H) 12/20/2021   HGBA1C 7.9 (H) 09/20/2021   MICROALBUR 80 mg/L 05/21/2022     Lipid Panel ( most recent) Lipid Panel     Component Value Date/Time   CHOL 115 06/20/2021 0919   TRIG 156 (H) 06/20/2021 0919   HDL 42 06/20/2021 0919   CHOLHDL 2.7 06/20/2021 0919   CHOLHDL 3.4 05/07/2015 0834   VLDL 16 05/07/2015 0834   LDLCALC 47 06/20/2021 0919   LABVLDL 26 06/20/2021 0919      Lab Results  Component Value Date   TSH 2.270 06/27/2020   TSH 1.478 05/03/2011           Assessment & Plan:   1) Type 2 diabetes mellitus with hyperglycemia, without long-term current use of insulin ([place])  He presents today with his meter and logs showing slightly above target fasting glycemic profile.  His most recent A1c on 03/21/22 was 6.9%, essentially unchanged from previous visit.  He notes he was sick over the holidays.  He was changed to Trulicity by his PCP due  to pharmacy availability for [place] but never  needed to start it as they had his Ozempic available.  - Hussam L Ronchetti has currently uncontrolled symptomatic type 2 DM since 67 years of age.   -Recent labs reviewed.  - I had a long discussion with him about the progressive nature of diabetes and the pathology behind its complications. -his diabetes is complicated by CAD with MI and mild CKD and he remains at a high risk for more acute and chronic complications which include CAD, CVA, CKD, retinopathy, and neuropathy. These are all discussed in detail with him.  The following Lifestyle Medicine recommendations according to [facility]) were discussed and offered to patient and he agrees to start the journey:  A. Whole Foods, Plant-based plate comprising of fruits and vegetables, plant-based proteins, whole-grain carbohydrates was discussed in detail with the patient.   A list for source of those nutrients were also provided to the patient.  Patient will use only water or unsweetened tea for hydration. B.  The need to stay away from risky substances including alcohol, smoking; obtaining 7 to 9 hours of restorative sleep, at least 150 minutes of moderate intensity exercise weekly, the importance of healthy social connections,  and stress reduction techniques were discussed. C.  A full color page of  Calorie density of various food groups per pound showing examples of each food groups was provided to the patient.  - Nutritional counseling repeated at each appointment due to patients tendency to fall back in to old habits.  - The patient admits there is a room for improvement in their diet and drink choices. -  Suggestion is made for the patient to avoid simple carbohydrates from their diet including Cakes, Sweet Desserts / Pastries, Ice Cream, Soda (diet and regular), Sweet Tea, Candies, Chips, Cookies, Sweet Pastries, Store Bought Juices, Alcohol in Excess of 1-2 drinks a day, Artificial Sweeteners, Coffee  Creamer, and "Sugar-free" Products. This will help patient to have stable blood glucose profile and potentially avoid unintended weight gain.   - I encouraged the patient to switch to unprocessed or minimally processed complex starch and increased protein intake (animal or plant source), fruits, and vegetables.   - Patient is advised to stick to a routine mealtimes to eat 3 meals a day and avoid unnecessary snacks (to snack only to correct hypoglycemia).  - I have approached him with the following individualized plan to manage his diabetes and patient agrees:   - He is advised to continue Metformin 1000 mg ER daily with breakfast.  Will change his Ozempic to Mounjaro 5 mg SQ weekly (he could benefit from GIP therapy instead of GLP therapy).   -he is encouraged to continue monitoring blood glucose daily, before breakfast, and to call the clinic if he has readings less than 70 or above 300 for 3 tests in a row.  He is on safe regimen and doesn't need to monitor glucose every day but prefers to continue to prevent losing control.  - Adjustment parameters are given to him for hypo and hyperglycemia in writing.  - Specific targets for  A1c; LDL, HDL, and Triglycerides were discussed with the patient.  2) Blood Pressure /Hypertension:  his blood pressure is controlled to target.   he is advised to continue his current medications including Metoprolol 25 mg p.o. twice daily.  3) Lipids/Hyperlipidemia:    Review of his recent lipid panel from 06/20/21 showed controlled LDL at 47 and slightly elevated triglycerides of  156 .  he is advised to continue Crestor 40 mg daily at bedtime.  Side effects and precautions discussed with him.  4)  Weight/Diet:  his Body mass index is 40.17 kg/m.  -  clearly complicating his diabetes care.   he is a candidate for weight loss. I discussed with him the fact that loss of 5 - 10% of his  current body weight will have the most impact on his diabetes management.   Exercise, and detailed carbohydrates information provided  -  detailed on discharge instructions.  5) Chronic Care/Health Maintenance: -he is not on ACEI/ARB and is on Statin medications and is encouraged to initiate and continue to follow up with Ophthalmology, Dentist, Podiatrist at least yearly or according to recommendations, and advised to stay away from smoking. I have recommended yearly flu vaccine and pneumonia vaccine at least every 5 years; moderate intensity exercise for up to 150 minutes weekly; and sleep for at least 7 hours a day.  - he is advised to maintain close follow up with Coral Spikes, DO for primary care needs, as well as his other providers for optimal and coordinated care.      I spent  30  minutes in the care of the patient today including review of labs from [place], Lipids, Thyroid Function, Hematology (current and previous including abstractions from other facilities); face-to-face time discussing  his blood glucose readings/logs, discussing hypoglycemia and hyperglycemia episodes and symptoms, medications doses, his options of short and long term treatment based on the latest standards of care / guidelines;  discussion about incorporating lifestyle medicine;  and documenting the encounter. Risk reduction counseling performed per USPSTF guidelines to reduce obesity and cardiovascular risk factors.     Please refer to Patient Instructions for Blood Glucose Monitoring and Insulin/Medications Dosing Guide"  in media tab for additional information. Please  also refer to " Patient Self Inventory" in the Media  tab for reviewed elements of pertinent patient history.  Rodena Medin participated in the discussions, expressed understanding, and voiced agreement with the above plans.  All questions were answered to his satisfaction. he is encouraged to contact clinic should he have any questions or concerns prior to his return visit.     Follow up plan: - Return in about 3  months (around 08/19/2022) for Diabetes F/U with A1c in office, Bring meter and logs, Previsit labs.   Rayetta Pigg, [facility] [address] [place],  [address] Phone: [phone] Fax: [phone]  05/21/2022, 11:29 AM

## 2022-06-20 ENCOUNTER — Encounter: Payer: Self-pay | Admitting: *Deleted

## 2022-07-18 ENCOUNTER — Other Ambulatory Visit: Payer: Self-pay | Admitting: Cardiovascular Disease

## 2022-07-31 ENCOUNTER — Other Ambulatory Visit: Payer: Self-pay | Admitting: Nurse Practitioner

## 2022-08-05 ENCOUNTER — Other Ambulatory Visit: Payer: Self-pay | Admitting: *Deleted

## 2022-08-05 ENCOUNTER — Other Ambulatory Visit: Payer: Self-pay | Admitting: Nurse Practitioner

## 2022-08-05 DIAGNOSIS — E1165 Type 2 diabetes mellitus with hyperglycemia: Secondary | ICD-10-CM

## 2022-08-05 MED ORDER — ONETOUCH VERIO VI STRP: ORAL_STRIP | 5 refills | Status: DC

## 2022-08-05 NOTE — Telephone Encounter (Signed)
Patient called and left a message that a prescription be sent to the patient pharmacy for his Verio Test Strips.

## 2022-08-06 ENCOUNTER — Other Ambulatory Visit: Payer: Self-pay | Admitting: Nurse Practitioner

## 2022-08-12 ENCOUNTER — Other Ambulatory Visit (HOSPITAL_COMMUNITY): Payer: Self-pay

## 2022-08-12 ENCOUNTER — Ambulatory Visit: Payer: Medicare Other | Attending: Cardiovascular Disease | Admitting: Cardiovascular Disease

## 2022-08-12 ENCOUNTER — Encounter: Payer: Self-pay | Admitting: Cardiovascular Disease

## 2022-08-12 VITALS — BP 109/69 | HR 72 | Ht 67.0 in | Wt 249.0 lb

## 2022-08-12 DIAGNOSIS — I10 Essential (primary) hypertension: Secondary | ICD-10-CM | POA: Diagnosis not present

## 2022-08-12 DIAGNOSIS — E782 Mixed hyperlipidemia: Secondary | ICD-10-CM | POA: Diagnosis not present

## 2022-08-12 DIAGNOSIS — I25119 Atherosclerotic heart disease of native coronary artery with unspecified angina pectoris: Secondary | ICD-10-CM

## 2022-08-12 NOTE — Patient Instructions (Signed)
Medication Instructions:  Your physician recommends that you continue on your current medications as directed. Please refer to the Current Medication list given to you today.  *If you need a refill on your cardiac medications before your next appointment, please call your pharmacy*   Lab Work: none If you have labs (blood work) drawn today and your tests are completely normal, you will receive your results only by: MyChart Message (if you have MyChart) OR A paper copy in the mail If you have any lab test that is abnormal or we need to change your treatment, we will call you to review the results.   Testing/Procedures: none   Follow-Up: At [facility], you and your health needs are our priority.  As part of our continuing mission to provide you with exceptional heart care, we have created designated Provider Care Teams.  These Care Teams include your primary Cardiologist (physician) and Advanced Practice Providers (APPs -  Physician Assistants and Nurse Practitioners) who all work together to provide you with the care you need, when you need it.  We recommend signing up for the patient portal called "MyChart".  Sign up information is provided on this After Visit Summary.  MyChart is used to connect with patients for Virtual Visits (Telemedicine).  Patients are able to view lab/test results, encounter notes, upcoming appointments, etc.  Non-urgent messages can be sent to your provider as well.   To learn more about what you can do with MyChart, go to [email or web address].    Your next appointment:   12 month(s)  Provider:   Michael Cooper, MD     Other Instructions   

## 2022-08-12 NOTE — Progress Notes (Signed)
Cardiology Office Note:    Date:  08/12/2022   ID:  AJAN CUTRER, DOB 20-Jun-1955, MRN [id number]  PCP:  Tommie Sams, DO   [facility] Providers Cardiologist:  Tonny Bollman, MD     Referring MD: Tommie Sams, DO   Chief Complaint  Patient presents with   Coronary Artery Disease    History of Present Illness:    [name] is a 67 y.o. male presenting for follow-up evaluation.  The patient is here today.  His cardiovascular:  Coronary artery disease  s/p CABG in 2006 s/p NSTEMI >> DES to S-Dx and DES to [facility] in 2015 Cath 2019: S-Dx and S-RCA both occluded; PCI:  POBA to pLCx Hypertension  Hyperlipidemia  GERD  The patient is here alone today.  He is doing pretty well.  He has lost some weight with Ozempic.  He is walking 2 to 3 miles daily without exertional symptoms.  He does have some chest discomfort when he pushes himself hard, but otherwise reports no concerning symptoms.  No dyspnea, edema, orthopnea, PND, or heart palpitations.  Past Medical History:  Diagnosis Date   Arthritis    CAD (coronary artery disease) 2000   a. History of MI ~2000 s/p PCI. b. CABG in 2006 (LIMA-LAD, RIMA-acute marginal, SVG-DX, SVG-RCA). c. NSTEMI 05/2013:severe stenoses of the SVG-PDA and SVG-diagonal, both treated successfully with PCI (DES x2).   Diabetes (HCC)    GERD (gastroesophageal reflux disease)    Gout    History of hypertension    Hydrocele 2012   Hyperlipidemia    Hypertension    Myocardial infarction [facility]) 2014   x3   Obesity    S/P coronary angioplasty 11/10/17 11/11/2017    Past Surgical History:  Procedure Laterality Date   BIOPSY  09/17/2021   Procedure: BIOPSY;  Surgeon: Dolores Frame, MD;  [facility]: [facility];  Service: Gastroenterology;;   CATARACT EXTRACTION W/PHACO Left 05/17/2012   Procedure: CATARACT EXTRACTION PHACO AND INTRAOCULAR LENS PLACEMENT (IOC);  Surgeon: Gemma Payor, MD;  Location: [facility];  Service:  Ophthalmology;  Laterality: Left;  CDE:  0.76   CATARACT EXTRACTION W/PHACO Right 08/26/2012   Procedure: CATARACT EXTRACTION PHACO AND INTRAOCULAR LENS PLACEMENT (IOC);  Surgeon: Gemma Payor, MD;  Location: [facility];  Service: Ophthalmology;  Laterality: Right;  CDE: 1.13   COLONOSCOPY WITH PROPOFOL N/A 09/17/2021   Procedure: COLONOSCOPY WITH PROPOFOL;  Surgeon: Dolores Frame, MD;  [facility]: [facility];  Service: Gastroenterology;  Laterality: N/A;  730   CORONARY ANGIOPLASTY  2000   stents x2   CORONARY ARTERY BYPASS GRAFT  2006   4 vessels   Coronary artery bypass grafting  May 2006   Four-vessel   CORONARY BALLOON ANGIOPLASTY N/A 11/10/2017   Procedure: CORONARY BALLOON ANGIOPLASTY;  Surgeon: Tonny Bollman, MD;  Location: [facility] INVASIVE CV LAB;  Service: Cardiovascular;  Laterality: N/A;   FOOT FRACTURE SURGERY     right   HEMOSTASIS CLIP PLACEMENT  09/17/2021   Procedure: HEMOSTASIS CLIP PLACEMENT;  Surgeon: Dolores Frame, MD;  [facility]: [facility];  Service: Gastroenterology;;   LEFT HEART CATH AND CORS/GRAFTS ANGIOGRAPHY N/A 11/10/2017   Procedure: LEFT HEART CATH AND CORS/GRAFTS ANGIOGRAPHY;  Surgeon: Tonny Bollman, MD;  Location: [facility] INVASIVE CV LAB;  Service: Cardiovascular;  Laterality: N/A;   LEFT HEART CATHETERIZATION WITH CORONARY/GRAFT ANGIOGRAM N/A 05/05/2011   Procedure: LEFT HEART CATHETERIZATION WITH Isabel Caprice;  Surgeon: Wendall Stade, MD;  Location: [facility]  CATH LAB;  Service: Cardiovascular;  Laterality: N/A;   LEFT HEART CATHETERIZATION WITH CORONARY/GRAFT ANGIOGRAM N/A 06/06/2013   Procedure: LEFT HEART CATHETERIZATION WITH Isabel Caprice;  Surgeon: Micheline Chapman, MD;  Location: [facility] CATH LAB;  Service: Cardiovascular;  Laterality: N/A;   MASS EXCISION Right 05/14/2016   Procedure: EXCISION MASS RIGHT ELBOW;  Surgeon: Vickki Hearing, MD;  Location: [facility];  Service: Orthopedics;  Laterality: Right;   PILONIDAL CYST EXCISION      POLYPECTOMY  09/17/2021   Procedure: POLYPECTOMY;  Surgeon: Marguerita Merles, Reuel Boom, MD;  Location: [facility];  Service: Gastroenterology;;   TONSILLECTOMY      Current Medications: Current Meds  Medication Sig   Alirocumab (PRALUENT) 75 MG/ML SOAJ INJECT 1 ML UNDER THE SKIN EVERY 14 DAYS   aspirin EC 81 MG tablet Take 81 mg by mouth daily.   clopidogrel (PLAVIX) 75 MG tablet TAKE 1 TABLET BY MOUTH EVERY DAY   glucose blood (ONETOUCH VERIO) test strip Use to check BG daily as instructed   isosorbide mononitrate (IMDUR) 60 MG 24 hr tablet Take 1 tablet (60 mg total) by mouth daily.   Lancets (ONETOUCH DELICA PLUS LANCET33G) MISC Use to monitor glucose twice daily.   losartan (COZAAR) 25 MG tablet Take 1 tablet (25 mg total) by mouth daily.   metFORMIN (GLUCOPHAGE-XR) 500 MG 24 hr tablet Take 2 tablets (1,000 mg total) by mouth daily with breakfast.   metoprolol tartrate (LOPRESSOR) 25 MG tablet TAKE 1 TABLET BY MOUTH TWICE A DAY   nitroGLYCERIN (NITROSTAT) 0.4 MG SL tablet PLACE 1 TABLET UNDER THE TONGUE EVERY 5 MINUTES AS NEEDED FOR CHEST PAIN.   rosuvastatin (CRESTOR) 40 MG tablet TAKE 1 TABLET BY MOUTH EVERY DAY   Semaglutide, 1 MG/DOSE, (OZEMPIC, 1 MG/DOSE,) 4 MG/3ML SOPN INJECT 1MG  UNDER THE SKIN ONCE A WEEK AS DIRECTED     Allergies:   Hydrocodone   Social History   Socioeconomic History   Marital status: Divorced    Spouse name: Not on file   Number of children: Not on file   Years of education: Not on file   Highest education level: Not on file  Occupational History   Not on file  Tobacco Use   Smoking status: Never   Smokeless tobacco: Never  Vaping Use   Vaping Use: Never used  Substance and Sexual Activity   Alcohol use: No   Drug use: No   Sexual activity: Yes    Birth control/protection: None  Other Topics Concern   Not on file  Social History Narrative   Not on file   Social Determinants of Health   Financial Resource Strain: Low Risk   (01/08/2022)   Overall Financial Resource Strain (CARDIA)    Difficulty of Paying Living Expenses: Not hard at all  Food Insecurity: No Food Insecurity (01/08/2022)   Hunger Vital Sign    Worried About Running Out of Food in the Last Year: Never true    Ran Out of Food in the Last Year: Never true  Transportation Needs: No Transportation Needs (01/08/2022)   PRAPARE - Administrator, Civil Service (Medical): No    Lack of Transportation (Non-Medical): No  Physical Activity: Insufficiently Active (01/08/2022)   Exercise Vital Sign    Days of Exercise per Week: 2 days    Minutes of Exercise per Session: 20 min  Stress: No Stress Concern Present (01/08/2022)   [employer] of Occupational Health - Occupational Stress Questionnaire  Feeling of Stress : Not at all  Social Connections: Moderately Isolated (01/08/2022)   Social Connection and Isolation Panel [NHANES]    Frequency of Communication with Friends and Family: More than three times a week    Frequency of Social Gatherings with Friends and Family: Once a week    Attends Religious Services: More than 4 times per year    Active Member of [employer] or Organizations: No    Attends Banker Meetings: Never    Marital Status: Divorced     Family History: The patient's family history includes Cancer in his father and mother; Cervical cancer in his mother; Healthy in his daughter and son; Throat cancer in his father; Thyroid disease in his son.  ROS:   Please see the history of present illness.    All other systems reviewed and are negative.  EKGs/Labs/Other Studies Reviewed:    The following studies were reviewed today: Cardiac Studies & Procedures   CARDIAC CATHETERIZATION  CARDIAC CATHETERIZATION 11/10/2017  Narrative 1.  Severe native multivessel coronary artery disease with total occlusion of the RCA and total occlusion of the LAD, total occlusion of the first OM and severe stenosis of the proximal  circumflex 2.  Status post aortocoronary bypass surgery with continued patency of the LIMA to LAD into a diffusely diseased mid and distal LAD, continued patency of the free RIMA to OM, and total occlusion of the saphenous vein graft to diagonal and RCA branches 3.  Successful balloon angioplasty of the proximal circumflex, unable to deliver a stent, proximal circumflex stenosis reduced from 75% to 25% 4.  Normal LVEDP  Recommendations: Ongoing medical therapy and will increase antianginal program as tolerated.  Likely not a candidate for redo CABG because of poor conduits noted at initial surgery, prior use of both the RIMA and LIMA, and poor distal targets.  Recommend dual antiplatelet therapy with Aspirin 81mg  daily and Clopidogrel 75mg  daily long-term (beyond 12 months) because of extensive CAD, prior CABG, multiple PCI procedures.  Findings Coronary Findings Diagnostic  Dominance: Right  Left Anterior Descending Prox LAD lesion is 100% stenosed. Mid LAD lesion is 80% stenosed. Dist LAD lesion is 90% stenosed. small, diffusely diseased vessel through the mid/distal portions.  Left Circumflex Ost Cx to Prox Cx lesion is 75% stenosed. The lesion is eccentric.  First Obtuse Marginal Branch Ost 1st Mrg lesion is 100% stenosed.  Right Coronary Artery Mid RCA lesion is 100% stenosed.  First Right Posterolateral Branch Collaterals 1st RPL filled by collaterals from Dist Cx.  LIMA LIMA Graft To Mid LAD LIMA. Patent LIMA-LAD. Severe diffuse distal LAD stenosis.  Graft To Dist RCA SVG-RCA occluded in proximal graft within stented segment Origin to Prox Graft lesion is 100% stenosed. The lesion was previously treated using a drug eluting stent over 2 years ago.  Graft To Ost 1st Diag Origin to Prox Graft lesion is 100% stenosed. proximal body of SVG-diagonal 100% occluded Prox Graft to Mid Graft lesion is 100% stenosed. The lesion was previously treated using a drug eluting stent  over 2 years ago.  Sequential RIMA Graft To Ost LAD, Ost 1st Mrg RIMA. free RIMA-OM1 is widely patent  Intervention  Ost Cx to Prox Cx lesion Angioplasty CATH VISTA GUIDE 6FR XBLAD3.5 guide catheter was inserted. WIRE COUGAR XT STRL 190CM guidewire used to cross lesion. Balloon angioplasty was performed using a BALLOON SAPPHIRE Raymond 2.5X12. Maximum pressure: 14 atm. Heparin is used for anticoagulation.  Initially a 5 [place] EBU 3.5 cm  guide is used.  A cougar is advanced across the lesion.  There is steep angulation from the lesion into the mid circumflex and this made wiring the lesion difficult.  The lesion is predilated with a 2.5 x 15 mm balloon.  I attempted to stent the lesion with a 3.0 x 18 mm resolute DES but this would not cross the lesion because of acute angulation.  Guide catheter and wire position was lost.  This was all changed out for a 6 [place] XB LAD 3.5 cm guide catheter.  The lesion is really wired with a cougar wire.  The lesion still could not be crossed with a stent.  The lesion is then redilated with a 2.5 x 12 mm noncompliant balloon to 14 atm.  Even with that, the lesion could not be crossed with the stent in guiding wire position were again lost.  Final angiography demonstrated an acceptable balloon angioplasty result with 20 to 25% residual stenosis and TIMI-3 flow.  The patient is chest pain-free throughout.  His ACT was therapeutic throughout greater than 250 seconds. Post-Intervention Lesion Assessment The intervention was successful. Pre-interventional TIMI flow is 3. Post-intervention TIMI flow is 3. No complications occurred at this lesion. There is a 25% residual stenosis post intervention.   STRESS TESTS  MYOCARDIAL PERFUSION IMAGING 06/23/2019  Narrative  The left ventricular ejection fraction is normal (55-65%).  Nuclear stress EF: 60%.  There was no ST segment deviation noted during stress.  Findings consistent with ischemia.  This is an intermediate  risk study.  No prior study for comparison.  Ischemia in mid anterior wall noted in both supine and upright stress images.               EKG:  EKG is ordered today.  The ekg ordered today demonstrates NSr 72 bpm, first degree AV block, otherwise within normal limits  Recent Labs: 09/13/2021: BUN 13; Creatinine, Ser 0.87; Potassium 4.2; Sodium 138  Recent Lipid Panel    Component Value Date/Time   CHOL 115 06/20/2021 0919   TRIG 156 (H) 06/20/2021 0919   HDL 42 06/20/2021 0919   CHOLHDL 2.7 06/20/2021 0919   CHOLHDL 3.4 05/07/2015 0834   VLDL 16 05/07/2015 0834   LDLCALC 47 06/20/2021 0919     Risk Assessment/Calculations:                Physical Exam:    VS:  BP 109/69 (BP Location: Left Arm, Patient Position: Sitting, Cuff Size: Normal)   Pulse 72   Ht 5\' 7"  (1.702 m)   Wt 249 lb (112.9 kg)   BMI 39.00 kg/m     Wt Readings from Last 3 Encounters:  08/12/22 249 lb (112.9 kg)  05/21/22 256 lb 8 oz (116.3 kg)  03/21/22 250 lb (113.4 kg)     GEN:  Well nourished, well developed in no acute distress HEENT: Normal NECK: No JVD; No carotid bruits LYMPHATICS: No lymphadenopathy CARDIAC: RRR, no murmurs, rubs, gallops RESPIRATORY:  Clear to auscultation without rales, wheezing or rhonchi  ABDOMEN: Soft, non-tender, non-distended MUSCULOSKELETAL:  No edema; No deformity  SKIN: Warm and dry NEUROLOGIC:  Alert and oriented x 3 PSYCHIATRIC:  Normal affect   ASSESSMENT:    1. Coronary artery disease involving native coronary artery of native heart with angina pectoris (HCC)   2. Essential hypertension   3. Mixed hyperlipidemia   4. Morbid obesity (HCC)    PLAN:    In order of problems listed above:  The  patient is doing well with minimal angina at present.  States that he only has chest discomfort if he really pushes himself physically.  Otherwise he is having no problems.  He is walking about 2 to 3 miles per day.  He will continue on his current medical  regimen which includes DAPT with aspirin and clopidogrel, isosorbide and metoprolol for antianginal therapy, and intensive lipid-lowering therapy.  He will continue to work on lifestyle modification as he has been doing. Blood pressure is under optimal control on losartan and metoprolol. Lipids have been at goal.  Last lipids are excellent with a cholesterol 115, HDL 42, LDL 47.  He is treated with Praluent and rosuvastatin.  Praluent has become quite expensive for him.  I spoke with our Pharm.D. and we will investigate whether switching him to Repatha might be more cost effective. The patient has done a good job with weight loss measures.  He will continue to work on this.  We discussed specific recommendations today.           Medication Adjustments/Labs and Tests Ordered: Current medicines are reviewed at length with the patient today.  Concerns regarding medicines are outlined above.  Orders Placed This Encounter  Procedures   EKG 12-Lead   No orders of the defined types were placed in this encounter.   Patient Instructions  Medication Instructions:  Your physician recommends that you continue on your current medications as directed. Please refer to the Current Medication list given to you today.  *If you need a refill on your cardiac medications before your next appointment, please call your pharmacy*   Lab Work: none If you have labs (blood work) drawn today and your tests are completely normal, you will receive your results only by: MyChart Message (if you have MyChart) OR A paper copy in the mail If you have any lab test that is abnormal or we need to change your treatment, we will call you to review the results.   Testing/Procedures: none   Follow-Up: At [facility], you and your health needs are our priority.  As part of our continuing mission to provide you with exceptional heart care, we have created designated Provider Care Teams.  These Care Teams  include your primary Cardiologist (physician) and Advanced Practice Providers (APPs -  Physician Assistants and Nurse Practitioners) who all work together to provide you with the care you need, when you need it.  We recommend signing up for the patient portal called "MyChart".  Sign up information is provided on this After Visit Summary.  MyChart is used to connect with patients for Virtual Visits (Telemedicine).  Patients are able to view lab/test results, encounter notes, upcoming appointments, etc.  Non-urgent messages can be sent to your provider as well.   To learn more about what you can do with MyChart, go to [email or web address].    Your next appointment:   12 month(s)  Provider:   Tonny Bollman, MD     Other Instructions      Signed, Tonny Bollman, MD  08/12/2022 5:39 PM    [facility]

## 2022-08-14 DIAGNOSIS — E1165 Type 2 diabetes mellitus with hyperglycemia: Secondary | ICD-10-CM | POA: Diagnosis not present

## 2022-08-14 DIAGNOSIS — E559 Vitamin D deficiency, unspecified: Secondary | ICD-10-CM | POA: Diagnosis not present

## 2022-08-15 LAB — COMPREHENSIVE METABOLIC PANEL WITH GFR
ALT: 18 [IU]/L (ref 0–44)
AST: 23 [IU]/L (ref 0–40)
Albumin/Globulin Ratio: 2 (ref 1.2–2.2)
Albumin: 4.5 g/dL (ref 3.9–4.9)
Alkaline Phosphatase: 80 [IU]/L (ref 44–121)
BUN/Creatinine Ratio: 14 (ref 10–24)
BUN: 12 mg/dL (ref 8–27)
Bilirubin Total: 1 mg/dL (ref 0.0–1.2)
CO2: 23 mmol/L (ref 20–29)
Calcium: 9.4 mg/dL (ref 8.6–10.2)
Chloride: 102 mmol/L (ref 96–106)
Creatinine, Ser: 0.87 mg/dL (ref 0.76–1.27)
Globulin, Total: 2.2 g/dL (ref 1.5–4.5)
Glucose: 123 mg/dL — ABNORMAL HIGH (ref 70–99)
Potassium: 4.8 mmol/L (ref 3.5–5.2)
Sodium: 141 mmol/L (ref 134–144)
Total Protein: 6.7 g/dL (ref 6.0–8.5)
eGFR: 95 mL/min/{1.73_m2}

## 2022-08-15 LAB — LIPID PANEL
Chol/HDL Ratio: 1.8 ratio (ref 0.0–5.0)
Cholesterol, Total: 81 mg/dL — ABNORMAL LOW (ref 100–199)
HDL: 45 mg/dL
LDL Chol Calc (NIH): 15 mg/dL (ref 0–99)
Triglycerides: 117 mg/dL (ref 0–149)
VLDL Cholesterol Cal: 21 mg/dL (ref 5–40)

## 2022-08-15 LAB — TSH: TSH: 2.83 u[IU]/mL (ref 0.450–4.500)

## 2022-08-15 LAB — VITAMIN D 25 HYDROXY (VIT D DEFICIENCY, FRACTURES): Vit D, 25-Hydroxy: 40.7 ng/mL (ref 30.0–100.0)

## 2022-08-15 LAB — T4, FREE: Free T4: 1.29 ng/dL (ref 0.82–1.77)

## 2022-08-20 ENCOUNTER — Ambulatory Visit: Payer: Medicare Other | Admitting: Physician Assistant

## 2022-08-21 ENCOUNTER — Ambulatory Visit (INDEPENDENT_AMBULATORY_CARE_PROVIDER_SITE_OTHER): Payer: Medicare Other | Admitting: Nurse Practitioner

## 2022-08-21 ENCOUNTER — Encounter: Payer: Self-pay | Admitting: Nurse Practitioner

## 2022-08-21 ENCOUNTER — Telehealth: Payer: Self-pay | Admitting: *Deleted

## 2022-08-21 VITALS — BP 130/74 | Ht 67.0 in | Wt 248.0 lb

## 2022-08-21 DIAGNOSIS — E559 Vitamin D deficiency, unspecified: Secondary | ICD-10-CM | POA: Diagnosis not present

## 2022-08-21 DIAGNOSIS — I10 Essential (primary) hypertension: Secondary | ICD-10-CM | POA: Diagnosis not present

## 2022-08-21 DIAGNOSIS — Z7985 Long-term (current) use of injectable non-insulin antidiabetic drugs: Secondary | ICD-10-CM

## 2022-08-21 DIAGNOSIS — E782 Mixed hyperlipidemia: Secondary | ICD-10-CM | POA: Diagnosis not present

## 2022-08-21 DIAGNOSIS — Z7984 Long term (current) use of oral hypoglycemic drugs: Secondary | ICD-10-CM | POA: Diagnosis not present

## 2022-08-21 DIAGNOSIS — E1165 Type 2 diabetes mellitus with hyperglycemia: Secondary | ICD-10-CM

## 2022-08-21 LAB — POCT GLYCOSYLATED HEMOGLOBIN (HGB A1C): Hemoglobin A1C: 6.3 % — AB (ref 4.0–5.6)

## 2022-08-21 MED ORDER — OZEMPIC (1 MG/DOSE) 4 MG/3ML ~~LOC~~ SOPN: 1.0000 mg | PEN_INJECTOR | SUBCUTANEOUS | 3 refills | Status: DC

## 2022-08-21 MED ORDER — ONETOUCH DELICA LANCING DEV MISC: 1.0000 | Freq: Once | 0 refills | Status: AC

## 2022-08-21 NOTE — Telephone Encounter (Signed)
He has receivied notification from [facility] that some of the medication(s) and possibly the lancets are not covered by [employer].  In his message he says that he thought they were covered before. He is asking if [name] may know anything or saw any alteratives come up when sending in the prescriptions.  I called the patient , let him know that I have sent her a note. Also, suggested that he contact his pharmacy and ask them if they saw any alternatives and maybe even talk with his insurance. He states that he is going to.

## 2022-08-21 NOTE — Progress Notes (Signed)
Endocrinology Follow Up Note       08/21/2022, 10:49 AM   Subjective:    Patient ID: [name], male    DOB: 10-Dec-1955.  [name] is being seen in follow up after being seen in consultation for management of currently uncontrolled symptomatic diabetes requested by  Tommie Sams, DO.   Past Medical History:  Diagnosis Date   Arthritis    CAD (coronary artery disease) 2000   a. History of MI ~2000 s/p PCI. b. CABG in 2006 (LIMA-LAD, RIMA-acute marginal, SVG-DX, SVG-RCA). c. NSTEMI 05/2013:severe stenoses of the SVG-PDA and SVG-diagonal, both treated successfully with PCI (DES x2).   Diabetes (HCC)    GERD (gastroesophageal reflux disease)    Gout    History of hypertension    Hydrocele 2012   Hyperlipidemia    Hypertension    Myocardial infarction [facility]) 2014   x3   Obesity    S/P coronary angioplasty 11/10/17 11/11/2017    Past Surgical History:  Procedure Laterality Date   BIOPSY  09/17/2021   Procedure: BIOPSY;  Surgeon: Dolores Frame, MD;  Location: [facility];  Service: Gastroenterology;;   CATARACT EXTRACTION W/PHACO Left 05/17/2012   Procedure: CATARACT EXTRACTION PHACO AND INTRAOCULAR LENS PLACEMENT (IOC);  Surgeon: Gemma Payor, MD;  Location: [facility];  Service: Ophthalmology;  Laterality: Left;  CDE:  0.76   CATARACT EXTRACTION W/PHACO Right 08/26/2012   Procedure: CATARACT EXTRACTION PHACO AND INTRAOCULAR LENS PLACEMENT (IOC);  Surgeon: Gemma Payor, MD;  Location: [facility];  Service: Ophthalmology;  Laterality: Right;  CDE: 1.13   COLONOSCOPY WITH PROPOFOL N/A 09/17/2021   Procedure: COLONOSCOPY WITH PROPOFOL;  Surgeon: Dolores Frame, MD;  Location: [facility];  Service: Gastroenterology;  Laterality: N/A;  730   CORONARY ANGIOPLASTY  2000   stents x2   CORONARY ARTERY BYPASS GRAFT  2006   4 vessels   Coronary artery bypass grafting  May 2006   Four-vessel    CORONARY BALLOON ANGIOPLASTY N/A 11/10/2017   Procedure: CORONARY BALLOON ANGIOPLASTY;  Surgeon: Tonny Bollman, MD;  Location: [facility] INVASIVE CV LAB;  Service: Cardiovascular;  Laterality: N/A;   FOOT FRACTURE SURGERY     right   HEMOSTASIS CLIP PLACEMENT  09/17/2021   Procedure: HEMOSTASIS CLIP PLACEMENT;  Surgeon: Dolores Frame, MD;  [facility];  Service: Gastroenterology;;   LEFT HEART CATH AND CORS/GRAFTS ANGIOGRAPHY N/A 11/10/2017   Procedure: LEFT HEART CATH AND CORS/GRAFTS ANGIOGRAPHY;  Surgeon: Tonny Bollman, MD;  Location: [facility] INVASIVE CV LAB;  Service: Cardiovascular;  Laterality: N/A;   LEFT HEART CATHETERIZATION WITH CORONARY/GRAFT ANGIOGRAM N/A 05/05/2011   Procedure: LEFT HEART CATHETERIZATION WITH Isabel Caprice;  Surgeon: Wendall Stade, MD;  Location: [facility] CATH LAB;  Service: Cardiovascular;  Laterality: N/A;   LEFT HEART CATHETERIZATION WITH CORONARY/GRAFT ANGIOGRAM N/A 06/06/2013   Procedure: LEFT HEART CATHETERIZATION WITH Isabel Caprice;  Surgeon: Micheline Chapman, MD;  Location: [facility] CATH LAB;  Service: Cardiovascular;  Laterality: N/A;   MASS EXCISION Right 05/14/2016   Procedure: EXCISION MASS RIGHT ELBOW;  Surgeon: Vickki Hearing, MD;  Location: [facility];  Service: Orthopedics;  Laterality: Right;   PILONIDAL CYST  EXCISION     POLYPECTOMY  09/17/2021   Procedure: POLYPECTOMY;  Surgeon: Marguerita Merles, Reuel Boom, MD;  Location: [facility];  Service: Gastroenterology;;   TONSILLECTOMY      Social History   Socioeconomic History   Marital status: Divorced    Spouse name: Not on file   Number of children: Not on file   Years of education: Not on file   Highest education level: Not on file  Occupational History   Not on file  Tobacco Use   Smoking status: Never   Smokeless tobacco: Never  Vaping Use   Vaping Use: Never used  Substance and Sexual Activity   Alcohol use: No   Drug use: No   Sexual activity: Yes    Birth  control/protection: None  Other Topics Concern   Not on file  Social History Narrative   Not on file   Social Determinants of Health   Financial Resource Strain: Low Risk  (01/08/2022)   Overall Financial Resource Strain (CARDIA)    Difficulty of Paying Living Expenses: Not hard at all  Food Insecurity: No Food Insecurity (01/08/2022)   Hunger Vital Sign    Worried About Running Out of Food in the Last Year: Never true    Ran Out of Food in the Last Year: Never true  Transportation Needs: No Transportation Needs (01/08/2022)   PRAPARE - Administrator, Civil Service (Medical): No    Lack of Transportation (Non-Medical): No  Physical Activity: Insufficiently Active (01/08/2022)   Exercise Vital Sign    Days of Exercise per Week: 2 days    Minutes of Exercise per Session: 20 min  Stress: No Stress Concern Present (01/08/2022)   [employer] of Occupational Health - Occupational Stress Questionnaire    Feeling of Stress : Not at all  Social Connections: Moderately Isolated (01/08/2022)   Social Connection and Isolation Panel [NHANES]    Frequency of Communication with Friends and Family: More than three times a week    Frequency of Social Gatherings with Friends and Family: Once a week    Attends Religious Services: More than 4 times per year    Active Member of [employer] or Organizations: No    Attends Banker Meetings: Never    Marital Status: Divorced    Family History  Problem Relation Age of Onset   Cancer Mother    Cervical cancer Mother    Cancer Father    Throat cancer Father    Thyroid disease Son    Healthy Daughter    Healthy Son     Outpatient Encounter Medications as of 08/21/2022  Medication Sig   Alirocumab (PRALUENT) 75 MG/ML SOAJ INJECT 1 ML UNDER THE SKIN EVERY 14 DAYS   aspirin EC 81 MG tablet Take 81 mg by mouth daily.   clopidogrel (PLAVIX) 75 MG tablet TAKE 1 TABLET BY MOUTH EVERY DAY   glucose blood (ONETOUCH VERIO) test  strip Use to check BG daily as instructed   isosorbide mononitrate (IMDUR) 60 MG 24 hr tablet Take 1 tablet (60 mg total) by mouth daily.   Lancet Devices (ONE TOUCH DELICA LANCING DEV) MISC 1 Device by Does not apply route once for 1 dose.   Lancets (ONETOUCH DELICA PLUS LANCET33G) MISC Use to monitor glucose twice daily.   losartan (COZAAR) 25 MG tablet Take 1 tablet (25 mg total) by mouth daily.   metFORMIN (GLUCOPHAGE-XR) 500 MG 24 hr tablet Take 2 tablets (1,000 mg total) by  mouth daily with breakfast.   metoprolol tartrate (LOPRESSOR) 25 MG tablet TAKE 1 TABLET BY MOUTH TWICE A DAY   nitroGLYCERIN (NITROSTAT) 0.4 MG SL tablet PLACE 1 TABLET UNDER THE TONGUE EVERY 5 MINUTES AS NEEDED FOR CHEST PAIN.   rosuvastatin (CRESTOR) 40 MG tablet TAKE 1 TABLET BY MOUTH EVERY DAY   [DISCONTINUED] Semaglutide, 1 MG/DOSE, (OZEMPIC, 1 MG/DOSE,) 4 MG/3ML SOPN INJECT 1MG  UNDER THE SKIN ONCE A WEEK AS DIRECTED   Semaglutide, 1 MG/DOSE, (OZEMPIC, 1 MG/DOSE,) 4 MG/3ML SOPN Inject 1 mg into the skin once a week.   No facility-administered encounter medications on file as of 08/21/2022.    ALLERGIES: Allergies  Allergen Reactions   Hydrocodone Nausea Only    VACCINATION STATUS: Immunization History  Administered Date(s) Administered   Fluad Quad(high Dose 65+) 03/23/2020   Influenza, High Dose Seasonal PF 03/12/2021   Influenza, Quadrivalent, Recombinant, Inj, Pf 01/25/2019   Influenza,trivalent, recombinat, inj, PF 01/09/2017   Influenza-Unspecified 01/22/2018   Moderna Sars-Covid-2 Vaccination 05/20/2019, 06/21/2019, 03/23/2020   PNEUMOCOCCAL CONJUGATE-20 06/20/2021   Tdap 03/24/2014   Zoster Recombinat (Shingrix) 10/16/2018, 01/25/2019    Diabetes He presents for his follow-up diabetic visit. He has type 2 diabetes mellitus. Onset time: Recently diagnosed at age 8. His disease course has been stable. There are no hypoglycemic associated symptoms. Associated symptoms include blurred vision and  fatigue. Pertinent negatives for diabetes include no polydipsia, no polyuria and no weight loss. There are no hypoglycemic complications. Symptoms are improving. Diabetic complications include heart disease and nephropathy. Risk factors for coronary artery disease include diabetes mellitus, dyslipidemia, family history, obesity, male sex, hypertension and sedentary lifestyle. Current diabetic treatment includes oral agent (monotherapy) (and Ozempic). He is compliant with treatment most of the time. His weight is decreasing steadily. He is following a generally healthy diet. When asked about meal planning, he reported none. He has not had a previous visit with a dietitian. He never participates in exercise. His home blood glucose trend is decreasing steadily. His breakfast blood glucose range is generally 110-130 mg/dl. (He presents today with his meter and logs showing stable, at goal fasting glycemic profile.  His POCT A1c today is 6.3%, improving from last visit of 6.9%.  He denies any hypoglycemia.  He does note that he had trouble getting his Ozempic due to drug shortage for a period of time but is now back on it.) An ACE inhibitor/angiotensin II receptor blocker is not being taken. He sees a podiatrist.Eye exam is current.  Hypertension This is a chronic problem. The current episode started more than 1 year ago. The problem has been resolved since onset. The problem is controlled. Associated symptoms include blurred vision. There are no associated agents to hypertension. Risk factors for coronary artery disease include diabetes mellitus, dyslipidemia, family history, obesity and male gender. Past treatments include beta blockers. The current treatment provides mild improvement. Compliance problems include diet and exercise.  Hypertensive end-organ damage includes kidney disease and CAD/MI. Identifiable causes of hypertension include chronic renal disease.  Hyperlipidemia This is a chronic problem. The  current episode started more than 1 year ago. The problem is controlled. Recent lipid tests were reviewed and are normal. Exacerbating diseases include chronic renal disease and diabetes. Factors aggravating his hyperlipidemia include fatty foods and beta blockers. Current antihyperlipidemic treatment includes statins. The current treatment provides moderate improvement of lipids. Compliance problems include adherence to diet and adherence to exercise.  Risk factors for coronary artery disease include diabetes mellitus, dyslipidemia, family history,  obesity, male sex and hypertension.     Review of systems  Constitutional: + decreasing body weight,  current Body mass index is 38.84 kg/m. , no fatigue, no subjective hyperthermia, no subjective hypothermia Eyes: no blurry vision, no xerophthalmia ENT: no sore throat, no nodules palpated in throat, no dysphagia/odynophagia, no hoarseness Cardiovascular: no chest pain, no shortness of breath, no palpitations, no leg swelling Respiratory: no cough, no shortness of breath Gastrointestinal: no nausea/vomiting/diarrhea, + alternating constipation/diarrhea Musculoskeletal: no muscle/joint aches Skin: no rashes, no hyperemia Neurological: no tremors, no numbness, no tingling, no dizziness Psychiatric: no depression, no anxiety  Objective:     BP 130/74 (BP Location: Left Arm, Patient Position: Sitting, Cuff Size: Large)   Ht 5\' 7"  (1.702 m)   Wt 248 lb (112.5 kg)   BMI 38.84 kg/m   Wt Readings from Last 3 Encounters:  08/21/22 248 lb (112.5 kg)  08/12/22 249 lb (112.9 kg)  05/21/22 256 lb 8 oz (116.3 kg)     BP Readings from Last 3 Encounters:  08/21/22 130/74  08/12/22 109/69  05/21/22 126/73     Physical Exam- Limited  Constitutional:  Body mass index is 38.84 kg/m. , not in acute distress, normal state of mind Eyes:  EOMI, no exophthalmos Neck: Supple Musculoskeletal: no gross deformities, strength intact in all four extremities,  no gross restriction of joint movements Skin:  no rashes, no hyperemia Neurological: no tremor with outstretched hands   Diabetic Foot Exam - Simple   No data filed    CMP ( most recent) CMP     Component Value Date/Time   NA 141 08/14/2022 0813   K 4.8 08/14/2022 0813   CL 102 08/14/2022 0813   CO2 23 08/14/2022 0813   GLUCOSE 123 (H) 08/14/2022 0813   GLUCOSE 140 (H) 09/13/2021 0919   BUN 12 08/14/2022 0813   CREATININE 0.87 08/14/2022 0813   CREATININE 1.00 05/07/2015 0834   CALCIUM 9.4 08/14/2022 0813   PROT 6.7 08/14/2022 0813   ALBUMIN 4.5 08/14/2022 0813   AST 23 08/14/2022 0813   ALT 18 08/14/2022 0813   ALKPHOS 80 08/14/2022 0813   BILITOT 1.0 08/14/2022 0813   GFRNONAA >60 09/13/2021 0919   GFRAA 88 11/16/2018 1027     Diabetic Labs (most recent): Lab Results  Component Value Date   HGBA1C 6.3 (A) 08/21/2022   HGBA1C 6.9 (H) 03/21/2022   HGBA1C 7.0 (H) 12/20/2021   MICROALBUR 80 mg/L 05/21/2022     Lipid Panel ( most recent) Lipid Panel     Component Value Date/Time   CHOL 81 (L) 08/14/2022 0813   TRIG 117 08/14/2022 0813   HDL 45 08/14/2022 0813   CHOLHDL 1.8 08/14/2022 0813   CHOLHDL 3.4 05/07/2015 0834   VLDL 16 05/07/2015 0834   LDLCALC 15 08/14/2022 0813   LABVLDL 21 08/14/2022 0813      Lab Results  Component Value Date   TSH 2.830 08/14/2022   TSH 2.270 06/27/2020   TSH 1.478 05/03/2011   FREET4 1.29 08/14/2022           Assessment & Plan:   1) Type 2 diabetes mellitus with hyperglycemia, without long-term current use of insulin (HCC)  He presents today with his meter and logs showing stable, at goal fasting glycemic profile.  His POCT A1c today is 6.3%, improving from last visit of 6.9%.  He denies any hypoglycemia.  He does note that he had trouble getting his Ozempic due to drug shortage for  a period of time but is now back on it.  - [name] has currently uncontrolled symptomatic type 2 DM since 67 years of age.    -Recent labs reviewed.  - I had a long discussion with him about the progressive nature of diabetes and the pathology behind its complications. -his diabetes is complicated by CAD with MI and mild CKD and he remains at a high risk for more acute and chronic complications which include CAD, CVA, CKD, retinopathy, and neuropathy. These are all discussed in detail with him.  The following Lifestyle Medicine recommendations according to American College of Lifestyle Medicine [facility]) were discussed and offered to patient and he agrees to start the journey:  A. Whole Foods, Plant-based plate comprising of fruits and vegetables, plant-based proteins, whole-grain carbohydrates was discussed in detail with the patient.   A list for source of those nutrients were also provided to the patient.  Patient will use only water or unsweetened tea for hydration. B.  The need to stay away from risky substances including alcohol, smoking; obtaining 7 to 9 hours of restorative sleep, at least 150 minutes of moderate intensity exercise weekly, the importance of healthy social connections,  and stress reduction techniques were discussed. C.  A full color page of  Calorie density of various food groups per pound showing examples of each food groups was provided to the patient.  - Nutritional counseling repeated at each appointment due to patients tendency to fall back in to old habits.  - The patient admits there is a room for improvement in their diet and drink choices. -  Suggestion is made for the patient to avoid simple carbohydrates from their diet including Cakes, Sweet Desserts / Pastries, Ice Cream, Soda (diet and regular), Sweet Tea, Candies, Chips, Cookies, Sweet Pastries, Store Bought Juices, Alcohol in Excess of 1-2 drinks a day, Artificial Sweeteners, Coffee Creamer, and "Sugar-free" Products. This will help patient to have stable blood glucose profile and potentially avoid unintended weight gain.   - I  encouraged the patient to switch to unprocessed or minimally processed complex starch and increased protein intake (animal or plant source), fruits, and vegetables.   - Patient is advised to stick to a routine mealtimes to eat 3 meals a day and avoid unnecessary snacks (to snack only to correct hypoglycemia).  - I have approached him with the following individualized plan to manage his diabetes and patient agrees:   - He is advised to continue Metformin 1000 mg ER daily with breakfast and Ozempic 1 mg SQ weekly.  Says the copay for this is over $200 per month (thinks he is in the donut hole).  I did give him PAP forms to fill out.  If he does not qualify for that, he may need to ask insurance for a tier exception to get his copay to more affordable range.  Given his history of MI x 3, he definitely needs GLP1 to prevent another occurrence.   -he is encouraged to continue monitoring blood glucose daily, before breakfast, and to call the clinic if he has readings less than 70 or above 300 for 3 tests in a row.  He is on safe regimen and doesn't need to monitor glucose every day but prefers to continue to prevent losing control.  - Adjustment parameters are given to him for hypo and hyperglycemia in writing.  - Specific targets for  A1c; LDL, HDL, and Triglycerides were discussed with the patient.  2) Blood Pressure /Hypertension:  his blood pressure is controlled to target.   he is advised to continue his current medications including Metoprolol 25 mg p.o. twice daily.  3) Lipids/Hyperlipidemia:    Review of his recent lipid panel from 08/14/22 showed controlled LDL at 15 .  he is advised to continue Crestor 40 mg daily at bedtime.  Side effects and precautions discussed with him.  4)  Weight/Diet:  his Body mass index is 38.84 kg/m.  -  clearly complicating his diabetes care.   he is a candidate for weight loss. I discussed with him the fact that loss of 5 - 10% of his  current body weight will  have the most impact on his diabetes management.  Exercise, and detailed carbohydrates information provided  -  detailed on discharge instructions.  5) Chronic Care/Health Maintenance: -he is not on ACEI/ARB and is on Statin medications and is encouraged to initiate and continue to follow up with Ophthalmology, Dentist, Podiatrist at least yearly or according to recommendations, and advised to stay away from smoking. I have recommended yearly flu vaccine and pneumonia vaccine at least every 5 years; moderate intensity exercise for up to 150 minutes weekly; and sleep for at least 7 hours a day.  - he is advised to maintain close follow up with Tommie Sams, DO for primary care needs, as well as his other providers for optimal and coordinated care.      I spent  35  minutes in the care of the patient today including review of labs from CMP, Lipids, Thyroid Function, Hematology (current and previous including abstractions from other facilities); face-to-face time discussing  his blood glucose readings/logs, discussing hypoglycemia and hyperglycemia episodes and symptoms, medications doses, his options of short and long term treatment based on the latest standards of care / guidelines;  discussion about incorporating lifestyle medicine;  and documenting the encounter. Risk reduction counseling performed per USPSTF guidelines to reduce obesity and cardiovascular risk factors.     Please refer to Patient Instructions for Blood Glucose Monitoring and Insulin/Medications Dosing Guide"  in media tab for additional information. Please  also refer to " Patient Self Inventory" in the Media  tab for reviewed elements of pertinent patient history.  Andres Shad participated in the discussions, expressed understanding, and voiced agreement with the above plans.  All questions were answered to his satisfaction. he is encouraged to contact clinic should he have any questions or concerns prior to his return  visit.     Follow up plan: - Return in about 4 months (around 12/22/2022) for Diabetes F/U with A1c in office, No previsit labs.   Ronny Bacon, [facility] [address] [place], [place] [address] Phone: [phone] Fax: [phone]  08/21/2022, 10:49 AM

## 2022-09-03 ENCOUNTER — Other Ambulatory Visit: Payer: Self-pay | Admitting: Cardiovascular Disease

## 2022-09-04 ENCOUNTER — Telehealth: Payer: Self-pay | Admitting: Nurse Practitioner

## 2022-09-04 DIAGNOSIS — E1165 Type 2 diabetes mellitus with hyperglycemia: Secondary | ICD-10-CM

## 2022-09-04 NOTE — Telephone Encounter (Signed)
Pt stated ins would not cover new Lancing device.  Pt requested to send in a prescription and see if ins may just need a PA for it.

## 2022-09-05 MED ORDER — BLOOD GLUCOSE MONITOR KIT: 1.0000 | PACK | Freq: Every day | 0 refills | Status: DC

## 2022-09-05 NOTE — Telephone Encounter (Signed)
Rx for glucose monitor and supplies sent to [facility].

## 2022-09-25 ENCOUNTER — Other Ambulatory Visit: Payer: Self-pay | Admitting: Cardiovascular Disease

## 2022-09-29 ENCOUNTER — Ambulatory Visit: Payer: Medicare Other | Admitting: Family Medicine

## 2022-10-01 ENCOUNTER — Other Ambulatory Visit: Payer: Self-pay | Admitting: Cardiovascular Disease

## 2022-10-01 DIAGNOSIS — E782 Mixed hyperlipidemia: Secondary | ICD-10-CM

## 2022-10-01 DIAGNOSIS — I214 Non-ST elevation (NSTEMI) myocardial infarction: Secondary | ICD-10-CM

## 2022-10-01 DIAGNOSIS — I25119 Atherosclerotic heart disease of native coronary artery with unspecified angina pectoris: Secondary | ICD-10-CM

## 2022-10-06 ENCOUNTER — Ambulatory Visit (INDEPENDENT_AMBULATORY_CARE_PROVIDER_SITE_OTHER): Payer: Medicare Other | Admitting: Family Medicine

## 2022-10-06 VITALS — BP 142/86 | HR 60 | Temp 97.7°F | Ht 67.0 in | Wt 251.0 lb

## 2022-10-06 DIAGNOSIS — I25119 Atherosclerotic heart disease of native coronary artery with unspecified angina pectoris: Secondary | ICD-10-CM | POA: Diagnosis not present

## 2022-10-06 DIAGNOSIS — E1165 Type 2 diabetes mellitus with hyperglycemia: Secondary | ICD-10-CM | POA: Diagnosis not present

## 2022-10-06 DIAGNOSIS — I10 Essential (primary) hypertension: Secondary | ICD-10-CM | POA: Diagnosis not present

## 2022-10-06 DIAGNOSIS — E782 Mixed hyperlipidemia: Secondary | ICD-10-CM | POA: Diagnosis not present

## 2022-10-06 MED ORDER — SEMAGLUTIDE (2 MG/DOSE) 8 MG/3ML ~~LOC~~ SOPN: 2.0000 mg | PEN_INJECTOR | SUBCUTANEOUS | 3 refills | Status: DC

## 2022-10-06 NOTE — Progress Notes (Signed)
Subjective:  Patient ID: [name], male    DOB: 15-Dec-1955  Age: 67 y.o. MRN: [id number]  CC: Chief Complaint  Patient presents with   Diabetes   Follow-up    Pt fell on some steps on 7/06 and has a knot on his shin and bruising on foot (right side)    HPI:  67 year old male with coronary disease, hypertension, fatty liver, GERD, type 2 diabetes, obesity, hyperlipidemia presents for follow-up.  Patient states that overall he is doing well.  Most recent A1c 6.3.  He is on metformin and Ozempic.  He would like to discuss further increase in Ozempic today.  In regards to his heart disease, he is feeling well.  No chest pain or shortness of breath.  He is trying to stay physically active.  BP mildly elevated here today.  He is on losartan 25 mg daily and metoprolol 25 mg twice daily.  Lipids are very well-controlled on Praluent and Crestor.  Patient reports that he recently hit his shin and now has a knot on the area.  He is also had some bruising of the ankle and foot.  He would like me to examine this today.  Patient Active Problem List   Diagnosis Date Noted   Mixed hyperlipidemia 06/20/2021   History of gout 06/20/2021   Type 2 diabetes mellitus with hyperglycemia (HCC) 06/20/2021   Coronary artery disease involving native coronary artery with angina pectoris (HCC)    Obesity    GERD (gastroesophageal reflux disease) 07/12/2012   Fatty liver 07/07/2011   Essential hypertension 08/22/2008    Social Hx   Social History   Socioeconomic History   Marital status: Divorced    Spouse name: Not on file   Number of children: Not on file   Years of education: Not on file   Highest education level: Not on file  Occupational History   Not on file  Tobacco Use   Smoking status: Never   Smokeless tobacco: Never  Vaping Use   Vaping status: Never Used  Substance and Sexual Activity   Alcohol use: No   Drug use: No   Sexual activity: Yes    Birth control/protection:  None  Other Topics Concern   Not on file  Social History Narrative   Not on file   Social Determinants of Health   Financial Resource Strain: Low Risk  (01/08/2022)   Overall Financial Resource Strain (CARDIA)    Difficulty of Paying Living Expenses: Not hard at all  Food Insecurity: No Food Insecurity (01/08/2022)   Hunger Vital Sign    Worried About Running Out of Food in the Last Year: Never true    Ran Out of Food in the Last Year: Never true  Transportation Needs: No Transportation Needs (01/08/2022)   PRAPARE - Administrator, Civil Service (Medical): No    Lack of Transportation (Non-Medical): No  Physical Activity: Insufficiently Active (01/08/2022)   Exercise Vital Sign    Days of Exercise per Week: 2 days    Minutes of Exercise per Session: 20 min  Stress: No Stress Concern Present (01/08/2022)   [employer] of Occupational Health - Occupational Stress Questionnaire    Feeling of Stress : Not at all  Social Connections: Moderately Isolated (01/08/2022)   Social Connection and Isolation Panel [NHANES]    Frequency of Communication with Friends and Family: More than three times a week    Frequency of Social Gatherings with Friends and Family: Once a  week    Attends Religious Services: More than 4 times per year    Active Member of Clubs or Organizations: No    Attends Banker Meetings: Never    Marital Status: Divorced    Review of Systems Per HPI  Objective:  BP (!) 142/86   Pulse 60   Temp 97.7 F (36.5 C)   Ht 5\' 7"  (1.702 m)   Wt 251 lb (113.9 kg)   SpO2 99%   BMI 39.31 kg/m      10/06/2022   10:18 AM 08/21/2022   10:22 AM 08/12/2022    1:45 PM  BP/Weight  Systolic BP 142 130 109  Diastolic BP 86 74 69  Wt. (Lbs) 251 248 249  BMI 39.31 kg/m2 38.84 kg/m2 39 kg/m2    Physical Exam Vitals and nursing note reviewed.  Constitutional:      General: He is not in acute distress.    Appearance: Normal appearance. He is  obese.  HENT:     Head: Normocephalic and atraumatic.  Eyes:     General:        Right eye: No discharge.        Left eye: No discharge.     Conjunctiva/sclera: Conjunctivae normal.  Cardiovascular:     Rate and Rhythm: Normal rate and regular rhythm.  Pulmonary:     Effort: Pulmonary effort is normal.     Breath sounds: Normal breath sounds. No wheezing, rhonchi or rales.  Musculoskeletal:     Comments: Palpable firm nodule of the right distal anterior tibia.  Neurological:     Mental Status: He is alert.  Psychiatric:        Mood and Affect: Mood normal.        Behavior: Behavior normal.     Lab Results  Component Value Date   WBC 7.4 06/20/2021   HGB 14.7 06/20/2021   HCT 44.2 06/20/2021   PLT 220 06/20/2021   GLUCOSE 123 (H) 08/14/2022   CHOL 81 (L) 08/14/2022   TRIG 117 08/14/2022   HDL 45 08/14/2022   LDLCALC 15 08/14/2022   ALT 18 08/14/2022   AST 23 08/14/2022   NA 141 08/14/2022   K 4.8 08/14/2022   CL 102 08/14/2022   CREATININE 0.87 08/14/2022   BUN 12 08/14/2022   CO2 23 08/14/2022   TSH 2.830 08/14/2022   INR 1.05 06/06/2013   HGBA1C 6.3 (A) 08/21/2022   MICROALBUR 80 mg/L 05/21/2022     Assessment & Plan:   Problem List Items Addressed This Visit       Cardiovascular and Mediastinum   Coronary artery disease involving native coronary artery with angina pectoris (HCC) - Primary    Stable.  Continue current medications.      Essential hypertension    BP mildly elevated today.  Has been well-controlled.  Continue losartan and metoprolol.  Will continue monitor closely.        Endocrine   Type 2 diabetes mellitus with hyperglycemia (HCC)    At goal.  After discussion today, we are increasing Ozempic to 2 mg.      Relevant Medications   Semaglutide, 2 MG/DOSE, 8 MG/3ML SOPN     Other   Mixed hyperlipidemia    At goal.  Continue Praluent and Crestor.       Meds ordered this encounter  Medications   Semaglutide, 2 MG/DOSE, 8  MG/3ML SOPN    Sig: Inject 2 mg as directed once a week.  Dispense:  9 mL    Refill:  3    Follow-up:  Return in about 6 months (around 04/08/2023).  Everlene Other DO [facility]

## 2022-10-06 NOTE — Assessment & Plan Note (Signed)
At goal.  After discussion today, we are increasing Ozempic to 2 mg.

## 2022-10-06 NOTE — Assessment & Plan Note (Signed)
BP mildly elevated today.  Has been well-controlled.  Continue losartan and metoprolol.  Will continue monitor closely.

## 2022-10-06 NOTE — Assessment & Plan Note (Signed)
 Stable.  Continue current medications.

## 2022-10-06 NOTE — Assessment & Plan Note (Signed)
At goal.  Continue Praluent and Crestor.

## 2022-10-06 NOTE — Patient Instructions (Signed)
Ozempic increased.   Follow up in 6 months.  Take care  Dr. Adriana Simas

## 2022-12-22 ENCOUNTER — Encounter: Payer: Self-pay | Admitting: Nurse Practitioner

## 2022-12-22 ENCOUNTER — Ambulatory Visit (INDEPENDENT_AMBULATORY_CARE_PROVIDER_SITE_OTHER): Payer: Medicare Other | Admitting: Nurse Practitioner

## 2022-12-22 VITALS — BP 120/76 | HR 67 | Ht 67.0 in | Wt 253.0 lb

## 2022-12-22 DIAGNOSIS — Z7985 Long-term (current) use of injectable non-insulin antidiabetic drugs: Secondary | ICD-10-CM

## 2022-12-22 DIAGNOSIS — E782 Mixed hyperlipidemia: Secondary | ICD-10-CM

## 2022-12-22 DIAGNOSIS — E1165 Type 2 diabetes mellitus with hyperglycemia: Secondary | ICD-10-CM

## 2022-12-22 DIAGNOSIS — I10 Essential (primary) hypertension: Secondary | ICD-10-CM | POA: Diagnosis not present

## 2022-12-22 DIAGNOSIS — Z7984 Long term (current) use of oral hypoglycemic drugs: Secondary | ICD-10-CM

## 2022-12-22 DIAGNOSIS — E559 Vitamin D deficiency, unspecified: Secondary | ICD-10-CM | POA: Diagnosis not present

## 2022-12-22 LAB — POCT GLYCOSYLATED HEMOGLOBIN (HGB A1C): Hemoglobin A1C: 6.7 % — AB (ref 4.0–5.6)

## 2022-12-22 NOTE — Progress Notes (Unsigned)
Endocrinology Follow Up Note       12/22/2022, 2:28 PM   Subjective:    Patient ID: [name], male    DOB: 03-Mar-1956.  [name] is being seen in follow up after being seen in consultation for management of currently uncontrolled symptomatic diabetes requested by  Tommie Sams, DO.   Past Medical History:  Diagnosis Date  . Arthritis   . CAD (coronary artery disease) 2000   a. History of MI ~2000 s/p PCI. b. CABG in 2006 (LIMA-LAD, RIMA-acute marginal, SVG-DX, SVG-RCA). c. NSTEMI 05/2013:severe stenoses of the SVG-PDA and SVG-diagonal, both treated successfully with PCI (DES x2).  . Diabetes (HCC)   . GERD (gastroesophageal reflux disease)   . Gout   . History of hypertension   . Hydrocele 2012  . Hyperlipidemia   . Hypertension   . Myocardial infarction (HCC) 2014   x3  . Obesity   . S/P coronary angioplasty 11/10/17 11/11/2017    Past Surgical History:  Procedure Laterality Date  . BIOPSY  09/17/2021   Procedure: BIOPSY;  Surgeon: Dolores Frame, MD;  [facility];  Service: Gastroenterology;;  . CATARACT EXTRACTION W/PHACO Left 05/17/2012   Procedure: CATARACT EXTRACTION PHACO AND INTRAOCULAR LENS PLACEMENT (IOC);  Surgeon: Gemma Payor, MD;  Location: [facility];  Service: Ophthalmology;  Laterality: Left;  CDE:  0.76  . CATARACT EXTRACTION W/PHACO Right 08/26/2012   Procedure: CATARACT EXTRACTION PHACO AND INTRAOCULAR LENS PLACEMENT (IOC);  Surgeon: Gemma Payor, MD;  Location: [facility];  Service: Ophthalmology;  Laterality: Right;  CDE: 1.13  . COLONOSCOPY WITH PROPOFOL N/A 09/17/2021   Procedure: COLONOSCOPY WITH PROPOFOL;  Surgeon: Dolores Frame, MD;  [facility];  Service: Gastroenterology;  Laterality: N/A;  730  . CORONARY ANGIOPLASTY  2000   stents x2  . CORONARY ARTERY BYPASS GRAFT  2006   4 vessels  . Coronary artery bypass grafting  May 2006    Four-vessel  . CORONARY BALLOON ANGIOPLASTY N/A 11/10/2017   Procedure: CORONARY BALLOON ANGIOPLASTY;  Surgeon: Tonny Bollman, MD;  Location: [facility] INVASIVE CV LAB;  Service: Cardiovascular;  Laterality: N/A;  . FOOT FRACTURE SURGERY     right  . HEMOSTASIS CLIP PLACEMENT  09/17/2021   Procedure: HEMOSTASIS CLIP PLACEMENT;  Surgeon: Dolores Frame, MD;  [facility];  Service: Gastroenterology;;  . LEFT HEART CATH AND CORS/GRAFTS ANGIOGRAPHY N/A 11/10/2017   Procedure: LEFT HEART CATH AND CORS/GRAFTS ANGIOGRAPHY;  Surgeon: Tonny Bollman, MD;  Location: [facility] INVASIVE CV LAB;  Service: Cardiovascular;  Laterality: N/A;  . LEFT HEART CATHETERIZATION WITH CORONARY/GRAFT ANGIOGRAM N/A 05/05/2011   Procedure: LEFT HEART CATHETERIZATION WITH Isabel Caprice;  Surgeon: Wendall Stade, MD;  Location: [facility] CATH LAB;  Service: Cardiovascular;  Laterality: N/A;  . LEFT HEART CATHETERIZATION WITH CORONARY/GRAFT ANGIOGRAM N/A 06/06/2013   Procedure: LEFT HEART CATHETERIZATION WITH Isabel Caprice;  Surgeon: Micheline Chapman, MD;  Location: [facility] CATH LAB;  Service: Cardiovascular;  Laterality: N/A;  . MASS EXCISION Right 05/14/2016   Procedure: EXCISION MASS RIGHT ELBOW;  Surgeon: Vickki Hearing, MD;  Location: [facility];  Service: Orthopedics;  Laterality: Right;  . PILONIDAL CYST  EXCISION    . POLYPECTOMY  09/17/2021   Procedure: POLYPECTOMY;  Surgeon: Dolores Frame, MD;  [facility];  Service: Gastroenterology;;  . TONSILLECTOMY      Social History   Socioeconomic History  . Marital status: Divorced    Spouse name: Not on file  . Number of children: Not on file  . Years of education: Not on file  . Highest education level: Not on file  Occupational History  . Not on file  Tobacco Use  . Smoking status: Never  . Smokeless tobacco: Never  Vaping Use  . Vaping status: Never Used  Substance and Sexual Activity  . Alcohol use: No  . Drug use:  No  . Sexual activity: Yes    Birth control/protection: None  Other Topics Concern  . Not on file  Social History Narrative  . Not on file   Social Determinants of Health   Financial Resource Strain: Low Risk  (01/08/2022)   Overall Financial Resource Strain (CARDIA)   . Difficulty of Paying Living Expenses: Not hard at all  Food Insecurity: No Food Insecurity (01/08/2022)   Hunger Vital Sign   . Worried About Programme researcher, broadcasting/film/video in the Last Year: Never true   . Ran Out of Food in the Last Year: Never true  Transportation Needs: No Transportation Needs (01/08/2022)   PRAPARE - Transportation   . Lack of Transportation (Medical): No   . Lack of Transportation (Non-Medical): No  Physical Activity: Insufficiently Active (01/08/2022)   Exercise Vital Sign   . Days of Exercise per Week: 2 days   . Minutes of Exercise per Session: 20 min  Stress: No Stress Concern Present (01/08/2022)   [employer] of Occupational Health - Occupational Stress Questionnaire   . Feeling of Stress : Not at all  Social Connections: Moderately Isolated (01/08/2022)   Social Connection and Isolation Panel [NHANES]   . Frequency of Communication with Friends and Family: More than three times a week   . Frequency of Social Gatherings with Friends and Family: Once a week   . Attends Religious Services: More than 4 times per year   . Active Member of Clubs or Organizations: No   . Attends Banker Meetings: Never   . Marital Status: Divorced    Family History  Problem Relation Age of Onset  . Cancer Mother   . Cervical cancer Mother   . Cancer Father   . Throat cancer Father   . Thyroid disease Son   . Healthy Daughter   . Healthy Son     Outpatient Encounter Medications as of 12/22/2022  Medication Sig  . Alirocumab (PRALUENT) 75 MG/ML SOAJ ADMINISTER 1 ML UNDER THE SKIN EVERY 14 DAYS  . aspirin EC 81 MG tablet Take 81 mg by mouth daily.  . blood glucose meter kit and supplies  KIT 1 each by Does not apply route daily. Dispense based on patient and insurance preference. Use to check blood glucose daily as directed.  . clopidogrel (PLAVIX) 75 MG tablet TAKE 1 TABLET BY MOUTH EVERY DAY  . glucose blood (ONETOUCH VERIO) test strip Use to check BG daily as instructed  . isosorbide mononitrate (IMDUR) 60 MG 24 hr tablet Take 1 tablet (60 mg total) by mouth daily.  . Lancets (ONETOUCH DELICA PLUS LANCET33G) MISC Use to monitor glucose twice daily.  Marland Kitchen losartan (COZAAR) 25 MG tablet Take 1 tablet (25 mg total) by mouth daily.  . metFORMIN (GLUCOPHAGE-XR) 500  MG 24 hr tablet Take 2 tablets (1,000 mg total) by mouth daily with breakfast.  . metoprolol tartrate (LOPRESSOR) 25 MG tablet TAKE 1 TABLET BY MOUTH TWICE A DAY  . nitroGLYCERIN (NITROSTAT) 0.4 MG SL tablet PLACE 1 TABLET UNDER THE TONGUE EVERY 5 MINUTES AS NEEDED FOR CHEST PAIN.  . rosuvastatin (CRESTOR) 40 MG tablet TAKE 1 TABLET BY MOUTH EVERY DAY  . Semaglutide, 2 MG/DOSE, 8 MG/3ML SOPN Inject 2 mg as directed once a week.   No facility-administered encounter medications on file as of 12/22/2022.    ALLERGIES: Allergies  Allergen Reactions  . Hydrocodone Nausea Only    VACCINATION STATUS: Immunization History  Administered Date(s) Administered  . Fluad Quad(high Dose 65+) 03/23/2020  . Influenza, High Dose Seasonal PF 03/12/2021  . Influenza, Quadrivalent, Recombinant, Inj, Pf 01/25/2019  . Influenza,trivalent, recombinat, inj, PF 01/09/2017  . Influenza-Unspecified 01/22/2018  . Moderna Sars-Covid-2 Vaccination 05/20/2019, 06/21/2019, 03/23/2020  . PNEUMOCOCCAL CONJUGATE-20 06/20/2021  . Tdap 03/24/2014  . Zoster Recombinant(Shingrix) 10/16/2018, 01/25/2019    Diabetes He presents for his follow-up diabetic visit. He has type 2 diabetes mellitus. Onset time: Recently diagnosed at age 85. His disease course has been stable. There are no hypoglycemic associated symptoms. Associated symptoms include  blurred vision and fatigue. Pertinent negatives for diabetes include no polydipsia, no polyuria and no weight loss. There are no hypoglycemic complications. Symptoms are improving. Diabetic complications include heart disease and nephropathy. Risk factors for coronary artery disease include diabetes mellitus, dyslipidemia, family history, obesity, male sex, hypertension and sedentary lifestyle. Current diabetic treatment includes oral agent (monotherapy) (and Ozempic). He is compliant with treatment most of the time. His weight is decreasing steadily. He is following a generally healthy diet. When asked about meal planning, he reported none. He has not had a previous visit with a dietitian. He never participates in exercise. His home blood glucose trend is decreasing steadily. His breakfast blood glucose range is generally 110-130 mg/dl. (He presents today with his meter and logs showing stable, at goal fasting glycemic profile.  His POCT A1c today is 6.3%, improving from last visit of 6.9%.  He denies any hypoglycemia.  He does note that he had trouble getting his Ozempic due to drug shortage for a period of time but is now back on it.) An ACE inhibitor/angiotensin II receptor blocker is not being taken. He sees a podiatrist.Eye exam is current.  Hypertension This is a chronic problem. The current episode started more than 1 year ago. The problem has been resolved since onset. The problem is controlled. Associated symptoms include blurred vision. There are no associated agents to hypertension. Risk factors for coronary artery disease include diabetes mellitus, dyslipidemia, family history, obesity and male gender. Past treatments include beta blockers. The current treatment provides mild improvement. Compliance problems include diet and exercise.  Hypertensive end-organ damage includes kidney disease and CAD/MI. Identifiable causes of hypertension include chronic renal disease.  Hyperlipidemia This is a chronic  problem. The current episode started more than 1 year ago. The problem is controlled. Recent lipid tests were reviewed and are normal. Exacerbating diseases include chronic renal disease and diabetes. Factors aggravating his hyperlipidemia include fatty foods and beta blockers. Current antihyperlipidemic treatment includes statins. The current treatment provides moderate improvement of lipids. Compliance problems include adherence to diet and adherence to exercise.  Risk factors for coronary artery disease include diabetes mellitus, dyslipidemia, family history, obesity, male sex and hypertension.     Review of systems  Constitutional: + decreasing  body weight,  current Body mass index is 39.63 kg/m. , no fatigue, no subjective hyperthermia, no subjective hypothermia Eyes: no blurry vision, no xerophthalmia ENT: no sore throat, no nodules palpated in throat, no dysphagia/odynophagia, no hoarseness Cardiovascular: no chest pain, no shortness of breath, no palpitations, no leg swelling Respiratory: no cough, no shortness of breath Gastrointestinal: no nausea/vomiting/diarrhea, + alternating constipation/diarrhea Musculoskeletal: no muscle/joint aches Skin: no rashes, no hyperemia Neurological: no tremors, no numbness, no tingling, no dizziness Psychiatric: no depression, no anxiety  Objective:     BP 120/76 (BP Location: Left Arm, Patient Position: Sitting, Cuff Size: Large)   Pulse 67   Ht 5\' 7"  (1.702 m)   Wt 253 lb (114.8 kg)   BMI 39.63 kg/m   Wt Readings from Last 3 Encounters:  12/22/22 253 lb (114.8 kg)  10/06/22 251 lb (113.9 kg)  08/21/22 248 lb (112.5 kg)     BP Readings from Last 3 Encounters:  12/22/22 120/76  10/06/22 (!) 142/86  08/21/22 130/74     Physical Exam- Limited  Constitutional:  Body mass index is 39.63 kg/m. , not in acute distress, normal state of mind Eyes:  EOMI, no exophthalmos Neck: Supple Musculoskeletal: no gross deformities, strength intact  in all four extremities, no gross restriction of joint movements Skin:  no rashes, no hyperemia Neurological: no tremor with outstretched hands   Diabetic Foot Exam - Simple   No data filed    CMP ( most recent) CMP     Component Value Date/Time   NA 141 08/14/2022 0813   K 4.8 08/14/2022 0813   CL 102 08/14/2022 0813   CO2 23 08/14/2022 0813   GLUCOSE 123 (H) 08/14/2022 0813   GLUCOSE 140 (H) 09/13/2021 0919   BUN 12 08/14/2022 0813   CREATININE 0.87 08/14/2022 0813   CREATININE 1.00 05/07/2015 0834   CALCIUM 9.4 08/14/2022 0813   PROT 6.7 08/14/2022 0813   ALBUMIN 4.5 08/14/2022 0813   AST 23 08/14/2022 0813   ALT 18 08/14/2022 0813   ALKPHOS 80 08/14/2022 0813   BILITOT 1.0 08/14/2022 0813   GFRNONAA >60 09/13/2021 0919   GFRAA 88 11/16/2018 1027     Diabetic Labs (most recent): Lab Results  Component Value Date   HGBA1C 6.3 (A) 08/21/2022   HGBA1C 6.9 (H) 03/21/2022   HGBA1C 7.0 (H) 12/20/2021   MICROALBUR 80 mg/L 05/21/2022     Lipid Panel ( most recent) Lipid Panel     Component Value Date/Time   CHOL 81 (L) 08/14/2022 0813   TRIG 117 08/14/2022 0813   HDL 45 08/14/2022 0813   CHOLHDL 1.8 08/14/2022 0813   CHOLHDL 3.4 05/07/2015 0834   VLDL 16 05/07/2015 0834   LDLCALC 15 08/14/2022 0813   LABVLDL 21 08/14/2022 0813      Lab Results  Component Value Date   TSH 2.830 08/14/2022   TSH 2.270 06/27/2020   TSH 1.478 05/03/2011   FREET4 1.29 08/14/2022           Assessment & Plan:   1) Type 2 diabetes mellitus with hyperglycemia, without long-term current use of insulin (HCC)  He presents today with his meter and logs showing stable, at goal fasting glycemic profile.  His POCT A1c today is 6.3%, improving from last visit of 6.9%.  He denies any hypoglycemia.  He does note that he had trouble getting his Ozempic due to drug shortage for a period of time but is now back on it.  - [name] has currently uncontrolled symptomatic type 2  DM since 67 years of age.   -Recent labs reviewed.  - I had a long discussion with him about the progressive nature of diabetes and the pathology behind its complications. -his diabetes is complicated by CAD with MI and mild CKD and he remains at a high risk for more acute and chronic complications which include CAD, CVA, CKD, retinopathy, and neuropathy. These are all discussed in detail with him.  The following Lifestyle Medicine recommendations according to American College of Lifestyle Medicine [facility]) were discussed and offered to patient and he agrees to start the journey:  A. Whole Foods, Plant-based plate comprising of fruits and vegetables, plant-based proteins, whole-grain carbohydrates was discussed in detail with the patient.   A list for source of those nutrients were also provided to the patient.  Patient will use only water or unsweetened tea for hydration. B.  The need to stay away from risky substances including alcohol, smoking; obtaining 7 to 9 hours of restorative sleep, at least 150 minutes of moderate intensity exercise weekly, the importance of healthy social connections,  and stress reduction techniques were discussed. C.  A full color page of  Calorie density of various food groups per pound showing examples of each food groups was provided to the patient.  - Nutritional counseling repeated at each appointment due to patients tendency to fall back in to old habits.  - The patient admits there is a room for improvement in their diet and drink choices. -  Suggestion is made for the patient to avoid simple carbohydrates from their diet including Cakes, Sweet Desserts / Pastries, Ice Cream, Soda (diet and regular), Sweet Tea, Candies, Chips, Cookies, Sweet Pastries, Store Bought Juices, Alcohol in Excess of 1-2 drinks a day, Artificial Sweeteners, Coffee Creamer, and "Sugar-free" Products. This will help patient to have stable blood glucose profile and potentially avoid unintended  weight gain.   - I encouraged the patient to switch to unprocessed or minimally processed complex starch and increased protein intake (animal or plant source), fruits, and vegetables.   - Patient is advised to stick to a routine mealtimes to eat 3 meals a day and avoid unnecessary snacks (to snack only to correct hypoglycemia).  - I have approached him with the following individualized plan to manage his diabetes and patient agrees:   - He is advised to continue Metformin 1000 mg ER daily with breakfast and Ozempic 1 mg SQ weekly.  Says the copay for this is over $200 per month (thinks he is in the donut hole).  I did give him PAP forms to fill out.  If he does not qualify for that, he may need to ask insurance for a tier exception to get his copay to more affordable range.  Given his history of MI x 3, he definitely needs GLP1 to prevent another occurrence.   -he is encouraged to continue monitoring blood glucose daily, before breakfast, and to call the clinic if he has readings less than 70 or above 300 for 3 tests in a row.  He is on safe regimen and doesn't need to monitor glucose every day but prefers to continue to prevent losing control.  - Adjustment parameters are given to him for hypo and hyperglycemia in writing.  - Specific targets for  A1c; LDL, HDL, and Triglycerides were discussed with the patient.  2) Blood Pressure /Hypertension:  his blood pressure is controlled to target.   he is advised to  continue his current medications including Metoprolol 25 mg p.o. twice daily.  3) Lipids/Hyperlipidemia:    Review of his recent lipid panel from 08/14/22 showed controlled LDL at 15 .  he is advised to continue Crestor 40 mg daily at bedtime.  Side effects and precautions discussed with him.  4)  Weight/Diet:  his Body mass index is 39.63 kg/m.  -  clearly complicating his diabetes care.   he is a candidate for weight loss. I discussed with him the fact that loss of 5 - 10% of his   current body weight will have the most impact on his diabetes management.  Exercise, and detailed carbohydrates information provided  -  detailed on discharge instructions.  5) Chronic Care/Health Maintenance: -he is not on ACEI/ARB and is on Statin medications and is encouraged to initiate and continue to follow up with Ophthalmology, Dentist, Podiatrist at least yearly or according to recommendations, and advised to stay away from smoking. I have recommended yearly flu vaccine and pneumonia vaccine at least every 5 years; moderate intensity exercise for up to 150 minutes weekly; and sleep for at least 7 hours a day.  - he is advised to maintain close follow up with Tommie Sams, DO for primary care needs, as well as his other providers for optimal and coordinated care.      I spent  35  minutes in the care of the patient today including review of labs from CMP, Lipids, Thyroid Function, Hematology (current and previous including abstractions from other facilities); face-to-face time discussing  his blood glucose readings/logs, discussing hypoglycemia and hyperglycemia episodes and symptoms, medications doses, his options of short and long term treatment based on the latest standards of care / guidelines;  discussion about incorporating lifestyle medicine;  and documenting the encounter. Risk reduction counseling performed per USPSTF guidelines to reduce obesity and cardiovascular risk factors.     Please refer to Patient Instructions for Blood Glucose Monitoring and Insulin/Medications Dosing Guide"  in media tab for additional information. Please  also refer to " Patient Self Inventory" in the Media  tab for reviewed elements of pertinent patient history.  [name] participated in the discussions, expressed understanding, and voiced agreement with the above plans.  All questions were answered to his satisfaction. he is encouraged to contact clinic should he have any questions or concerns  prior to his return visit.     Follow up plan: - No follow-ups on file.   Ronny Bacon, [facility] [address] [place], [place] [address] Phone: [phone] Fax: [phone]  12/22/2022, 2:28 PM

## 2023-01-20 ENCOUNTER — Telehealth: Payer: Self-pay | Admitting: Nurse Practitioner

## 2023-01-20 DIAGNOSIS — E1165 Type 2 diabetes mellitus with hyperglycemia: Secondary | ICD-10-CM

## 2023-01-20 MED ORDER — ONETOUCH VERIO VI STRP
ORAL_STRIP | 5 refills | Status: DC
Start: 2023-01-20 — End: 2023-04-23

## 2023-01-20 NOTE — Telephone Encounter (Signed)
Patient needs a refill on his one touch test strips or accu check. He said he has both meters.  Patient uses [facility] in [place]

## 2023-01-20 NOTE — Telephone Encounter (Signed)
Rx sent 

## 2023-01-22 ENCOUNTER — Telehealth: Payer: Self-pay | Admitting: Cardiovascular Disease

## 2023-01-22 DIAGNOSIS — E782 Mixed hyperlipidemia: Secondary | ICD-10-CM

## 2023-01-22 DIAGNOSIS — I25119 Atherosclerotic heart disease of native coronary artery with unspecified angina pectoris: Secondary | ICD-10-CM

## 2023-01-22 NOTE — Telephone Encounter (Signed)
Pt c/o medication issue:  1. Name of Medication:   Alirocumab (PRALUENT) 75 MG/ML SOAJ   2. How are you currently taking this medication (dosage and times per day)?   As prescribed  3. Are you having a reaction (difficulty breathing--STAT)?   No  4. What is your medication issue?   Patient stated his insurance company will not pay for this medication starting in January 2025.  Patient wants to be put back on Repatha as his insurance company will pay for Repatha.

## 2023-01-22 NOTE — Telephone Encounter (Signed)
Call to patient. He picks up then the phone disconnects. Tried to call back x3 busy signal

## 2023-01-23 ENCOUNTER — Ambulatory Visit: Payer: Medicare Other

## 2023-01-23 VITALS — Ht 67.0 in | Wt 253.0 lb

## 2023-01-23 DIAGNOSIS — Z0000 Encounter for general adult medical examination without abnormal findings: Secondary | ICD-10-CM

## 2023-01-23 NOTE — Patient Instructions (Signed)
[name] , Thank you for taking time to come for your Medicare Wellness Visit. I appreciate your ongoing commitment to your health goals. Please review the following plan we discussed and let me know if I can assist you in the future.   Referrals/Orders/Follow-Ups/Clinician Recommendations: Aim for 30 minutes of exercise or brisk walking, 6-8 glasses of water, and 5 servings of fruits and vegetables each day.   This is a list of the screening recommended for you and due dates:  Health Maintenance  Topic Date Due   Complete foot exam   10/16/2022   Flu Shot  10/23/2022   COVID-19 Vaccine (4 - 2023-24 season) 11/23/2022   Eye exam for diabetics  05/15/2023   Yearly kidney health urinalysis for diabetes  05/22/2023   Hemoglobin A1C  06/21/2023   Yearly kidney function blood test for diabetes  08/14/2023   Medicare Annual Wellness Visit  01/23/2024   DTaP/Tdap/Td vaccine (2 - Td or Tdap) 03/24/2024   Colon Cancer Screening  09/17/2024   Pneumonia Vaccine  Completed   Zoster (Shingles) Vaccine  Completed   HPV Vaccine  Aged Out   Hepatitis C Screening  Discontinued    Advanced directives: (Provided) Advance directive discussed with you today. I have provided a copy for you to complete at home and have notarized. Once this is complete, please bring a copy in to our office so we can scan it into your chart. Information on Advanced Care Planning can be found at [facility] [place] Advance Health Care Directives Advance Health Care Directives ([email or web address]    Next Medicare Annual Wellness Visit scheduled for next year: Yes  insert Preventive Care attachment Insert FALL PREVENTION attachment if needed

## 2023-01-23 NOTE — Progress Notes (Signed)
Subjective:   [name] is a 67 y.o. male who presents for Medicare Annual/Subsequent preventive examination.  Visit Complete: Virtual I connected with  Andres Shad on 01/23/23 by a audio enabled telemedicine application and verified that I am speaking with the correct person using two identifiers.  Patient Location: Home  Provider Location: Home Office  I discussed the limitations of evaluation and management by telemedicine. The patient expressed understanding and agreed to proceed.  Vital Signs: Because this visit was a virtual/telehealth visit, some criteria may be missing or patient reported. Any vitals not documented were not able to be obtained and vitals that have been documented are patient reported.  Patient Medicare AWV questionnaire was completed by the patient on 01/23/2023; I have confirmed that all information answered by patient is correct and no changes since this date.  Cardiac Risk Factors include: advanced age (>68men, >50 women);diabetes mellitus;dyslipidemia;male gender;hypertension     Objective:    Today's Vitals   01/23/23 1303  Weight: 253 lb (114.8 kg)  Height: 5\' 7"  (1.702 m)   Body mass index is 39.63 kg/m.     01/23/2023    1:07 PM 01/08/2022    1:13 PM 09/17/2021    6:37 AM 11/10/2017   11:56 AM 05/14/2016    8:43 AM 05/09/2016    9:27 AM 11/14/2015    4:17 PM  Advanced Directives  Does Patient Have a Medical Advance Directive? No No No No No No No  Does patient want to make changes to medical advance directive?     No - Patient declined    Would patient like information on creating a medical advance directive? Yes (MAU/Ambulatory/Procedural Areas - Information given) No - Patient declined No - Patient declined No - Patient declined No - Patient declined No - Patient declined No - patient declined information    Current Medications (verified) Outpatient Encounter Medications as of 01/23/2023  Medication Sig   Alirocumab (PRALUENT)  75 MG/ML SOAJ ADMINISTER 1 ML UNDER THE SKIN EVERY 14 DAYS   aspirin EC 81 MG tablet Take 81 mg by mouth daily.   blood glucose meter kit and supplies KIT 1 each by Does not apply route daily. Dispense based on patient and insurance preference. Use to check blood glucose daily as directed.   clopidogrel (PLAVIX) 75 MG tablet TAKE 1 TABLET BY MOUTH EVERY DAY   glucose blood (ACCU-CHEK GUIDE) test strip 1 each by Other route as needed for other. Use as instructed   glucose blood (ONETOUCH VERIO) test strip Use to check BG ONCE daily as instructed. E11.65   isosorbide mononitrate (IMDUR) 60 MG 24 hr tablet Take 1 tablet (60 mg total) by mouth daily.   Lancets (ONETOUCH DELICA PLUS LANCET33G) MISC Use to monitor glucose twice daily.   losartan (COZAAR) 25 MG tablet Take 1 tablet (25 mg total) by mouth daily.   metFORMIN (GLUCOPHAGE-XR) 500 MG 24 hr tablet Take 2 tablets (1,000 mg total) by mouth daily with breakfast.   metoprolol tartrate (LOPRESSOR) 25 MG tablet TAKE 1 TABLET BY MOUTH TWICE A DAY   nitroGLYCERIN (NITROSTAT) 0.4 MG SL tablet PLACE 1 TABLET UNDER THE TONGUE EVERY 5 MINUTES AS NEEDED FOR CHEST PAIN.   rosuvastatin (CRESTOR) 40 MG tablet TAKE 1 TABLET BY MOUTH EVERY DAY   Semaglutide, 2 MG/DOSE, 8 MG/3ML SOPN Inject 2 mg as directed once a week.   No facility-administered encounter medications on file as of 01/23/2023.    Allergies (verified) Hydrocodone  History: Past Medical History:  Diagnosis Date   Arthritis    CAD (coronary artery disease) 2000   a. History of MI ~2000 s/p PCI. b. CABG in 2006 (LIMA-LAD, RIMA-acute marginal, SVG-DX, SVG-RCA). c. NSTEMI 05/2013:severe stenoses of the SVG-PDA and SVG-diagonal, both treated successfully with PCI (DES x2).   Diabetes (HCC)    GERD (gastroesophageal reflux disease)    Gout    History of hypertension    Hydrocele 2012   Hyperlipidemia    Hypertension    Myocardial infarction [facility]) 2014   x3   Obesity    S/P coronary  angioplasty 11/10/17 11/11/2017   Past Surgical History:  Procedure Laterality Date   BIOPSY  09/17/2021   Procedure: BIOPSY;  Surgeon: Dolores Frame, MD;  Location: [facility];  Service: Gastroenterology;;   CATARACT EXTRACTION W/PHACO Left 05/17/2012   Procedure: CATARACT EXTRACTION PHACO AND INTRAOCULAR LENS PLACEMENT (IOC);  Surgeon: Gemma Payor, MD;  Location: [facility] ORS;  Service: Ophthalmology;  Laterality: Left;  CDE:  0.76   CATARACT EXTRACTION W/PHACO Right 08/26/2012   Procedure: CATARACT EXTRACTION PHACO AND INTRAOCULAR LENS PLACEMENT (IOC);  Surgeon: Gemma Payor, MD;  Location: [facility];  Service: Ophthalmology;  Laterality: Right;  CDE: 1.13   COLONOSCOPY WITH PROPOFOL N/A 09/17/2021   Procedure: COLONOSCOPY WITH PROPOFOL;  Surgeon: Dolores Frame, MD;  Location: [facility];  Service: Gastroenterology;  Laterality: N/A;  730   CORONARY ANGIOPLASTY  2000   stents x2   CORONARY ARTERY BYPASS GRAFT  2006   4 vessels   Coronary artery bypass grafting  May 2006   Four-vessel   CORONARY BALLOON ANGIOPLASTY N/A 11/10/2017   Procedure: CORONARY BALLOON ANGIOPLASTY;  Surgeon: Tonny Bollman, MD;  Location: [facility] INVASIVE CV LAB;  Service: Cardiovascular;  Laterality: N/A;   FOOT FRACTURE SURGERY     right   HEMOSTASIS CLIP PLACEMENT  09/17/2021   Procedure: HEMOSTASIS CLIP PLACEMENT;  Surgeon: Dolores Frame, MD;  [facility];  Service: Gastroenterology;;   LEFT HEART CATH AND CORS/GRAFTS ANGIOGRAPHY N/A 11/10/2017   Procedure: LEFT HEART CATH AND CORS/GRAFTS ANGIOGRAPHY;  Surgeon: Tonny Bollman, MD;  Location: [facility] INVASIVE CV LAB;  Service: Cardiovascular;  Laterality: N/A;   LEFT HEART CATHETERIZATION WITH CORONARY/GRAFT ANGIOGRAM N/A 05/05/2011   Procedure: LEFT HEART CATHETERIZATION WITH Isabel Caprice;  Surgeon: Wendall Stade, MD;  Location: [facility] CATH LAB;  Service: Cardiovascular;  Laterality: N/A;   LEFT HEART CATHETERIZATION WITH  CORONARY/GRAFT ANGIOGRAM N/A 06/06/2013   Procedure: LEFT HEART CATHETERIZATION WITH Isabel Caprice;  Surgeon: Micheline Chapman, MD;  Location: [facility] CATH LAB;  Service: Cardiovascular;  Laterality: N/A;   MASS EXCISION Right 05/14/2016   Procedure: EXCISION MASS RIGHT ELBOW;  Surgeon: Vickki Hearing, MD;  Location: [facility];  Service: Orthopedics;  Laterality: Right;   PILONIDAL CYST EXCISION     POLYPECTOMY  09/17/2021   Procedure: POLYPECTOMY;  Surgeon: Dolores Frame, MD;  [facility];  Service: Gastroenterology;;   TONSILLECTOMY     Family History  Problem Relation Age of Onset   Cancer Mother    Cervical cancer Mother    Cancer Father    Throat cancer Father    Thyroid disease Son    Healthy Daughter    Healthy Son    Social History   Socioeconomic History   Marital status: Divorced    Spouse name: Not on file   Number of children: Not on file   Years of education:  Not on file   Highest education level: Not on file  Occupational History   Not on file  Tobacco Use   Smoking status: Never   Smokeless tobacco: Never  Vaping Use   Vaping status: Never Used  Substance and Sexual Activity   Alcohol use: No   Drug use: No   Sexual activity: Yes    Birth control/protection: None  Other Topics Concern   Not on file  Social History Narrative   Not on file   Social Determinants of Health   Financial Resource Strain: Low Risk  (01/23/2023)   Overall Financial Resource Strain (CARDIA)    Difficulty of Paying Living Expenses: Not hard at all  Food Insecurity: No Food Insecurity (01/23/2023)   Hunger Vital Sign    Worried About Running Out of Food in the Last Year: Never true    Ran Out of Food in the Last Year: Never true  Transportation Needs: No Transportation Needs (01/23/2023)   PRAPARE - Administrator, Civil Service (Medical): No    Lack of Transportation (Non-Medical): No  Physical Activity: Insufficiently Active (01/23/2023)    Exercise Vital Sign    Days of Exercise per Week: 3 days    Minutes of Exercise per Session: 30 min  Stress: No Stress Concern Present (01/23/2023)   [employer] of Occupational Health - Occupational Stress Questionnaire    Feeling of Stress : Not at all  Social Connections: Moderately Isolated (01/23/2023)   Social Connection and Isolation Panel [NHANES]    Frequency of Communication with Friends and Family: More than three times a week    Frequency of Social Gatherings with Friends and Family: More than three times a week    Attends Religious Services: More than 4 times per year    Active Member of [employer] or Organizations: No    Attends Engineer, structural: Never    Marital Status: Divorced    Tobacco Counseling Counseling given: Not Answered   Clinical Intake:  Pre-visit preparation completed: Yes  Pain : No/denies pain     Nutritional Risks: None Diabetes: Yes CBG done?: No Did pt. bring in CBG monitor from home?: No  How often do you need to have someone help you when you read instructions, pamphlets, or other written materials from your doctor or pharmacy?: 1 - Never  Interpreter Needed?: No  Information entered by :: Renie Ora, LPN   Activities of Daily Living    01/23/2023    1:07 PM 01/19/2023    9:12 AM  In your present state of health, do you have any difficulty performing the following activities:  Hearing? 0 0  Vision? 0 0  Difficulty concentrating or making decisions? 0 0  Walking or climbing stairs? 0 0  Dressing or bathing? 0 0  Doing errands, shopping? 0 0  Preparing Food and eating ? N N  Using the Toilet? N N  In the past six months, have you accidently leaked urine? N N  Do you have problems with loss of bowel control? N N  Managing your Medications? N N  Managing your Finances? N N  Housekeeping or managing your Housekeeping? N N    Patient Care Team: Tommie Sams, DO as PCP - General (Family Medicine) Tonny Bollman, MD as PCP - Cardiology (Cardiology)  Indicate any recent Medical Services you may have received from other than [facility] providers in the past year (date may be approximate).     Assessment:  This is a routine wellness examination for Saylor.  Hearing/Vision screen Vision Screening - Comments:: Wears rx glasses - up to date with routine eye exams with  Dr.Cotter    Goals Addressed             This Visit's Progress    DIET - EAT MORE FRUITS AND VEGETABLES   On track      Depression Screen    01/23/2023    1:06 PM 03/21/2022    8:39 AM 01/08/2022    1:09 PM 12/20/2021    8:35 AM 06/20/2021    8:35 AM  PHQ 2/9 Scores  PHQ - 2 Score 0 0 0 0 0  PHQ- 9 Score  0 0      Fall Risk    01/23/2023    1:04 PM 01/19/2023    9:12 AM 01/08/2022    1:15 PM 12/20/2021    8:35 AM 06/20/2021    8:35 AM  Fall Risk   Falls in the past year? 0 0 1 0 1  Number falls in past yr: 0 0 0 0 0  Injury with Fall? 0  0 0 1  Risk for fall due to : No Fall Risks  History of fall(s) No Fall Risks Impaired balance/gait  Follow up Falls prevention discussed  Falls evaluation completed;Falls prevention discussed Falls evaluation completed Falls evaluation completed;Education provided    MEDICARE RISK AT HOME: Medicare Risk at Home Any stairs in or around the home?: Yes If so, are there any without handrails?: No Home free of loose throw rugs in walkways, pet beds, electrical cords, etc?: Yes Adequate lighting in your home to reduce risk of falls?: Yes Life alert?: No Use of a cane, walker or w/c?: No Grab bars in the bathroom?: Yes Shower chair or bench in shower?: Yes Elevated toilet seat or a handicapped toilet?: Yes  TIMED UP AND GO:  Was the test performed?  No    Cognitive Function:        01/23/2023    1:08 PM 01/08/2022    1:15 PM  6CIT Screen  What Year? 0 points 0 points  What month? 0 points 0 points  What time? 0 points 0 points  Count back from 20 0 points 0 points   Months in reverse 0 points 0 points  Repeat phrase 0 points 0 points  Total Score 0 points 0 points    Immunizations Immunization History  Administered Date(s) Administered   Fluad Quad(high Dose 65+) 03/23/2020   Influenza, High Dose Seasonal PF 03/12/2021   Influenza, Quadrivalent, Recombinant, Inj, Pf 01/25/2019   Influenza,trivalent, recombinat, inj, PF 01/09/2017   Influenza-Unspecified 01/22/2018   Moderna Sars-Covid-2 Vaccination 05/20/2019, 06/21/2019, 03/23/2020   PNEUMOCOCCAL CONJUGATE-20 06/20/2021   Tdap 03/24/2014   Zoster Recombinant(Shingrix) 10/16/2018, 01/25/2019    TDAP status: Up to date  Flu Vaccine status: Due, Education has been provided regarding the importance of this vaccine. Advised may receive this vaccine at local pharmacy or Health Dept. Aware to provide a copy of the vaccination record if obtained from local pharmacy or Health Dept. Verbalized acceptance and understanding.  Pneumococcal vaccine status: Up to date  Covid-19 vaccine status: Completed vaccines  Qualifies for Shingles Vaccine? Yes   Zostavax completed Yes   Shingrix Completed?: Yes  Screening Tests Health Maintenance  Topic Date Due   FOOT EXAM  10/16/2022   INFLUENZA VACCINE  10/23/2022   COVID-19 Vaccine (4 - 2023-24 season) 11/23/2022   OPHTHALMOLOGY EXAM  05/15/2023  Diabetic kidney evaluation - Urine ACR  05/22/2023   HEMOGLOBIN A1C  06/21/2023   Diabetic kidney evaluation - eGFR measurement  08/14/2023   Medicare Annual Wellness (AWV)  01/23/2024   DTaP/Tdap/Td (2 - Td or Tdap) 03/24/2024   Colonoscopy  09/17/2024   Pneumonia Vaccine 34+ Years old  Completed   Zoster Vaccines- Shingrix  Completed   HPV VACCINES  Aged Out   Hepatitis C Screening  Discontinued    Health Maintenance  Health Maintenance Due  Topic Date Due   FOOT EXAM  10/16/2022   INFLUENZA VACCINE  10/23/2022   COVID-19 Vaccine (4 - 2023-24 season) 11/23/2022    Colorectal cancer screening:  Type of screening: Colonoscopy. Completed 09/17/2021. Repeat every 3 years  Lung Cancer Screening: (Low Dose CT Chest recommended if Age 35-80 years, 20 pack-year currently smoking OR have quit w/in 15years.) does not qualify.   Lung Cancer Screening Referral: n/a  Additional Screening:  Hepatitis C Screening: does not qualify; Completed 03/21/2022  Vision Screening: Recommended annual ophthalmology exams for early detection of glaucoma and other disorders of the eye. Is the patient up to date with their annual eye exam?  Yes  Who is the provider or what is the name of the office in which the patient attends annual eye exams? Dr. Charise Killian  If pt is not established with a provider, would they like to be referred to a provider to establish care? No .   Dental Screening: Recommended annual dental exams for proper oral hygiene  Diabetic Foot Exam: Diabetic Foot Exam: Overdue, Pt has been advised about the importance in completing this exam. Pt is scheduled for diabetic foot exam on next office visit .  Community Resource Referral / Chronic Care Management: CRR required this visit?  No   CCM required this visit?  No     Plan:     I have personally reviewed and noted the following in the patient's chart:   Medical and social history Use of alcohol, tobacco or illicit drugs  Current medications and supplements including opioid prescriptions. Patient is not currently taking opioid prescriptions. Functional ability and status Nutritional status Physical activity Advanced directives List of other physicians Hospitalizations, surgeries, and ER visits in previous 12 months Vitals Screenings to include cognitive, depression, and falls Referrals and appointments  In addition, I have reviewed and discussed with patient certain preventive protocols, quality metrics, and best practice recommendations. A written personalized care plan for preventive services as well as general preventive  health recommendations were provided to patient.     Lorrene [name], LPN   16/03/958   After Visit Summary: (MyChart) Due to this being a telephonic visit, the after visit summary with patients personalized plan was offered to patient via MyChart   Nurse Notes: none

## 2023-01-25 DIAGNOSIS — Z23 Encounter for immunization: Secondary | ICD-10-CM | POA: Diagnosis not present

## 2023-01-27 NOTE — Telephone Encounter (Signed)
Please complete PA for Repatha 

## 2023-01-28 ENCOUNTER — Other Ambulatory Visit (HOSPITAL_COMMUNITY): Payer: Self-pay

## 2023-01-28 ENCOUNTER — Telehealth: Payer: Self-pay | Admitting: Pharmacy Technician

## 2023-01-28 NOTE — Telephone Encounter (Signed)
Pharmacy Patient Advocate Encounter   Received notification from Pt Calls Messages that prior authorization for repatha is required/requested.   Insurance verification completed.   The patient is insured through [facility] .   Per test claim: PA required; PA submitted to above mentioned insurance via CoverMyMeds Key/confirmation #/EOC [id number] Status is pending

## 2023-01-29 ENCOUNTER — Other Ambulatory Visit (HOSPITAL_COMMUNITY): Payer: Self-pay

## 2023-01-29 MED ORDER — REPATHA SURECLICK 140 MG/ML ~~LOC~~ SOAJ
1.0000 mL | SUBCUTANEOUS | 1 refills | Status: DC
Start: 2023-01-29 — End: 2023-08-21

## 2023-01-29 NOTE — Telephone Encounter (Signed)
Pharmacy Patient Advocate Encounter  Received notification from [facility] that Prior Authorization for repatha has been APPROVED from 01/29/23 to 03/23/2098. Ran test claim, Copay is $0.00- one month. This test claim was processed through [facility]- copay amounts may vary at other pharmacies due to pharmacy/plan contracts, or as the patient moves through the different stages of their insurance plan.   PA #/Case ID/Reference #: [id number]

## 2023-01-29 NOTE — Telephone Encounter (Signed)
Repatha PA approved.

## 2023-02-26 ENCOUNTER — Other Ambulatory Visit: Payer: Self-pay | Admitting: Family Medicine

## 2023-03-04 ENCOUNTER — Other Ambulatory Visit: Payer: Self-pay | Admitting: Nurse Practitioner

## 2023-04-08 ENCOUNTER — Ambulatory Visit: Payer: Medicare Other | Admitting: Family Medicine

## 2023-04-22 ENCOUNTER — Telehealth: Payer: Self-pay

## 2023-04-22 ENCOUNTER — Ambulatory Visit (INDEPENDENT_AMBULATORY_CARE_PROVIDER_SITE_OTHER): Payer: Medicare Other | Admitting: Family Medicine

## 2023-04-22 ENCOUNTER — Encounter: Payer: Self-pay | Admitting: Family Medicine

## 2023-04-22 VITALS — BP 122/78 | HR 68 | Temp 97.7°F | Ht 67.0 in | Wt 253.0 lb

## 2023-04-22 DIAGNOSIS — I25119 Atherosclerotic heart disease of native coronary artery with unspecified angina pectoris: Secondary | ICD-10-CM | POA: Diagnosis not present

## 2023-04-22 DIAGNOSIS — E782 Mixed hyperlipidemia: Secondary | ICD-10-CM | POA: Diagnosis not present

## 2023-04-22 DIAGNOSIS — I10 Essential (primary) hypertension: Secondary | ICD-10-CM | POA: Diagnosis not present

## 2023-04-22 DIAGNOSIS — Z130 Encounter for screening for diseases of the blood and blood-forming organs and certain disorders involving the immune mechanism: Secondary | ICD-10-CM

## 2023-04-22 DIAGNOSIS — Z125 Encounter for screening for malignant neoplasm of prostate: Secondary | ICD-10-CM

## 2023-04-22 DIAGNOSIS — E1165 Type 2 diabetes mellitus with hyperglycemia: Secondary | ICD-10-CM

## 2023-04-22 MED ORDER — ISOSORBIDE MONONITRATE ER 60 MG PO TB24: 60.0000 mg | ORAL_TABLET | Freq: Every day | ORAL | 3 refills | Status: DC

## 2023-04-22 NOTE — Assessment & Plan Note (Signed)
Continue current medications.  Imdur refilled today.

## 2023-04-22 NOTE — Assessment & Plan Note (Signed)
Stable.  Continue current medications.

## 2023-04-22 NOTE — Assessment & Plan Note (Signed)
Stable.  Continue metoprolol and losartan.

## 2023-04-22 NOTE — Progress Notes (Signed)
Subjective:  Patient ID: [name], male    DOB: 17-Jan-1956  Age: 68 y.o. MRN: [id number]  CC:   Chief Complaint  Patient presents with   Follow-up    6 month f/u CAD, Hypertension and DM 2.     HPI:  68 year old male with below mentioned medical problems presents for follow-up.  Hypertension stable on losartan and metoprolol.  Lipids at goal on Crestor and Repatha.  Type 2 diabetes has been stable on metformin and semaglutide.  He denies any side effects.  Overall he states that he is feeling well.  No chest pain or shortness of breath.  No GI issues.  Needs foot exam today.  Needs labs today.  Patient Active Problem List   Diagnosis Date Noted   Mixed hyperlipidemia 06/20/2021   History of gout 06/20/2021   Type 2 diabetes mellitus with hyperglycemia (HCC) 06/20/2021   Coronary artery disease involving native coronary artery with angina pectoris (HCC)    Obesity    GERD (gastroesophageal reflux disease) 07/12/2012   Fatty liver 07/07/2011   Essential hypertension 08/22/2008    Social Hx   Social History   Socioeconomic History   Marital status: Divorced    Spouse name: Not on file   Number of children: Not on file   Years of education: Not on file   Highest education level: Some college, no degree  Occupational History   Not on file  Tobacco Use   Smoking status: Never   Smokeless tobacco: Never  Vaping Use   Vaping status: Never Used  Substance and Sexual Activity   Alcohol use: No   Drug use: No   Sexual activity: Yes    Birth control/protection: None  Other Topics Concern   Not on file  Social History Narrative   Not on file   Social Drivers of Health   Financial Resource Strain: Low Risk  (04/19/2023)   Overall Financial Resource Strain (CARDIA)    Difficulty of Paying Living Expenses: Not very hard  Food Insecurity: No Food Insecurity (04/19/2023)   Hunger Vital Sign    Worried About Running Out of Food in the Last Year: Never true     Ran Out of Food in the Last Year: Never true  Transportation Needs: No Transportation Needs (04/19/2023)   PRAPARE - Administrator, Civil Service (Medical): No    Lack of Transportation (Non-Medical): No  Physical Activity: Insufficiently Active (04/19/2023)   Exercise Vital Sign    Days of Exercise per Week: 3 days    Minutes of Exercise per Session: 30 min  Stress: Stress Concern Present (04/19/2023)   [employer] of Occupational Health - Occupational Stress Questionnaire    Feeling of Stress : To some extent  Social Connections: Moderately Integrated (04/19/2023)   Social Connection and Isolation Panel [NHANES]    Frequency of Communication with Friends and Family: More than three times a week    Frequency of Social Gatherings with Friends and Family: Once a week    Attends Religious Services: More than 4 times per year    Active Member of [employer] or Organizations: Yes    Attends Banker Meetings: 1 to 4 times per year    Marital Status: Divorced  Recent Concern: Social Connections - Moderately Isolated (01/23/2023)   Social Connection and Isolation Panel [NHANES]    Frequency of Communication with Friends and Family: More than three times a week    Frequency of Social Gatherings with  Friends and Family: More than three times a week    Attends Religious Services: More than 4 times per year    Active Member of Clubs or Organizations: No    Attends Engineer, structural: Never    Marital Status: Divorced    Review of Systems Per HPI  Objective:  BP 122/78   Pulse 68   Temp 97.7 F (36.5 C)   Ht 5\' 7"  (1.702 m)   Wt 253 lb (114.8 kg)   SpO2 98%   BMI 39.63 kg/m      04/22/2023   11:01 AM 01/23/2023    1:03 PM 12/22/2022    2:20 PM  BP/Weight  Systolic BP 122 -- 120  Diastolic BP 78 -- 76  Wt. (Lbs) 253 253 253  BMI 39.63 kg/m2 39.63 kg/m2 39.63 kg/m2    Physical Exam Vitals and nursing note reviewed.  Constitutional:       General: He is not in acute distress.    Appearance: Normal appearance.  HENT:     Head: Normocephalic and atraumatic.  Eyes:     General:        Right eye: No discharge.        Left eye: No discharge.     Conjunctiva/sclera: Conjunctivae normal.  Cardiovascular:     Rate and Rhythm: Normal rate and regular rhythm.  Pulmonary:     Effort: Pulmonary effort is normal.     Breath sounds: Normal breath sounds. No wheezing, rhonchi or rales.  Neurological:     Mental Status: He is alert.  Psychiatric:        Mood and Affect: Mood normal.        Behavior: Behavior normal.     Lab Results  Component Value Date   WBC 7.4 06/20/2021   HGB 14.7 06/20/2021   HCT 44.2 06/20/2021   PLT 220 06/20/2021   GLUCOSE 123 (H) 08/14/2022   CHOL 81 (L) 08/14/2022   TRIG 117 08/14/2022   HDL 45 08/14/2022   LDLCALC 15 08/14/2022   ALT 18 08/14/2022   AST 23 08/14/2022   NA 141 08/14/2022   K 4.8 08/14/2022   CL 102 08/14/2022   CREATININE 0.87 08/14/2022   BUN 12 08/14/2022   CO2 23 08/14/2022   TSH 2.830 08/14/2022   INR 1.05 06/06/2013   HGBA1C 6.7 (A) 12/22/2022   MICROALBUR 80 mg/L 05/21/2022     Assessment & Plan:   Problem List Items Addressed This Visit       Cardiovascular and Mediastinum   Essential hypertension - Primary   Stable.  Continue metoprolol and losartan.      Relevant Medications   isosorbide mononitrate (IMDUR) 60 MG 24 hr tablet   Coronary artery disease involving native coronary artery with angina pectoris (HCC)   Continue current medications.  Imdur refilled today.      Relevant Medications   isosorbide mononitrate (IMDUR) 60 MG 24 hr tablet     Endocrine   Type 2 diabetes mellitus with hyperglycemia (HCC)   Stable. Continue current medications.      Relevant Orders   CMP14+EGFR   Hemoglobin A1c   Microalbumin / creatinine urine ratio     Other   Mixed hyperlipidemia   At goal. Continue current medications.       Relevant Medications    isosorbide mononitrate (IMDUR) 60 MG 24 hr tablet   Other Relevant Orders   Lipid panel   Other Visit Diagnoses  Screening for deficiency anemia       Relevant Orders   CBC     Prostate cancer screening       Relevant Orders   PSA       Meds ordered this encounter  Medications   isosorbide mononitrate (IMDUR) 60 MG 24 hr tablet    Sig: Take 1 tablet (60 mg total) by mouth daily.    Dispense:  90 tablet    Refill:  3    Follow-up:  6 months  Brazil Voytko Adriana Simas DO [facility]

## 2023-04-22 NOTE — Assessment & Plan Note (Signed)
At goal. Continue current medications.

## 2023-04-22 NOTE — Patient Instructions (Signed)
Continue your medications.  Use the thighs for the Repatha.  Labs today.  Follow up in 6 months.

## 2023-04-22 NOTE — Telephone Encounter (Signed)
Reason for CRM:    isosorbide mononitrate (IMDUR) 60 MG 24 hr tablet        The above medication was prescribed to patient today during his appointment and trying to figure out why when he gets it from his heart doctor. Patient stated during visit it was never discussed of any new medications to be given as well. Wants to know what is going on and why was prescribed medication without knowing

## 2023-04-23 ENCOUNTER — Encounter: Payer: Self-pay | Admitting: Family Medicine

## 2023-04-23 ENCOUNTER — Ambulatory Visit: Payer: Medicare Other | Admitting: Nurse Practitioner

## 2023-04-23 ENCOUNTER — Encounter: Payer: Self-pay | Admitting: Nurse Practitioner

## 2023-04-23 VITALS — BP 120/73 | HR 67 | Ht 67.0 in | Wt 256.8 lb

## 2023-04-23 DIAGNOSIS — Z7985 Long-term (current) use of injectable non-insulin antidiabetic drugs: Secondary | ICD-10-CM

## 2023-04-23 DIAGNOSIS — E782 Mixed hyperlipidemia: Secondary | ICD-10-CM | POA: Diagnosis not present

## 2023-04-23 DIAGNOSIS — Z7984 Long term (current) use of oral hypoglycemic drugs: Secondary | ICD-10-CM

## 2023-04-23 DIAGNOSIS — E559 Vitamin D deficiency, unspecified: Secondary | ICD-10-CM | POA: Diagnosis not present

## 2023-04-23 DIAGNOSIS — I10 Essential (primary) hypertension: Secondary | ICD-10-CM

## 2023-04-23 DIAGNOSIS — E1165 Type 2 diabetes mellitus with hyperglycemia: Secondary | ICD-10-CM | POA: Diagnosis not present

## 2023-04-23 LAB — LIPID PANEL
Chol/HDL Ratio: 2.2 {ratio} (ref 0.0–5.0)
Cholesterol, Total: 98 mg/dL — ABNORMAL LOW (ref 100–199)
HDL: 45 mg/dL (ref >39)
LDL Chol Calc (NIH): 30 mg/dL (ref 0–99)
Triglycerides: 128 mg/dL (ref 0–149)
VLDL Cholesterol Cal: 23 mg/dL (ref 5–40)

## 2023-04-23 LAB — CMP14+EGFR
ALT: 24 [IU]/L (ref 0–44)
AST: 26 [IU]/L (ref 0–40)
Albumin: 4.4 g/dL (ref 3.9–4.9)
Alkaline Phosphatase: 95 [IU]/L (ref 44–121)
BUN/Creatinine Ratio: 13 (ref 10–24)
BUN: 12 mg/dL (ref 8–27)
Bilirubin Total: 1.2 mg/dL (ref 0.0–1.2)
CO2: 27 mmol/L (ref 20–29)
Calcium: 9.6 mg/dL (ref 8.6–10.2)
Chloride: 99 mmol/L (ref 96–106)
Creatinine, Ser: 0.95 mg/dL (ref 0.76–1.27)
Globulin, Total: 2.5 g/dL (ref 1.5–4.5)
Glucose: 121 mg/dL — ABNORMAL HIGH (ref 70–99)
Potassium: 5 mmol/L (ref 3.5–5.2)
Sodium: 138 mmol/L (ref 134–144)
Total Protein: 6.9 g/dL (ref 6.0–8.5)
eGFR: 87 mL/min/{1.73_m2} (ref >59)

## 2023-04-23 LAB — CBC
Hematocrit: 40.9 % (ref 37.5–51.0)
Hemoglobin: 13.9 g/dL (ref 13.0–17.7)
MCH: 31.4 pg (ref 26.6–33.0)
MCHC: 34 g/dL (ref 31.5–35.7)
MCV: 92 fL (ref 79–97)
Platelets: 243 10*3/uL (ref 150–450)
RBC: 4.43 x10E6/uL (ref 4.14–5.80)
RDW: 12.7 % (ref 11.6–15.4)
WBC: 8 10*3/uL (ref 3.4–10.8)

## 2023-04-23 LAB — MICROALBUMIN / CREATININE URINE RATIO
Creatinine, Urine: 153 mg/dL
Microalb/Creat Ratio: 40 mg/g{creat} — ABNORMAL HIGH (ref 0–29)
Microalbumin, Urine: 60.6 ug/mL

## 2023-04-23 LAB — HEMOGLOBIN A1C
Est. average glucose Bld gHb Est-mCnc: 166 mg/dL
Hgb A1c MFr Bld: 7.4 % — ABNORMAL HIGH (ref 4.8–5.6)

## 2023-04-23 LAB — PSA: Prostate Specific Ag, Serum: 0.4 ng/mL (ref 0.0–4.0)

## 2023-04-23 MED ORDER — SEMAGLUTIDE (2 MG/DOSE) 8 MG/3ML ~~LOC~~ SOPN: 2.0000 mg | PEN_INJECTOR | SUBCUTANEOUS | 3 refills | Status: DC

## 2023-04-23 MED ORDER — ACCU-CHEK GUIDE TEST VI STRP: ORAL_STRIP | 12 refills | Status: DC

## 2023-04-23 NOTE — Progress Notes (Signed)
Endocrinology Follow Up Note       04/23/2023, 8:55 AM   Subjective:    Patient ID: [name], male    DOB: May 31, 1955.  [name] is being seen in follow up after being seen in consultation for management of currently uncontrolled symptomatic diabetes requested by  Tommie Sams, DO.   Past Medical History:  Diagnosis Date   Arthritis    CAD (coronary artery disease) 2000   a. History of MI ~2000 s/p PCI. b. CABG in 2006 (LIMA-LAD, RIMA-acute marginal, SVG-DX, SVG-RCA). c. NSTEMI 05/2013:severe stenoses of the SVG-PDA and SVG-diagonal, both treated successfully with PCI (DES x2).   Diabetes (HCC)    GERD (gastroesophageal reflux disease)    Gout    History of hypertension    Hydrocele 2012   Hyperlipidemia    Hypertension    Myocardial infarction [facility]) 2014   x3   Obesity    S/P coronary angioplasty 11/10/17 11/11/2017    Past Surgical History:  Procedure Laterality Date   BIOPSY  09/17/2021   Procedure: BIOPSY;  Surgeon: Dolores Frame, MD;  Location: [facility];  Service: Gastroenterology;;   CATARACT EXTRACTION W/PHACO Left 05/17/2012   Procedure: CATARACT EXTRACTION PHACO AND INTRAOCULAR LENS PLACEMENT (IOC);  Surgeon: Gemma Payor, MD;  Location: [facility];  Service: Ophthalmology;  Laterality: Left;  CDE:  0.76   CATARACT EXTRACTION W/PHACO Right 08/26/2012   Procedure: CATARACT EXTRACTION PHACO AND INTRAOCULAR LENS PLACEMENT (IOC);  Surgeon: Gemma Payor, MD;  Location: [facility];  Service: Ophthalmology;  Laterality: Right;  CDE: 1.13   COLONOSCOPY WITH PROPOFOL N/A 09/17/2021   Procedure: COLONOSCOPY WITH PROPOFOL;  Surgeon: Dolores Frame, MD;  Location: [facility];  Service: Gastroenterology;  Laterality: N/A;  730   CORONARY ANGIOPLASTY  2000   stents x2   CORONARY ARTERY BYPASS GRAFT  2006   4 vessels   Coronary artery bypass grafting  May 2006   Four-vessel    CORONARY BALLOON ANGIOPLASTY N/A 11/10/2017   Procedure: CORONARY BALLOON ANGIOPLASTY;  Surgeon: Tonny Bollman, MD;  Location: [facility] INVASIVE CV LAB;  Service: Cardiovascular;  Laterality: N/A;   FOOT FRACTURE SURGERY     right   HEMOSTASIS CLIP PLACEMENT  09/17/2021   Procedure: HEMOSTASIS CLIP PLACEMENT;  Surgeon: Dolores Frame, MD;  [facility];  Service: Gastroenterology;;   LEFT HEART CATH AND CORS/GRAFTS ANGIOGRAPHY N/A 11/10/2017   Procedure: LEFT HEART CATH AND CORS/GRAFTS ANGIOGRAPHY;  Surgeon: Tonny Bollman, MD;  Location: [facility] INVASIVE CV LAB;  Service: Cardiovascular;  Laterality: N/A;   LEFT HEART CATHETERIZATION WITH CORONARY/GRAFT ANGIOGRAM N/A 05/05/2011   Procedure: LEFT HEART CATHETERIZATION WITH Isabel Caprice;  Surgeon: Wendall Stade, MD;  Location: [facility] CATH LAB;  Service: Cardiovascular;  Laterality: N/A;   LEFT HEART CATHETERIZATION WITH CORONARY/GRAFT ANGIOGRAM N/A 06/06/2013   Procedure: LEFT HEART CATHETERIZATION WITH Isabel Caprice;  Surgeon: Micheline Chapman, MD;  Location: [facility] CATH LAB;  Service: Cardiovascular;  Laterality: N/A;   MASS EXCISION Right 05/14/2016   Procedure: EXCISION MASS RIGHT ELBOW;  Surgeon: Vickki Hearing, MD;  Location: [facility];  Service: Orthopedics;  Laterality: Right;   PILONIDAL CYST  EXCISION     POLYPECTOMY  09/17/2021   Procedure: POLYPECTOMY;  Surgeon: Marguerita Merles, Reuel Boom, MD;  [facility];  Service: Gastroenterology;;   TONSILLECTOMY      Social History   Socioeconomic History   Marital status: Divorced    Spouse name: Not on file   Number of children: Not on file   Years of education: Not on file   Highest education level: Some college, no degree  Occupational History   Not on file  Tobacco Use   Smoking status: Never   Smokeless tobacco: Never  Vaping Use   Vaping status: Never Used  Substance and Sexual Activity   Alcohol use: No   Drug use: No   Sexual activity:  Yes    Birth control/protection: None  Other Topics Concern   Not on file  Social History Narrative   Not on file   Social Drivers of Health   Financial Resource Strain: Low Risk  (04/19/2023)   Overall Financial Resource Strain (CARDIA)    Difficulty of Paying Living Expenses: Not very hard  Food Insecurity: No Food Insecurity (04/19/2023)   Hunger Vital Sign    Worried About Running Out of Food in the Last Year: Never true    Ran Out of Food in the Last Year: Never true  Transportation Needs: No Transportation Needs (04/19/2023)   PRAPARE - Administrator, Civil Service (Medical): No    Lack of Transportation (Non-Medical): No  Physical Activity: Insufficiently Active (04/19/2023)   Exercise Vital Sign    Days of Exercise per Week: 3 days    Minutes of Exercise per Session: 30 min  Stress: Stress Concern Present (04/19/2023)   [employer] of Occupational Health - Occupational Stress Questionnaire    Feeling of Stress : To some extent  Social Connections: Moderately Integrated (04/19/2023)   Social Connection and Isolation Panel [NHANES]    Frequency of Communication with Friends and Family: More than three times a week    Frequency of Social Gatherings with Friends and Family: Once a week    Attends Religious Services: More than 4 times per year    Active Member of [employer] or Organizations: Yes    Attends Banker Meetings: 1 to 4 times per year    Marital Status: Divorced  Recent Concern: Social Connections - Moderately Isolated (01/23/2023)   Social Connection and Isolation Panel [NHANES]    Frequency of Communication with Friends and Family: More than three times a week    Frequency of Social Gatherings with Friends and Family: More than three times a week    Attends Religious Services: More than 4 times per year    Active Member of [employer] or Organizations: No    Attends Banker Meetings: Never    Marital Status: Divorced    Family  History  Problem Relation Age of Onset   Cancer Mother    Cervical cancer Mother    Cancer Father    Throat cancer Father    Thyroid disease Son    Healthy Daughter    Healthy Son     Outpatient Encounter Medications as of 04/23/2023  Medication Sig   aspirin EC 81 MG tablet Take 81 mg by mouth daily.   blood glucose meter kit and supplies KIT 1 each by Does not apply route daily. Dispense based on patient and insurance preference. Use to check blood glucose daily as directed.   clopidogrel (PLAVIX) 75 MG tablet TAKE  1 TABLET BY MOUTH EVERY DAY   Evolocumab (REPATHA SURECLICK) 140 MG/ML SOAJ Inject 140 mg into the skin every 14 (fourteen) days.   glucose blood (ACCU-CHEK GUIDE TEST) test strip Use as instructed to monitor glucose twice daily   isosorbide mononitrate (IMDUR) 60 MG 24 hr tablet Take 1 tablet (60 mg total) by mouth daily.   Lancets (ONETOUCH DELICA PLUS LANCET33G) MISC Use to monitor glucose twice daily.   losartan (COZAAR) 25 MG tablet TAKE 1 TABLET (25 MG TOTAL) BY MOUTH DAILY.   metFORMIN (GLUCOPHAGE-XR) 500 MG 24 hr tablet TAKE 2 TABLETS BY MOUTH EVERY DAY WITH BREAKFAST   metoprolol tartrate (LOPRESSOR) 25 MG tablet TAKE 1 TABLET BY MOUTH TWICE A DAY   nitroGLYCERIN (NITROSTAT) 0.4 MG SL tablet PLACE 1 TABLET UNDER THE TONGUE EVERY 5 MINUTES AS NEEDED FOR CHEST PAIN.   rosuvastatin (CRESTOR) 40 MG tablet TAKE 1 TABLET BY MOUTH EVERY DAY   [DISCONTINUED] glucose blood (ACCU-CHEK GUIDE) test strip 1 each by Other route as needed for other. Use as instructed   [DISCONTINUED] glucose blood (ONETOUCH VERIO) test strip Use to check BG ONCE daily as instructed. E11.65   [DISCONTINUED] Semaglutide, 2 MG/DOSE, 8 MG/3ML SOPN Inject 2 mg as directed once a week.   Semaglutide, 2 MG/DOSE, 8 MG/3ML SOPN Inject 2 mg as directed once a week.   [DISCONTINUED] isosorbide mononitrate (IMDUR) 60 MG 24 hr tablet Take 1 tablet (60 mg total) by mouth daily.   No facility-administered  encounter medications on file as of 04/23/2023.    ALLERGIES: Allergies  Allergen Reactions   Hydrocodone Nausea Only    VACCINATION STATUS: Immunization History  Administered Date(s) Administered   Fluad Quad(high Dose 65+) 03/23/2020   Influenza, High Dose Seasonal PF 03/12/2021   Influenza, Quadrivalent, Recombinant, Inj, Pf 01/25/2019   Influenza,trivalent, recombinat, inj, PF 01/09/2017   Influenza-Unspecified 01/22/2018   Moderna Sars-Covid-2 Vaccination 05/20/2019, 06/21/2019, 03/23/2020   PNEUMOCOCCAL CONJUGATE-20 06/20/2021   Tdap 03/24/2014   Zoster Recombinant(Shingrix) 10/16/2018, 01/25/2019    Diabetes He presents for his follow-up diabetic visit. He has type 2 diabetes mellitus. Onset time: Recently diagnosed at age 61. His disease course has been stable. There are no hypoglycemic associated symptoms. Pertinent negatives for diabetes include no blurred vision, no fatigue, no polydipsia, no polyuria and no weight loss. There are no hypoglycemic complications. Symptoms are stable. Diabetic complications include heart disease and nephropathy. Risk factors for coronary artery disease include diabetes mellitus, dyslipidemia, family history, obesity, male sex, hypertension and sedentary lifestyle. Current diabetic treatment includes oral agent (monotherapy) (and Ozempic). He is compliant with treatment most of the time. His weight is fluctuating minimally. He is following a generally healthy diet. When asked about meal planning, he reported none. He has not had a previous visit with a dietitian. He never participates in exercise. His home blood glucose trend is fluctuating minimally. His breakfast blood glucose range is generally 140-180 mg/dl. (He presents today with his meter and logs showing slightly above target fasting glycemic profile.  His A1c was drawn yesterday by PCP and was 7.4%, increasing from last visit of 6.7%.  He admits he over-indulged during the holiday season but is  motivated to get back on track without adding more medication.  He denies any hypoglycemia.) An ACE inhibitor/angiotensin II receptor blocker is not being taken. He sees a podiatrist.Eye exam is current.  Hypertension This is a chronic problem. The current episode started more than 1 year ago. The problem has been resolved  since onset. The problem is controlled. Pertinent negatives include no blurred vision. There are no associated agents to hypertension. Risk factors for coronary artery disease include diabetes mellitus, dyslipidemia, family history, obesity and male gender. Past treatments include beta blockers. The current treatment provides mild improvement. Compliance problems include diet and exercise.  Hypertensive end-organ damage includes kidney disease and CAD/MI. Identifiable causes of hypertension include chronic renal disease.  Hyperlipidemia This is a chronic problem. The current episode started more than 1 year ago. The problem is controlled. Recent lipid tests were reviewed and are normal. Exacerbating diseases include chronic renal disease and diabetes. Factors aggravating his hyperlipidemia include fatty foods and beta blockers. Current antihyperlipidemic treatment includes statins. The current treatment provides moderate improvement of lipids. Compliance problems include adherence to diet and adherence to exercise.  Risk factors for coronary artery disease include diabetes mellitus, dyslipidemia, family history, obesity, male sex and hypertension.     Review of systems  Constitutional: + minimally fluctuating body weight,  current Body mass index is 40.22 kg/m. , no fatigue, no subjective hyperthermia, no subjective hypothermia Eyes: no blurry vision, no xerophthalmia ENT: no sore throat, no nodules palpated in throat, no dysphagia/odynophagia, no hoarseness Cardiovascular: no chest pain, no shortness of breath, no palpitations, no leg swelling Respiratory: no cough, no shortness of  breath Gastrointestinal: no nausea/vomiting/diarrhea, + alternating constipation/diarrhea Musculoskeletal: no muscle/joint aches Skin: no rashes, no hyperemia Neurological: no tremors, no numbness, no tingling, no dizziness Psychiatric: no depression, no anxiety  Objective:     BP 120/73 (BP Location: Right Arm, Patient Position: Sitting, Cuff Size: Large)   Pulse 67   Ht 5\' 7"  (1.702 m)   Wt 256 lb 12.8 oz (116.5 kg)   BMI 40.22 kg/m   Wt Readings from Last 3 Encounters:  04/23/23 256 lb 12.8 oz (116.5 kg)  04/22/23 253 lb (114.8 kg)  01/23/23 253 lb (114.8 kg)     BP Readings from Last 3 Encounters:  04/23/23 120/73  04/22/23 122/78  12/22/22 120/76       Physical Exam- Limited  Constitutional:  Body mass index is 40.22 kg/m. , not in acute distress, normal state of mind Eyes:  EOMI, no exophthalmos Musculoskeletal: no gross deformities, strength intact in all four extremities, no gross restriction of joint movements Skin:  no rashes, no hyperemia Neurological: no tremor with outstretched hands   Diabetic Foot Exam - Simple   No data filed    CMP ( most recent) CMP     Component Value Date/Time   NA 138 04/22/2023 1145   K 5.0 04/22/2023 1145   CL 99 04/22/2023 1145   CO2 27 04/22/2023 1145   GLUCOSE 121 (H) 04/22/2023 1145   GLUCOSE 140 (H) 09/13/2021 0919   BUN 12 04/22/2023 1145   CREATININE 0.95 04/22/2023 1145   CREATININE 1.00 05/07/2015 0834   CALCIUM 9.6 04/22/2023 1145   PROT 6.9 04/22/2023 1145   ALBUMIN 4.4 04/22/2023 1145   AST 26 04/22/2023 1145   ALT 24 04/22/2023 1145   ALKPHOS 95 04/22/2023 1145   BILITOT 1.2 04/22/2023 1145   GFRNONAA >60 09/13/2021 0919   GFRAA 88 11/16/2018 1027     Diabetic Labs (most recent): Lab Results  Component Value Date   HGBA1C 7.4 (H) 04/22/2023   HGBA1C 6.7 (A) 12/22/2022   HGBA1C 6.3 (A) 08/21/2022   MICROALBUR 80 mg/L 05/21/2022     Lipid Panel ( most recent) Lipid Panel     Component  Value Date/Time  CHOL 98 (L) 04/22/2023 1145   TRIG 128 04/22/2023 1145   HDL 45 04/22/2023 1145   CHOLHDL 2.2 04/22/2023 1145   CHOLHDL 3.4 05/07/2015 0834   VLDL 16 05/07/2015 0834   LDLCALC 30 04/22/2023 1145   LABVLDL 23 04/22/2023 1145      Lab Results  Component Value Date   TSH 2.830 08/14/2022   TSH 2.270 06/27/2020   TSH 1.478 05/03/2011   FREET4 1.29 08/14/2022           Assessment & Plan:   1) Type 2 diabetes mellitus with hyperglycemia, without long-term current use of insulin (HCC)  He presents today with his meter and logs showing slightly above target fasting glycemic profile.  His A1c was drawn yesterday by PCP and was 7.4%, increasing from last visit of 6.7%.  He admits he over-indulged during the holiday season but is motivated to get back on track without adding more medication.  He denies any hypoglycemia.  - [name] has currently uncontrolled symptomatic type 2 DM since 68 years of age.   -Recent labs reviewed.  - I had a long discussion with him about the progressive nature of diabetes and the pathology behind its complications. -his diabetes is complicated by CAD with MI and mild CKD and he remains at a high risk for more acute and chronic complications which include CAD, CVA, CKD, retinopathy, and neuropathy. These are all discussed in detail with him.  The following Lifestyle Medicine recommendations according to American College of Lifestyle Medicine [facility]) were discussed and offered to patient and he agrees to start the journey:  A. Whole Foods, Plant-based plate comprising of fruits and vegetables, plant-based proteins, whole-grain carbohydrates was discussed in detail with the patient.   A list for source of those nutrients were also provided to the patient.  Patient will use only water or unsweetened tea for hydration. B.  The need to stay away from risky substances including alcohol, smoking; obtaining 7 to 9 hours of restorative sleep,  at least 150 minutes of moderate intensity exercise weekly, the importance of healthy social connections,  and stress reduction techniques were discussed. C.  A full color page of  Calorie density of various food groups per pound showing examples of each food groups was provided to the patient.  - Nutritional counseling repeated at each appointment due to patients tendency to fall back in to old habits.  - The patient admits there is a room for improvement in their diet and drink choices. -  Suggestion is made for the patient to avoid simple carbohydrates from their diet including Cakes, Sweet Desserts / Pastries, Ice Cream, Soda (diet and regular), Sweet Tea, Candies, Chips, Cookies, Sweet Pastries, Store Bought Juices, Alcohol in Excess of 1-2 drinks a day, Artificial Sweeteners, Coffee Creamer, and "Sugar-free" Products. This will help patient to have stable blood glucose profile and potentially avoid unintended weight gain.   - I encouraged the patient to switch to unprocessed or minimally processed complex starch and increased protein intake (animal or plant source), fruits, and vegetables.   - Patient is advised to stick to a routine mealtimes to eat 3 meals a day and avoid unnecessary snacks (to snack only to correct hypoglycemia).  - I have approached him with the following individualized plan to manage his diabetes and patient agrees:   - He is advised to continue Metformin 1000 mg ER daily with breakfast and Ozempic 2 mg SQ weekly.  He was given PAP forms to  fill out but never brought them back as his last script was free. Given his history of MI x 3, he definitely needs GLP1 to prevent another occurrence.   -he is encouraged to continue monitoring blood glucose daily, before breakfast, and to call the clinic if he has readings less than 70 or above 300 for 3 tests in a row.  He is on safe regimen and doesn't need to monitor glucose every day but prefers to continue to prevent losing  control.  - Adjustment parameters are given to him for hypo and hyperglycemia in writing.  - Specific targets for  A1c; LDL, HDL, and Triglycerides were discussed with the patient.  2) Blood Pressure /Hypertension:  his blood pressure is controlled to target.   he is advised to continue his current medications as prescribed by PCP and cardiology.  3) Lipids/Hyperlipidemia:    Review of his recent lipid panel from 04/22/23 showed controlled LDL at 30 .  he is advised to continue Crestor 40 mg daily at bedtime and Repatha per cardiology.  Side effects and precautions discussed with him.  4)  Weight/Diet:  his Body mass index is 40.22 kg/m.  -  clearly complicating his diabetes care.   he is a candidate for weight loss. I discussed with him the fact that loss of 5 - 10% of his  current body weight will have the most impact on his diabetes management.  Exercise, and detailed carbohydrates information provided  -  detailed on discharge instructions.  5) Chronic Care/Health Maintenance: -he is not on ACEI/ARB and is on Statin medications and is encouraged to initiate and continue to follow up with Ophthalmology, Dentist, Podiatrist at least yearly or according to recommendations, and advised to stay away from smoking. I have recommended yearly flu vaccine and pneumonia vaccine at least every 5 years; moderate intensity exercise for up to 150 minutes weekly; and sleep for at least 7 hours a day.  - he is advised to maintain close follow up with Tommie Sams, DO for primary care needs, as well as his other providers for optimal and coordinated care.     I spent  39  minutes in the care of the patient today including review of labs from CMP, Lipids, Thyroid Function, Hematology (current and previous including abstractions from other facilities); face-to-face time discussing  his blood glucose readings/logs, discussing hypoglycemia and hyperglycemia episodes and symptoms, medications doses, his options  of short and long term treatment based on the latest standards of care / guidelines;  discussion about incorporating lifestyle medicine;  and documenting the encounter. Risk reduction counseling performed per USPSTF guidelines to reduce obesity and cardiovascular risk factors.     Please refer to Patient Instructions for Blood Glucose Monitoring and Insulin/Medications Dosing Guide"  in media tab for additional information. Please  also refer to " Patient Self Inventory" in the Media  tab for reviewed elements of pertinent patient history.  [name] participated in the discussions, expressed understanding, and voiced agreement with the above plans.  All questions were answered to his satisfaction. he is encouraged to contact clinic should he have any questions or concerns prior to his return visit.     Follow up plan: - Return in about 4 months (around 08/21/2023) for Diabetes F/U with A1c in office, No previsit labs.   Ronny Bacon, [facility] [address] [place], [place] [address] Phone: [phone] Fax: [phone]  04/23/2023, 8:55 AM

## 2023-05-07 ENCOUNTER — Other Ambulatory Visit: Payer: Self-pay | Admitting: Family Medicine

## 2023-05-07 NOTE — Telephone Encounter (Signed)
Copied from CRM [id number]. Topic: Clinical - Medication Refill >> May 07, 2023 10:04 AM Eunice Blase wrote: Most Recent Primary Care Visit:  Provider: Tommie Sams  Department: [facility]  Visit Type: OFFICE VISIT  Date: 04/22/2023  Medication: Ozempic  Has the patient contacted their pharmacy? Yes (Agent: If no, request that the patient contact the pharmacy for the refill. If patient does not wish to contact the pharmacy document the reason why and proceed with request.) (Agent: If yes, when and what did the pharmacy advise?)Pharmacy state has not been approved by office  Is this the correct pharmacy for this prescription? Yes If no, delete pharmacy and type the correct one.  This is the patient's preferred pharmacy:    [id number] - [place], [place] - [address] [place] [address] Phone: [phone] Fax: [phone]   Has the prescription been filled recently? Yes  Is the patient out of the medication? Yes  Has the patient been seen for an appointment in the last year OR does the patient have an upcoming appointment? Yes  Can we respond through MyChart? Yes  Agent: Please be advised that Rx refills may take up to 3 business days. We ask that you follow-up with your pharmacy.

## 2023-05-11 NOTE — Telephone Encounter (Signed)
Pt states this Rx is requiring a PA

## 2023-05-19 DIAGNOSIS — E119 Type 2 diabetes mellitus without complications: Secondary | ICD-10-CM | POA: Diagnosis not present

## 2023-05-19 LAB — HM DIABETES EYE EXAM

## 2023-05-27 ENCOUNTER — Telehealth: Payer: Self-pay

## 2023-05-27 ENCOUNTER — Other Ambulatory Visit (HOSPITAL_COMMUNITY): Payer: Self-pay

## 2023-05-27 NOTE — Telephone Encounter (Signed)
 Pharmacy Patient Advocate Encounter   Received notification from CoverMyMeds that prior authorization for Ozempic is required/requested.   Insurance verification completed.   The patient is insured through [facility] .   Per test claim: PA required; PA submitted to above mentioned insurance via CoverMyMeds Key/confirmation #/EOC  [id number] Status is pending

## 2023-06-02 ENCOUNTER — Other Ambulatory Visit: Payer: Self-pay | Admitting: Cardiovascular Disease

## 2023-06-09 ENCOUNTER — Other Ambulatory Visit (HOSPITAL_COMMUNITY): Payer: Self-pay

## 2023-06-09 NOTE — Telephone Encounter (Signed)
 Pharmacy Patient Advocate Encounter  Received notification from [facility] that Prior Authorization for Ozempic (2 MG/DOSE) 8MG /3ML pen-injectors has been CANCELLED due to   PA #/Case ID/Reference #: [id number]

## 2023-06-19 ENCOUNTER — Other Ambulatory Visit: Payer: Self-pay | Admitting: Cardiovascular Disease

## 2023-07-21 ENCOUNTER — Telehealth: Payer: Self-pay | Admitting: Pharmacy Technician

## 2023-07-21 NOTE — Telephone Encounter (Signed)
 Pharmacy Patient Advocate Encounter  Received notification from [employer] that Prior Authorization for repatha  has been APPROVED from 07/21/23 to 01/20/24. Spoke to pharmacy to process.Copay is $47.00.    PA #/Case ID/Reference #:  [id number]

## 2023-07-21 NOTE — Telephone Encounter (Signed)
 Pharmacy Patient Advocate Encounter   Received notification from CoverMyMeds that prior authorization for repatha  is required/requested.   Insurance verification completed.   The patient is insured through [facility] .   Per test claim: PA required; PA submitted to above mentioned insurance via CoverMyMeds Key/confirmation #/EOC [id number] Status is pending   Pt has been on repatha  so that's why his labs are so good and was on praluent  before that

## 2023-08-18 NOTE — Progress Notes (Unsigned)
 Cardiology Office Note    Date:  08/19/2023  ID:  [name], DOB 03/03/1956, MRN [id number] PCP:  Nathan Ast, DO  Cardiologist:  Arnoldo Lapping, MD  Electrophysiologist:  None   Chief Complaint: Follow up for CAD   History of Present Illness: .    [name] is a 68 y.o. male with visit-pertinent history of coronary artery disease s/p CABG in 2006 with LIMA ot LAD, free RIMA to OM, SVG to Dx and RCA, NSTEMI in 2015 2 with DES to S-Dx and DES to R-RCA, cardiac cath in 2019 indicated S-Dx and S-RCA were both occluded, had patient PCI of POBA to PL CX, also has history of hypertension, hyperlipidemia and GERD.  [name] was last seen in clinic on 08/12/2022 by Dr. Arlester Ladd, he remained stable from a cardiac standpoint.  He was walking 2 to 3 miles daily without exertional symptoms, he did note some chest discomfort when he pushes himself too hard but denied any further concerning symptoms.  Today he presents for follow up. He reports that he is doing well overall.  He denies any chest pain, shortness of breath, lower extremity edema, orthopnea or PND.  Patient notes that he is not regularly exercising although is walking about 2 miles a few days a week and tolerating well.  He denies any palpitations, presyncope or syncope.  He reports that he is tolerating all medications, he has no cardiac concerns or complaints today.  Labwork independently reviewed: 04/22/2023: Sodium 138, potassium 5.0, creatinine 0.95, [name] 26, ALT 24, hemoglobin 13.9, hematocrit 40.9 ROS: .   Today he denies chest pain, shortness of breath, lower extremity edema, fatigue, palpitations, melena, hematuria, hemoptysis, diaphoresis, weakness, presyncope, syncope, orthopnea, and PND.  All other systems are reviewed and otherwise negative. Studies Reviewed: Aaron Aas   EKG:  EKG is ordered today, personally reviewed, demonstrating  EKG Interpretation Date/Time:  Wednesday Aug 19 2023 11:03:55 EDT Ventricular Rate:   70 PR Interval:  280 QRS Duration:  88 QT Interval:  400 QTC Calculation: 432 R Axis:   70  Text Interpretation: Sinus rhythm with 1st degree A-V block Nonspecific ST abnormality Confirmed by [name] [phone]) on 08/19/2023 5:34:06 PM   CV Studies: Cardiac studies reviewed are outlined and summarized above. Otherwise please see EMR for full report. Cardiac Studies & Procedures   ______________________________________________________________________________________________ CARDIAC CATHETERIZATION  CARDIAC CATHETERIZATION 11/10/2017  Conclusion 1.  Severe native multivessel coronary artery disease with total occlusion of the RCA and total occlusion of the LAD, total occlusion of the first OM and severe stenosis of the proximal circumflex 2.  Status post aortocoronary bypass surgery with continued patency of the LIMA to LAD into a diffusely diseased mid and distal LAD, continued patency of the free RIMA to OM, and total occlusion of the saphenous vein graft to diagonal and RCA branches 3.  Successful balloon angioplasty of the proximal circumflex, unable to deliver a stent, proximal circumflex stenosis reduced from 75% to 25% 4.  Normal LVEDP  Recommendations: Ongoing medical therapy and will increase antianginal program as tolerated.  Likely not a candidate for redo CABG because of poor conduits noted at initial surgery, prior use of both the RIMA and LIMA, and poor distal targets.  Recommend dual antiplatelet therapy with Aspirin  81mg  daily and Clopidogrel  75mg  daily long-term (beyond 12 months) because of extensive CAD, prior CABG, multiple PCI procedures.  Findings Coronary Findings Diagnostic  Dominance: Right  Left Anterior Descending Prox LAD lesion is 100%  stenosed. Mid LAD lesion is 80% stenosed. Dist LAD lesion is 90% stenosed. small, diffusely diseased vessel through the mid/distal portions.  Left Circumflex Ost Cx to Prox Cx lesion is 75% stenosed. The lesion is  eccentric.  First Obtuse Marginal Branch Ost 1st Mrg lesion is 100% stenosed.  Right Coronary Artery Mid RCA lesion is 100% stenosed.  First Right Posterolateral Branch Collaterals 1st RPL filled by collaterals from Dist Cx.  LIMA LIMA Graft To Mid LAD LIMA. Patent LIMA-LAD. Severe diffuse distal LAD stenosis.  Graft To Dist RCA SVG-RCA occluded in proximal graft within stented segment Origin to Prox Graft lesion is 100% stenosed. The lesion was previously treated using a drug eluting stent over 2 years ago.  Graft To Ost 1st Diag Origin to Prox Graft lesion is 100% stenosed. proximal body of SVG-diagonal 100% occluded Prox Graft to Mid Graft lesion is 100% stenosed. The lesion was previously treated using a drug eluting stent over 2 years ago.  Sequential RIMA Graft To Ost LAD, Ost 1st Mrg RIMA. free RIMA-OM1 is widely patent  Intervention  Ost Cx to Prox Cx lesion Angioplasty CATH VISTA GUIDE 6FR XBLAD3.5 guide catheter was inserted. WIRE COUGAR XT STRL 190CM guidewire used to cross lesion. Balloon angioplasty was performed using a BALLOON SAPPHIRE [place] 2.5X12. Maximum pressure: 14 atm. Heparin  is used for anticoagulation.  Initially a 5 [place] EBU 3.5 cm guide is used.  A cougar is advanced across the lesion.  There is steep angulation from the lesion into the mid circumflex and this made wiring the lesion difficult.  The lesion is predilated with a 2.5 x 15 mm balloon.  I attempted to stent the lesion with a 3.0 x 18 mm resolute DES but this would not cross the lesion because of acute angulation.  Guide catheter and wire position was lost.  This was all changed out for a 6 [place] XB LAD 3.5 cm guide catheter.  The lesion is really wired with a cougar wire.  The lesion still could not be crossed with a stent.  The lesion is then redilated with a 2.5 x 12 mm noncompliant balloon to 14 atm.  Even with that, the lesion could not be crossed with the stent in guiding wire position were  again lost.  Final angiography demonstrated an acceptable balloon angioplasty result with 20 to 25% residual stenosis and TIMI-3 flow.  The patient is chest pain-free throughout.  His ACT was therapeutic throughout greater than 250 seconds. Post-Intervention Lesion Assessment The intervention was successful. Pre-interventional TIMI flow is 3. Post-intervention TIMI flow is 3. No complications occurred at this lesion. There is a 25% residual stenosis post intervention.   STRESS TESTS  MYOCARDIAL PERFUSION IMAGING 06/23/2019  Narrative  The left ventricular ejection fraction is normal (55-65%).  Nuclear stress EF: 60%.  There was no ST segment deviation noted during stress.  Findings consistent with ischemia.  This is an intermediate risk study.  No prior study for comparison.  Ischemia in mid anterior wall noted in both supine and upright stress images.            ______________________________________________________________________________________________       Current Reported Medications:.    Current Meds  Medication Sig   aspirin  EC 81 MG tablet Take 81 mg by mouth daily.   blood glucose meter kit and supplies KIT 1 each by Does not apply route daily. Dispense based on patient and insurance preference. Use to check blood glucose daily as directed.   Evolocumab  (  REPATHA  SURECLICK) 140 MG/ML SOAJ Inject 140 mg into the skin every 14 (fourteen) days.   glucose blood (ACCU-CHEK GUIDE TEST) test strip Use as instructed to monitor glucose twice daily   Lancets (ONETOUCH DELICA PLUS LANCET33G) MISC Use to monitor glucose twice daily.   losartan  (COZAAR ) 25 MG tablet TAKE 1 TABLET (25 MG TOTAL) BY MOUTH DAILY.   metFORMIN  (GLUCOPHAGE -XR) 500 MG 24 hr tablet TAKE 2 TABLETS BY MOUTH EVERY DAY WITH BREAKFAST   nitroGLYCERIN  (NITROSTAT ) 0.4 MG SL tablet PLACE 1 TABLET UNDER THE TONGUE EVERY 5 MINUTES AS NEEDED FOR CHEST PAIN.   Semaglutide , 2 MG/DOSE, 8 MG/3ML SOPN Inject 2 mg as  directed once a week.   [DISCONTINUED] clopidogrel  (PLAVIX ) 75 MG tablet TAKE 1 TABLET BY MOUTH EVERY DAY   [DISCONTINUED] isosorbide  mononitrate (IMDUR ) 60 MG 24 hr tablet Take 1 tablet (60 mg total) by mouth daily.   [DISCONTINUED] metoprolol  tartrate (LOPRESSOR ) 25 MG tablet TAKE 1 TABLET BY MOUTH TWICE A DAY   [DISCONTINUED] rosuvastatin  (CRESTOR ) 40 MG tablet TAKE 1 TABLET BY MOUTH EVERY DAY    Physical Exam:    VS:  BP 134/78   Pulse 70   Ht 5\' 7"  (1.702 m)   Wt 254 lb 12.8 oz (115.6 kg)   SpO2 96%   BMI 39.91 kg/m    Wt Readings from Last 3 Encounters:  08/19/23 254 lb 12.8 oz (115.6 kg)  04/23/23 256 lb 12.8 oz (116.5 kg)  04/22/23 253 lb (114.8 kg)    GEN: Well nourished, well developed in no acute distress NECK: No JVD; No carotid bruits CARDIAC: RRR, no murmurs, rubs, gallops RESPIRATORY:  Clear to auscultation without rales, wheezing or rhonchi  ABDOMEN: Soft, non-tender, non-distended EXTREMITIES:  No edema; No acute deformity     Asessement and Plan:.    CAD: History of CABG in 2006 followed by MI in 2015 treated with stent to the vein graft to the diagonal and drug-eluting stent to the vein graft to the RCA.  He underwent balloon angioplasty to the native LCx in 2019.  At that time his graft the diagonal if and vein graft RCA were both occluded. Stable with no anginal symptoms. No indication for ischemic evaluation.  Heart healthy diet and regular cardiovascular exercise encouraged.  Continue aspirin  81 mg daily, Plavix  75 mg daily, Imdur  60 mg daily, losartan  25 mg daily and Crestor  40 mg daily.  HTN: Blood pressure today 134/78, he reports that at home his blood pressure is very well-controlled and consistently less than 130/80.  Encouraged patient to continue monitoring at home and notify the office if consistently elevated of 130/80.  Continue current antihypertensive regimen.  HLD: Last lipid profile on 04/13/2023 indicated total cholesterol 98, HDL 45,  triglycerides 128 and LDL 30.  Continue Repatha  and rosuvastatin .  DM type II: Last hemoglobin A1c 7.4 on 04/22/2023.  Monitor managed per Endocrinology.    Disposition: F/u with Dr. Arlester Ladd in one year or sooner if needed.   Signed, Alyxandria Wentz D Shakemia Madera, NP

## 2023-08-18 NOTE — Progress Notes (Deleted)
  Electrophysiology Office Note:   Date:  08/18/2023  ID:  [name], DOB 27-Aug-1955, MRN [id number]  Primary Cardiologist: Arnoldo Lapping, MD Electrophysiologist: None  {Click to update primary MD,subspecialty MD or APP then REFRESH:1}    History of Present Illness:   [name] is a 68 y.o. male with h/o *** seen today for {VISITTYPE:28148}  Review of systems complete and found to be negative unless listed in HPI.   EP Information / Studies Reviewed:    {EKGtoday:28818}       Arrhythmia/Device History No specialty comments available.   Physical Exam:   VS:  There were no vitals taken for this visit.   Wt Readings from Last 3 Encounters:  04/23/23 256 lb 12.8 oz (116.5 kg)  04/22/23 253 lb (114.8 kg)  01/23/23 253 lb (114.8 kg)     GEN: No acute distress NECK: No JVD; No carotid bruits CARDIAC: {EPRHYTHM:28826}, no murmurs, rubs, gallops RESPIRATORY:  Clear to auscultation without rales, wheezing or rhonchi  ABDOMEN: Soft, non-tender, non-distended EXTREMITIES:  {EDEMA LEVEL:28147::"No"} edema; No deformity   ASSESSMENT AND PLAN:   ***  {Click here to Review PMH, Prob List, Meds, Allergies, SHx, FHx  :1}   Follow up with {YQMVH:84696} {EPFOLLOW EX:52841}  Signed, Tylene Galla, PA-C

## 2023-08-19 ENCOUNTER — Ambulatory Visit: Attending: Cardiology | Admitting: Cardiology

## 2023-08-19 ENCOUNTER — Encounter: Payer: Self-pay | Admitting: Cardiology

## 2023-08-19 ENCOUNTER — Ambulatory Visit: Admitting: Student

## 2023-08-19 VITALS — BP 134/78 | HR 70 | Ht 67.0 in | Wt 254.8 lb

## 2023-08-19 DIAGNOSIS — I25119 Atherosclerotic heart disease of native coronary artery with unspecified angina pectoris: Secondary | ICD-10-CM

## 2023-08-19 DIAGNOSIS — E1165 Type 2 diabetes mellitus with hyperglycemia: Secondary | ICD-10-CM | POA: Diagnosis not present

## 2023-08-19 DIAGNOSIS — E782 Mixed hyperlipidemia: Secondary | ICD-10-CM

## 2023-08-19 DIAGNOSIS — I10 Essential (primary) hypertension: Secondary | ICD-10-CM | POA: Diagnosis not present

## 2023-08-19 MED ORDER — ROSUVASTATIN CALCIUM 40 MG PO TABS: 40.0000 mg | ORAL_TABLET | Freq: Every day | ORAL | 3 refills | Status: AC

## 2023-08-19 MED ORDER — ISOSORBIDE MONONITRATE ER 60 MG PO TB24: 60.0000 mg | ORAL_TABLET | Freq: Every day | ORAL | 3 refills | Status: AC

## 2023-08-19 MED ORDER — CLOPIDOGREL BISULFATE 75 MG PO TABS: 75.0000 mg | ORAL_TABLET | Freq: Every day | ORAL | 3 refills | Status: AC

## 2023-08-19 MED ORDER — METOPROLOL TARTRATE 25 MG PO TABS: ORAL_TABLET | ORAL | 3 refills | Status: AC

## 2023-08-19 NOTE — Patient Instructions (Signed)
 Medication Instructions:  No changes *If you need a refill on your cardiac medications before your next appointment, please call your pharmacy*  Lab Work: No labs  Testing/Procedures: No testing  Follow-Up: At [facility], you and your health needs are our priority.  As part of our continuing mission to provide you with exceptional heart care, our providers are all part of one team.  This team includes your primary Cardiologist (physician) and Advanced Practice Providers or APPs (Physician Assistants and Nurse Practitioners) who all work together to provide you with the care you need, when you need it.  Your next appointment:   1 year(s)  Provider:   Arnoldo Lapping, MD    We recommend signing up for the patient portal called "MyChart".  Sign up information is provided on this After Visit Summary.  MyChart is used to connect with patients for Virtual Visits (Telemedicine).  Patients are able to view lab/test results, encounter notes, upcoming appointments, etc.  Non-urgent messages can be sent to your provider as well.   To learn more about what you can do with MyChart, go to [email or web address].

## 2023-08-21 ENCOUNTER — Encounter: Payer: Self-pay | Admitting: Nurse Practitioner

## 2023-08-21 ENCOUNTER — Ambulatory Visit: Payer: Medicare Other | Admitting: Nurse Practitioner

## 2023-08-21 ENCOUNTER — Other Ambulatory Visit: Payer: Self-pay | Admitting: Cardiovascular Disease

## 2023-08-21 VITALS — BP 118/80 | HR 70 | Ht 67.0 in | Wt 256.4 lb

## 2023-08-21 DIAGNOSIS — E782 Mixed hyperlipidemia: Secondary | ICD-10-CM

## 2023-08-21 DIAGNOSIS — Z7985 Long-term (current) use of injectable non-insulin antidiabetic drugs: Secondary | ICD-10-CM | POA: Diagnosis not present

## 2023-08-21 DIAGNOSIS — E559 Vitamin D deficiency, unspecified: Secondary | ICD-10-CM

## 2023-08-21 DIAGNOSIS — Z7984 Long term (current) use of oral hypoglycemic drugs: Secondary | ICD-10-CM | POA: Diagnosis not present

## 2023-08-21 DIAGNOSIS — I10 Essential (primary) hypertension: Secondary | ICD-10-CM | POA: Diagnosis not present

## 2023-08-21 DIAGNOSIS — E1165 Type 2 diabetes mellitus with hyperglycemia: Secondary | ICD-10-CM

## 2023-08-21 DIAGNOSIS — I25119 Atherosclerotic heart disease of native coronary artery with unspecified angina pectoris: Secondary | ICD-10-CM

## 2023-08-21 LAB — POCT GLYCOSYLATED HEMOGLOBIN (HGB A1C): Hemoglobin A1C: 7.4 % — AB (ref 4.0–5.6)

## 2023-08-21 MED ORDER — ONETOUCH DELICA PLUS LANCET33G MISC: 12 refills | Status: DC

## 2023-08-21 MED ORDER — METFORMIN HCL ER 500 MG PO TB24: 1000.0000 mg | ORAL_TABLET | Freq: Every day | ORAL | 3 refills | Status: DC

## 2023-08-21 MED ORDER — ACCU-CHEK GUIDE TEST VI STRP: ORAL_STRIP | 12 refills | Status: AC

## 2023-08-21 MED ORDER — SEMAGLUTIDE (2 MG/DOSE) 8 MG/3ML ~~LOC~~ SOPN: 2.0000 mg | PEN_INJECTOR | SUBCUTANEOUS | 3 refills | Status: DC

## 2023-08-21 NOTE — Progress Notes (Signed)
 Endocrinology Follow Up Note       08/21/2023, 8:43 AM   Subjective:    Patient ID: [name], male    DOB: [birth year].  [name] is being seen in follow up after being seen in consultation for management of currently uncontrolled symptomatic diabetes requested by  Cook, Jayce G, DO.   Past Medical History:  Diagnosis Date   Arthritis    CAD (coronary artery disease) 2000   a. History of MI ~2000 s/p PCI. b. CABG in 2006 (LIMA-LAD, RIMA-acute marginal, SVG-DX, SVG-RCA). c. NSTEMI 05/2013:severe stenoses of the SVG-PDA and SVG-diagonal, both treated successfully with PCI (DES x2).   Diabetes (HCC)    GERD (gastroesophageal reflux disease)    Gout    History of hypertension    Hydrocele 2012   Hyperlipidemia    Hypertension    Myocardial infarction [facility]) 2014   x3   Obesity    S/P coronary angioplasty 11/10/17 11/11/2017    Past Surgical History:  Procedure Laterality Date   BIOPSY  09/17/2021   Procedure: BIOPSY;  Surgeon: Urban Garden, MD;  Location: [facility];  Service: Gastroenterology;;   CATARACT EXTRACTION W/PHACO Left 05/17/2012   Procedure: CATARACT EXTRACTION PHACO AND INTRAOCULAR LENS PLACEMENT (IOC);  Surgeon: Anner Kill, MD;  Location: [facility] OR[facility];  Service: Ophthalmology;  Laterality: Left;  CDE:  0.76   CATARACT EXTRACTION W/PHACO Right 08/26/2012   Procedure: CATARACT EXTRACTION PHACO AND INTRAOCULAR LENS PLACEMENT (IOC);  Surgeon: Anner Kill, MD;  Location: [facility];  Service: Ophthalmology;  Laterality: Right;  CDE: 1.13   COLONOSCOPY WITH PROPOFOL  N/A 09/17/2021   Procedure: COLONOSCOPY WITH PROPOFOL ;  Surgeon: Urban Garden, MD;  Location: [facility];  Service: Gastroenterology;  Laterality: N/A;  730   CORONARY ANGIOPLASTY  2000   stents x2   CORONARY ARTERY BYPASS GRAFT  2006   4 vessels   Coronary artery bypass grafting  May 2006   Four-vessel    CORONARY BALLOON ANGIOPLASTY N/A 11/10/2017   Procedure: CORONARY BALLOON ANGIOPLASTY;  Surgeon: Arnoldo Lapping, MD;  Location: [facility] INVASIVE CV LAB;  Service: Cardiovascular;  Laterality: N/A;   FOOT FRACTURE SURGERY     right   HEMOSTASIS CLIP PLACEMENT  09/17/2021   Procedure: HEMOSTASIS CLIP PLACEMENT;  Surgeon: Urban Garden, MD;  [facility]: [facility];  Service: Gastroenterology;;   LEFT HEART CATH AND CORS/GRAFTS ANGIOGRAPHY N/A 11/10/2017   Procedure: LEFT HEART CATH AND CORS/GRAFTS ANGIOGRAPHY;  Surgeon: Arnoldo Lapping, MD;  Location: [facility] INVASIVE CV LAB;  Service: Cardiovascular;  Laterality: N/A;   LEFT HEART CATHETERIZATION WITH CORONARY/GRAFT ANGIOGRAM N/A 05/05/2011   Procedure: LEFT HEART CATHETERIZATION WITH Estella Helling;  Surgeon: Loyde Rule, MD;  Location: [facility] CATH LAB;  Service: Cardiovascular;  Laterality: N/A;   LEFT HEART CATHETERIZATION WITH CORONARY/GRAFT ANGIOGRAM N/A 06/06/2013   Procedure: LEFT HEART CATHETERIZATION WITH Estella Helling;  Surgeon: Arlander Bellman, MD;  Location: [facility] CATH LAB;  Service: Cardiovascular;  Laterality: N/A;   MASS EXCISION Right 05/14/2016   Procedure: EXCISION MASS RIGHT ELBOW;  Surgeon: Darrin Emerald, MD;  Location: [facility];  Service: Orthopedics;  Laterality: Right;   PILONIDAL CYST  EXCISION     POLYPECTOMY  09/17/2021   Procedure: POLYPECTOMY;  Surgeon: Umberto Ganong, Bearl Limes, MD;  Location: [facility];  Service: Gastroenterology;;   TONSILLECTOMY      Social History   Socioeconomic History   Marital status: Divorced    Spouse name: Not on file   Number of children: Not on file   Years of education: Not on file   Highest education level: Some college, no degree  Occupational History   Not on file  Tobacco Use   Smoking status: Never   Smokeless tobacco: Never  Vaping Use   Vaping status: Never Used  Substance and Sexual Activity   Alcohol use: No   Drug use: No   Sexual activity:  Yes    Birth control/protection: None  Other Topics Concern   Not on file  Social History Narrative   Not on file   Social Drivers of Health   Financial Resource Strain: Low Risk  (04/19/2023)   Overall Financial Resource Strain (CARDIA)    Difficulty of Paying Living Expenses: Not very hard  Food Insecurity: No Food Insecurity (04/19/2023)   Hunger Vital Sign    Worried About Running Out of Food in the Last Year: Never true    Ran Out of Food in the Last Year: Never true  Transportation Needs: No Transportation Needs (04/19/2023)   PRAPARE - Administrator, Civil Service (Medical): No    Lack of Transportation (Non-Medical): No  Physical Activity: Insufficiently Active (04/19/2023)   Exercise Vital Sign    Days of Exercise per Week: 3 days    Minutes of Exercise per Session: 30 min  Stress: Stress Concern Present (04/19/2023)   [employer] of Occupational Health - Occupational Stress Questionnaire    Feeling of Stress : To some extent  Social Connections: Moderately Integrated (04/19/2023)   Social Connection and Isolation Panel [NHANES]    Frequency of Communication with Friends and Family: More than three times a week    Frequency of Social Gatherings with Friends and Family: Once a week    Attends Religious Services: More than 4 times per year    Active Member of [employer] or Organizations: Yes    Attends Banker Meetings: 1 to 4 times per year    Marital Status: Divorced  Recent Concern: Social Connections - Moderately Isolated (01/23/2023)   Social Connection and Isolation Panel [NHANES]    Frequency of Communication with Friends and Family: More than three times a week    Frequency of Social Gatherings with Friends and Family: More than three times a week    Attends Religious Services: More than 4 times per year    Active Member of [employer] or Organizations: No    Attends Banker Meetings: Never    Marital Status: Divorced    Family  History  Problem Relation Age of Onset   Cancer Mother    Cervical cancer Mother    Cancer Father    Throat cancer Father    Thyroid  disease Son    Healthy Daughter    Healthy Son     Outpatient Encounter Medications as of 08/21/2023  Medication Sig   aspirin  EC 81 MG tablet Take 81 mg by mouth daily.   blood glucose meter kit and supplies KIT 1 each by Does not apply route daily. Dispense based on patient and insurance preference. Use to check blood glucose daily as directed.   clopidogrel  (PLAVIX ) 75 MG tablet Take  1 tablet (75 mg total) by mouth daily.   Evolocumab  (REPATHA  SURECLICK) 140 MG/ML SOAJ Inject 140 mg into the skin every 14 (fourteen) days.   isosorbide  mononitrate (IMDUR ) 60 MG 24 hr tablet Take 1 tablet (60 mg total) by mouth daily.   losartan  (COZAAR ) 25 MG tablet TAKE 1 TABLET (25 MG TOTAL) BY MOUTH DAILY.   metoprolol  tartrate (LOPRESSOR ) 25 MG tablet TAKE 1 TABLET BY MOUTH TWICE A DAY   nitroGLYCERIN  (NITROSTAT ) 0.4 MG SL tablet PLACE 1 TABLET UNDER THE TONGUE EVERY 5 MINUTES AS NEEDED FOR CHEST PAIN.   rosuvastatin  (CRESTOR ) 40 MG tablet Take 1 tablet (40 mg total) by mouth daily.   [DISCONTINUED] glucose blood (ACCU-CHEK GUIDE TEST) test strip Use as instructed to monitor glucose twice daily   [DISCONTINUED] Lancets (ONETOUCH DELICA PLUS LANCET33G) MISC Use to monitor glucose twice daily.   [DISCONTINUED] metFORMIN  (GLUCOPHAGE -XR) 500 MG 24 hr tablet TAKE 2 TABLETS BY MOUTH EVERY DAY WITH BREAKFAST   [DISCONTINUED] Semaglutide , 2 MG/DOSE, 8 MG/3ML SOPN Inject 2 mg as directed once a week.   glucose blood (ACCU-CHEK GUIDE TEST) test strip Use as instructed to monitor glucose twice daily   Lancets (ONETOUCH DELICA PLUS LANCET33G) MISC Use to monitor glucose twice daily.   metFORMIN  (GLUCOPHAGE -XR) 500 MG 24 hr tablet Take 2 tablets (1,000 mg total) by mouth daily with breakfast.   Semaglutide , 2 MG/DOSE, 8 MG/3ML SOPN Inject 2 mg as directed once a week.   No  facility-administered encounter medications on file as of 08/21/2023.    ALLERGIES: Allergies  Allergen Reactions   Hydrocodone  Nausea Only    VACCINATION STATUS: Immunization History  Administered Date(s) Administered   Fluad Quad(high Dose 65+) 03/23/2020   Influenza, High Dose Seasonal PF 03/12/2021   Influenza, Quadrivalent, Recombinant, Inj, Pf 01/25/2019   Influenza,trivalent, recombinat, inj, PF 01/09/2017   Influenza-Unspecified 01/22/2018   Moderna Sars-Covid-2 Vaccination 05/20/2019, 06/21/2019, 03/23/2020   PNEUMOCOCCAL CONJUGATE-20 06/20/2021   Tdap 03/24/2014   Zoster Recombinant(Shingrix) 10/16/2018, 01/25/2019    Diabetes He presents for his follow-up diabetic visit. He has type 2 diabetes mellitus. Onset time: Recently diagnosed at age 44. His disease course has been stable. There are no hypoglycemic associated symptoms. Pertinent negatives for diabetes include no blurred vision, no fatigue, no polydipsia, no polyuria and no weight loss. There are no hypoglycemic complications. Symptoms are stable. Diabetic complications include heart disease and nephropathy. Risk factors for coronary artery disease include diabetes mellitus, dyslipidemia, family history, obesity, male sex, hypertension and sedentary lifestyle. Current diabetic treatment includes oral agent (monotherapy) (and Ozempic ). He is compliant with treatment most of the time. His weight is fluctuating minimally. He is following a generally healthy diet. When asked about meal planning, he reported none. He has not had a previous visit with a dietitian. He never participates in exercise. His home blood glucose trend is fluctuating minimally. His breakfast blood glucose range is generally 140-180 mg/dl. (He presents today with his meter and logs showing slightly above target fasting glycemic profile.  His POCT A1c today is 7.4%, unchanged from previous visit.  He denies any hypoglycemia, he also admits he has not been as  active in recent weeks.  Analysis of his meter shows 7-day average of 164, 14-day average of 162; 30-day and 90-day averages of 159.) An ACE inhibitor/angiotensin II receptor blocker is not being taken. He sees a podiatrist.Eye exam is current.  Hypertension This is a chronic problem. The current episode started more than 1 year ago. The  problem has been resolved since onset. The problem is controlled. Pertinent negatives include no blurred vision. There are no associated agents to hypertension. Risk factors for coronary artery disease include diabetes mellitus, dyslipidemia, family history, obesity and male gender. Past treatments include beta blockers. The current treatment provides mild improvement. Compliance problems include diet and exercise.  Hypertensive end-organ damage includes kidney disease and CAD/MI. Identifiable causes of hypertension include chronic renal disease.  Hyperlipidemia This is a chronic problem. The current episode started more than 1 year ago. The problem is controlled. Recent lipid tests were reviewed and are normal. Exacerbating diseases include chronic renal disease and diabetes. Factors aggravating his hyperlipidemia include fatty foods and beta blockers. Current antihyperlipidemic treatment includes statins. The current treatment provides moderate improvement of lipids. Compliance problems include adherence to diet and adherence to exercise.  Risk factors for coronary artery disease include diabetes mellitus, dyslipidemia, family history, obesity, male sex and hypertension.     Review of systems  Constitutional: + minimally fluctuating body weight,  current Body mass index is 40.16 kg/m. , no fatigue, no subjective hyperthermia, no subjective hypothermia Eyes: no blurry vision, no xerophthalmia ENT: no sore throat, no nodules palpated in throat, no dysphagia/odynophagia, no hoarseness Cardiovascular: no chest pain, no shortness of breath, no palpitations, no leg  swelling Respiratory: no cough, no shortness of breath Gastrointestinal: no nausea/vomiting/diarrhea, + alternating constipation/diarrhea Musculoskeletal: no muscle/joint aches Skin: no rashes, no hyperemia Neurological: no tremors, no numbness, no tingling, no dizziness Psychiatric: no depression, no anxiety  Objective:     BP 118/80 (BP Location: Right Arm, Patient Position: Sitting, Cuff Size: Large)   Pulse 70   Ht 5\' 7"  (1.702 m)   Wt 256 lb 6.4 oz (116.3 kg)   BMI 40.16 kg/m   Wt Readings from Last 3 Encounters:  08/21/23 256 lb 6.4 oz (116.3 kg)  08/19/23 254 lb 12.8 oz (115.6 kg)  04/23/23 256 lb 12.8 oz (116.5 kg)     BP Readings from Last 3 Encounters:  08/21/23 118/80  08/19/23 134/78  04/23/23 120/73       Physical Exam- Limited  Constitutional:  Body mass index is 40.16 kg/m. , not in acute distress, normal state of mind Eyes:  EOMI, no exophthalmos Musculoskeletal: no gross deformities, strength intact in all four extremities, no gross restriction of joint movements Skin:  no rashes, no hyperemia Neurological: no tremor with outstretched hands   Diabetic Foot Exam - Simple   No data filed    CMP ( most recent) CMP     Component Value Date/Time   NA 138 04/22/2023 1145   K 5.0 04/22/2023 1145   CL 99 04/22/2023 1145   CO2 27 04/22/2023 1145   GLUCOSE 121 (H) 04/22/2023 1145   GLUCOSE 140 (H) 09/13/2021 0919   BUN 12 04/22/2023 1145   CREATININE 0.95 04/22/2023 1145   CREATININE 1.00 05/07/2015 0834   CALCIUM  9.6 04/22/2023 1145   PROT 6.9 04/22/2023 1145   ALBUMIN 4.4 04/22/2023 1145   AST 26 04/22/2023 1145   ALT 24 04/22/2023 1145   ALKPHOS 95 04/22/2023 1145   BILITOT 1.2 04/22/2023 1145   GFRNONAA >60 09/13/2021 0919   GFRAA 88 11/16/2018 1027     Diabetic Labs (most recent): Lab Results  Component Value Date   HGBA1C 7.4 (A) 08/21/2023   HGBA1C 7.4 (H) 04/22/2023   HGBA1C 6.7 (A) 12/22/2022   MICROALBUR 80 mg/L 05/21/2022      Lipid Panel ( most recent) Lipid  Panel     Component Value Date/Time   CHOL 98 (L) 04/22/2023 1145   TRIG 128 04/22/2023 1145   HDL 45 04/22/2023 1145   CHOLHDL 2.2 04/22/2023 1145   CHOLHDL 3.4 05/07/2015 0834   VLDL 16 05/07/2015 0834   LDLCALC 30 04/22/2023 1145   LABVLDL 23 04/22/2023 1145      Lab Results  Component Value Date   TSH 2.830 08/14/2022   TSH 2.270 06/27/2020   TSH 1.478 05/03/2011   FREET4 1.29 08/14/2022           Assessment & Plan:   1) Type 2 diabetes mellitus with hyperglycemia, without long-term current use of insulin (HCC)  He presents today with his meter and logs showing slightly above target fasting glycemic profile.  His POCT A1c today is 7.4%, unchanged from previous visit.  He denies any hypoglycemia, he also admits he has not been as active in recent weeks.  Analysis of his meter shows 7-day average of 164, 14-day average of 162; 30-day and 90-day averages of 159.  - [name] has currently uncontrolled symptomatic type 2 DM since 68 years of age.   -Recent labs reviewed.  - I had a long discussion with him about the progressive nature of diabetes and the pathology behind its complications. -his diabetes is complicated by CAD with MI and mild CKD and he remains at a high risk for more acute and chronic complications which include CAD, CVA, CKD, retinopathy, and neuropathy. These are all discussed in detail with him.  The following Lifestyle Medicine recommendations according to American College of Lifestyle Medicine [facility]) were discussed and offered to patient and he agrees to start the journey:  A. Whole Foods, Plant-based plate comprising of fruits and vegetables, plant-based proteins, whole-grain carbohydrates was discussed in detail with the patient.   A list for source of those nutrients were also provided to the patient.  Patient will use only water or unsweetened tea for hydration. B.  The need to stay away from risky  substances including alcohol, smoking; obtaining 7 to 9 hours of restorative sleep, at least 150 minutes of moderate intensity exercise weekly, the importance of healthy social connections,  and stress reduction techniques were discussed. C.  A full color page of  Calorie density of various food groups per pound showing examples of each food groups was provided to the patient.  - Nutritional counseling repeated at each appointment due to patients tendency to fall back in to old habits.  - The patient admits there is a room for improvement in their diet and drink choices. -  Suggestion is made for the patient to avoid simple carbohydrates from their diet including Cakes, Sweet Desserts / Pastries, Ice Cream, Soda (diet and regular), Sweet Tea, Candies, Chips, Cookies, Sweet Pastries, Store Bought Juices, Alcohol in Excess of 1-2 drinks a day, Artificial Sweeteners, Coffee Creamer, and "Sugar-free" Products. This will help patient to have stable blood glucose profile and potentially avoid unintended weight gain.   - I encouraged the patient to switch to unprocessed or minimally processed complex starch and increased protein intake (animal or plant source), fruits, and vegetables.   - Patient is advised to stick to a routine mealtimes to eat 3 meals a day and avoid unnecessary snacks (to snack only to correct hypoglycemia).  - I have approached him with the following individualized plan to manage his diabetes and patient agrees:   - He is advised to continue Metformin  1000 mg ER daily  with breakfast and Ozempic  2 mg SQ weekly.     -he is encouraged to continue monitoring blood glucose daily, before breakfast, and to call the clinic if he has readings less than 70 or above 300 for 3 tests in a row.  He is on safe regimen and doesn't need to monitor glucose every day but prefers to continue to prevent losing control.  - Adjustment parameters are given to him for hypo and hyperglycemia in writing.  -  Specific targets for  A1c; LDL, HDL, and Triglycerides were discussed with the patient.  2) Blood Pressure /Hypertension:  his blood pressure is controlled to target.   he is advised to continue his current medications as prescribed by PCP and cardiology.  3) Lipids/Hyperlipidemia:    Review of his recent lipid panel from 04/22/23 showed controlled LDL at 30 .  he is advised to continue Crestor  40 mg daily at bedtime and Repatha  per cardiology.  Side effects and precautions discussed with him.  4)  Weight/Diet:  his Body mass index is 40.16 kg/m.  -  clearly complicating his diabetes care.   he is a candidate for weight loss. I discussed with him the fact that loss of 5 - 10% of his  current body weight will have the most impact on his diabetes management.  Exercise, and detailed carbohydrates information provided  -  detailed on discharge instructions.  5) Chronic Care/Health Maintenance: -he is not on ACEI/ARB and is on Statin medications and is encouraged to initiate and continue to follow up with Ophthalmology, Dentist, Podiatrist at least yearly or according to recommendations, and advised to stay away from smoking. I have recommended yearly flu vaccine and pneumonia vaccine at least every 5 years; moderate intensity exercise for up to 150 minutes weekly; and sleep for at least 7 hours a day.  - he is advised to maintain close follow up with Cook, Jayce G, DO for primary care needs, as well as his other providers for optimal and coordinated care.     I spent  34  minutes in the care of the patient today including review of labs from CMP, Lipids, Thyroid  Function, Hematology (current and previous including abstractions from other facilities); face-to-face time discussing  his blood glucose readings/logs, discussing hypoglycemia and hyperglycemia episodes and symptoms, medications doses, his options of short and long term treatment based on the latest standards of care / guidelines;  discussion  about incorporating lifestyle medicine;  and documenting the encounter. Risk reduction counseling performed per USPSTF guidelines to reduce obesity and cardiovascular risk factors.     Please refer to Patient Instructions for Blood Glucose Monitoring and Insulin/Medications Dosing Guide"  in media tab for additional information. Please  also refer to " Patient Self Inventory" in the Media  tab for reviewed elements of pertinent patient history.  Scarlette Currier participated in the discussions, expressed understanding, and voiced agreement with the above plans.  All questions were answered to his satisfaction. he is encouraged to contact clinic should he have any questions or concerns prior to his return visit.     Follow up plan: - Return in about 4 months (around 12/22/2023) for Diabetes F/U with A1c in office, No previsit labs.   Hulon Magic, [facility] [address] [place], [place] [address] Phone: [phone] Fax: [phone]  08/21/2023, 8:43 AM

## 2023-10-20 ENCOUNTER — Ambulatory Visit: Payer: Medicare Other | Admitting: Family Medicine

## 2023-10-20 DIAGNOSIS — I25119 Atherosclerotic heart disease of native coronary artery with unspecified angina pectoris: Secondary | ICD-10-CM

## 2023-10-20 DIAGNOSIS — E782 Mixed hyperlipidemia: Secondary | ICD-10-CM

## 2023-10-20 DIAGNOSIS — M79605 Pain in left leg: Secondary | ICD-10-CM

## 2023-10-20 DIAGNOSIS — E1165 Type 2 diabetes mellitus with hyperglycemia: Secondary | ICD-10-CM | POA: Diagnosis not present

## 2023-10-20 DIAGNOSIS — I10 Essential (primary) hypertension: Secondary | ICD-10-CM

## 2023-10-20 DIAGNOSIS — M79604 Pain in right leg: Secondary | ICD-10-CM | POA: Diagnosis not present

## 2023-10-20 MED ORDER — NITROGLYCERIN 0.4 MG SL SUBL: 0.4000 mg | SUBLINGUAL_TABLET | SUBLINGUAL | 0 refills | Status: AC | PRN

## 2023-10-20 MED ORDER — REPATHA SURECLICK 140 MG/ML ~~LOC~~ SOAJ: SUBCUTANEOUS | 3 refills | Status: AC

## 2023-10-20 NOTE — Assessment & Plan Note (Signed)
 Given significant vascular disease, concerned for PAD.  Arranging ABI.

## 2023-10-20 NOTE — Assessment & Plan Note (Signed)
 I discussed the patient's exertional symptoms with him today.  I advised him that we can proceed with reaching out to cardiology for further evaluation like a stress test or catheterization.  Patient wanted to wait at this time.  Advised regular exercise but advised him not to push himself to where he is getting significant exertional symptoms.

## 2023-10-20 NOTE — Assessment & Plan Note (Signed)
 Not at goal.  Needs weight loss.

## 2023-10-20 NOTE — Patient Instructions (Signed)
 I will reach out to cardiology.  I have ordered and ABI.  Try and walk at least 3 times a week.  Mediterranean diet.  Follow up in 6 months.

## 2023-10-20 NOTE — Assessment & Plan Note (Signed)
 Stable.  Continue current medications.

## 2023-10-20 NOTE — Assessment & Plan Note (Signed)
At goal on Repatha and Crestor

## 2023-10-20 NOTE — Progress Notes (Signed)
 Subjective:  Patient ID: [name], male    DOB: 09-06-1955  Age: 68 y.o. MRN: [id number]  CC:  Follow up   HPI:  68 year old male with hypertension, coronary disease, GERD, fatty liver, type 2 diabetes, morbid obesity, and hyperlipidemia presents for follow-up.  Patient follows with endocrinology.  His most recent A1c was 7.4 and is unchanged from his prior A1c in January.  He denies any changes to his diet.  He is not particularly active.  He states that he tries to walk but often finds that he experiences significant leg pain when walking.  He localizes the pain anteriorly.  He does state that it improves with rest.  Patient also reports that if he pushes himself (like walking on an incline) he will experience chest pain and shortness of breath.  Recently seen by cardiology.  Did not endorse any symptoms at that time.  Hypertension is stable on losartan  and metoprolol .  Lipids have been at goal on Crestor  and Repatha .  Patient Active Problem List   Diagnosis Date Noted   Morbid obesity (HCC) 10/20/2023   Pain in both lower extremities 10/20/2023   Mixed hyperlipidemia 06/20/2021   History of gout 06/20/2021   Type 2 diabetes mellitus with hyperglycemia (HCC) 06/20/2021   Coronary artery disease involving native coronary artery with angina pectoris (HCC)    GERD (gastroesophageal reflux disease) 07/12/2012   Fatty liver 07/07/2011   Essential hypertension 08/22/2008    Social Hx   Social History   Socioeconomic History   Marital status: Divorced    Spouse name: Not on file   Number of children: Not on file   Years of education: Not on file   Highest education level: 12th grade  Occupational History   Not on file  Tobacco Use   Smoking status: Never   Smokeless tobacco: Never  Vaping Use   Vaping status: Never Used  Substance and Sexual Activity   Alcohol use: No   Drug use: No   Sexual activity: Yes    Birth control/protection: None  Other Topics Concern    Not on file  Social History Narrative   Not on file   Social Drivers of Health   Financial Resource Strain: Low Risk  (10/16/2023)   Overall Financial Resource Strain (CARDIA)    Difficulty of Paying Living Expenses: Not hard at all  Food Insecurity: No Food Insecurity (10/16/2023)   Hunger Vital Sign    Worried About Running Out of Food in the Last Year: Never true    Ran Out of Food in the Last Year: Never true  Transportation Needs: No Transportation Needs (10/16/2023)   PRAPARE - Administrator, Civil Service (Medical): No    Lack of Transportation (Non-Medical): No  Physical Activity: Insufficiently Active (10/16/2023)   Exercise Vital Sign    Days of Exercise per Week: 4 days    Minutes of Exercise per Session: 20 min  Stress: No Stress Concern Present (10/16/2023)   [employer] of Occupational Health - Occupational Stress Questionnaire    Feeling of Stress: Only a little  Social Connections: Moderately Integrated (10/16/2023)   Social Connection and Isolation Panel    Frequency of Communication with Friends and Family: More than three times a week    Frequency of Social Gatherings with Friends and Family: Once a week    Attends Religious Services: More than 4 times per year    Active Member of [employer] or Organizations: Yes  Attends Banker Meetings: 1 to 4 times per year    Marital Status: Divorced    Review of Systems Per HPI  Objective:  BP 132/78   Pulse 65   Temp (!) 97.3 F (36.3 C)   Ht 5' 7 (1.702 m)   Wt 255 lb 9.6 oz (115.9 kg)   SpO2 97%   BMI 40.03 kg/m      10/20/2023    9:11 AM 08/21/2023    8:18 AM 08/19/2023   10:56 AM  BP/Weight  Systolic BP 132 118 134  Diastolic BP 78 80 78  Wt. (Lbs) 255.6 256.4 254.8  BMI 40.03 kg/m2 40.16 kg/m2 39.91 kg/m2    Physical Exam Constitutional:      Appearance: Normal appearance. He is obese.  HENT:     Head: Normocephalic and atraumatic.  Cardiovascular:     Rate and  Rhythm: Normal rate and regular rhythm.  Pulmonary:     Effort: Pulmonary effort is normal.     Breath sounds: Normal breath sounds. No wheezing or rales.  Feet:     Comments: Diminished PT and DP pulses in the right foot. Neurological:     Mental Status: He is alert.     Lab Results  Component Value Date   WBC 8.0 04/22/2023   HGB 13.9 04/22/2023   HCT 40.9 04/22/2023   PLT 243 04/22/2023   GLUCOSE 121 (H) 04/22/2023   CHOL 98 (L) 04/22/2023   TRIG 128 04/22/2023   HDL 45 04/22/2023   LDLCALC 30 04/22/2023   ALT 24 04/22/2023   AST 26 04/22/2023   NA 138 04/22/2023   K 5.0 04/22/2023   CL 99 04/22/2023   CREATININE 0.95 04/22/2023   BUN 12 04/22/2023   CO2 27 04/22/2023   TSH 2.830 08/14/2022   INR 1.05 06/06/2013   HGBA1C 7.4 (A) 08/21/2023   MICROALBUR 80 mg/L 05/21/2022     Assessment & Plan:  Pain in both lower extremities Assessment & Plan: Given significant vascular disease, concerned for PAD.  Arranging ABI.  Orders: -     US  ARTERIAL ABI (SCREENING LOWER EXTREMITY)  Coronary artery disease involving native coronary artery of native heart with angina pectoris [facility]) Assessment & Plan: I discussed the patient's exertional symptoms with him today.  I advised him that we can proceed with reaching out to cardiology for further evaluation like a stress test or catheterization.  Patient wanted to wait at this time.  Advised regular exercise but advised him not to push himself to where he is getting significant exertional symptoms.  Orders: -     Nitroglycerin ; Place 1 tablet (0.4 mg total) under the tongue every 5 (five) minutes as needed for chest pain.  Dispense: 25 tablet; Refill: 0 -     Repatha  SureClick; ADMINISTER 1 ML UNDER THE SKIN EVERY 14 DAYS  Dispense: 6 mL; Refill: 3  Essential hypertension Assessment & Plan: Stable.  Continue current medications.   Type 2 diabetes mellitus with hyperglycemia, without long-term current use of insulin  (HCC) Assessment & Plan: Not at goal.  Needs weight loss.   Mixed hyperlipidemia Assessment & Plan: At goal on Repatha  and Crestor .  Orders: -     Repatha  SureClick; ADMINISTER 1 ML UNDER THE SKIN EVERY 14 DAYS  Dispense: 6 mL; Refill: 3    Follow-up:  6 months.  Jacqulyn Ahle DO [facility]

## 2023-10-28 ENCOUNTER — Ambulatory Visit (HOSPITAL_COMMUNITY)
Admission: RE | Admit: 2023-10-28 | Discharge: 2023-10-28 | Disposition: A | Discharge status: Home / Self Care | Source: Ambulatory Visit | Attending: Family Medicine | Admitting: Family Medicine

## 2023-10-28 ENCOUNTER — Ambulatory Visit: Payer: Self-pay | Admitting: Family Medicine

## 2023-10-28 DIAGNOSIS — M79604 Pain in right leg: Secondary | ICD-10-CM | POA: Diagnosis not present

## 2023-10-28 DIAGNOSIS — M79605 Pain in left leg: Secondary | ICD-10-CM | POA: Diagnosis not present

## 2023-10-28 DIAGNOSIS — I10 Essential (primary) hypertension: Secondary | ICD-10-CM | POA: Diagnosis not present

## 2023-10-28 DIAGNOSIS — E1151 Type 2 diabetes mellitus with diabetic peripheral angiopathy without gangrene: Secondary | ICD-10-CM | POA: Diagnosis not present

## 2023-10-28 DIAGNOSIS — E785 Hyperlipidemia, unspecified: Secondary | ICD-10-CM | POA: Diagnosis not present

## 2023-12-24 ENCOUNTER — Ambulatory Visit: Admitting: Nurse Practitioner

## 2023-12-24 ENCOUNTER — Encounter: Payer: Self-pay | Admitting: Nurse Practitioner

## 2023-12-24 VITALS — BP 118/70 | HR 74 | Ht 67.0 in | Wt 257.0 lb

## 2023-12-24 DIAGNOSIS — E782 Mixed hyperlipidemia: Secondary | ICD-10-CM

## 2023-12-24 DIAGNOSIS — I10 Essential (primary) hypertension: Secondary | ICD-10-CM | POA: Diagnosis not present

## 2023-12-24 DIAGNOSIS — E1165 Type 2 diabetes mellitus with hyperglycemia: Secondary | ICD-10-CM

## 2023-12-24 DIAGNOSIS — Z7985 Long-term (current) use of injectable non-insulin antidiabetic drugs: Secondary | ICD-10-CM

## 2023-12-24 DIAGNOSIS — E559 Vitamin D deficiency, unspecified: Secondary | ICD-10-CM

## 2023-12-24 DIAGNOSIS — Z7984 Long term (current) use of oral hypoglycemic drugs: Secondary | ICD-10-CM | POA: Diagnosis not present

## 2023-12-24 LAB — POCT GLYCOSYLATED HEMOGLOBIN (HGB A1C): Hemoglobin A1C: 8 % — AB (ref 4.0–5.6)

## 2023-12-24 MED ORDER — METFORMIN HCL ER 500 MG PO TB24: 1000.0000 mg | ORAL_TABLET | Freq: Every day | ORAL | 3 refills | Status: AC

## 2023-12-24 MED ORDER — ACCU-CHEK SOFTCLIX LANCETS MISC: 12 refills | Status: AC

## 2023-12-24 MED ORDER — TIRZEPATIDE 7.5 MG/0.5ML ~~LOC~~ SOAJ: 7.5000 mg | SUBCUTANEOUS | 1 refills | Status: AC

## 2023-12-24 NOTE — Progress Notes (Signed)
 Endocrinology Follow Up Note       12/24/2023, 8:52 AM   Subjective:    Patient ID: [name], male    DOB: 11-03-1955.  [name] is being seen in follow up after being seen in consultation for management of currently uncontrolled symptomatic diabetes requested by  Cook, Jayce G, DO.   Past Medical History:  Diagnosis Date   Arthritis    CAD (coronary artery disease) 2000   a. History of MI ~2000 s/p PCI. b. CABG in 2006 (LIMA-LAD, RIMA-acute marginal, SVG-DX, SVG-RCA). c. NSTEMI 05/2013:severe stenoses of the SVG-PDA and SVG-diagonal, both treated successfully with PCI (DES x2).   Diabetes (HCC)    GERD (gastroesophageal reflux disease)    Gout    History of hypertension    Hydrocele 2012   Hyperlipidemia    Hypertension    Myocardial infarction [facility]) 2014   x3   Obesity    S/P coronary angioplasty 11/10/17 11/11/2017    Past Surgical History:  Procedure Laterality Date   BIOPSY  09/17/2021   Procedure: BIOPSY;  Surgeon: Eartha Angelia Sieving, MD;  [facility];  Service: Gastroenterology;;   CATARACT EXTRACTION W/PHACO Left 05/17/2012   Procedure: CATARACT EXTRACTION PHACO AND INTRAOCULAR LENS PLACEMENT (IOC);  Surgeon: Cherene Mania, MD;  Location: [facility];  Service: Ophthalmology;  Laterality: Left;  CDE:  0.76   CATARACT EXTRACTION W/PHACO Right 08/26/2012   Procedure: CATARACT EXTRACTION PHACO AND INTRAOCULAR LENS PLACEMENT (IOC);  Surgeon: Cherene Mania, MD;  Location: [facility];  Service: Ophthalmology;  Laterality: Right;  CDE: 1.13   COLONOSCOPY WITH PROPOFOL  N/A 09/17/2021   Procedure: COLONOSCOPY WITH PROPOFOL ;  Surgeon: Eartha Angelia Sieving, MD;  [facility];  Service: Gastroenterology;  Laterality: N/A;  730   CORONARY ANGIOPLASTY  2000   stents x2   CORONARY ARTERY BYPASS GRAFT  2006   4 vessels   Coronary artery bypass grafting  May 2006   Four-vessel    CORONARY BALLOON ANGIOPLASTY N/A 11/10/2017   Procedure: CORONARY BALLOON ANGIOPLASTY;  Surgeon: Wonda Sharper, MD;  Location: [facility] INVASIVE CV LAB;  Service: Cardiovascular;  Laterality: N/A;   FOOT FRACTURE SURGERY     right   HEMOSTASIS CLIP PLACEMENT  09/17/2021   Procedure: HEMOSTASIS CLIP PLACEMENT;  Surgeon: Eartha Angelia Sieving, MD;  [facility];  Service: Gastroenterology;;   LEFT HEART CATH AND CORS/GRAFTS ANGIOGRAPHY N/A 11/10/2017   Procedure: LEFT HEART CATH AND CORS/GRAFTS ANGIOGRAPHY;  Surgeon: Wonda Sharper, MD;  Location: [facility] INVASIVE CV LAB;  Service: Cardiovascular;  Laterality: N/A;   LEFT HEART CATHETERIZATION WITH CORONARY/GRAFT ANGIOGRAM N/A 05/05/2011   Procedure: LEFT HEART CATHETERIZATION WITH EL BILE;  Surgeon: Maude JAYSON Emmer, MD;  Location: [facility] CATH LAB;  Service: Cardiovascular;  Laterality: N/A;   LEFT HEART CATHETERIZATION WITH CORONARY/GRAFT ANGIOGRAM N/A 06/06/2013   Procedure: LEFT HEART CATHETERIZATION WITH EL BILE;  Surgeon: Sharper JONETTA Wonda, MD;  Location: [facility] CATH LAB;  Service: Cardiovascular;  Laterality: N/A;   MASS EXCISION Right 05/14/2016   Procedure: EXCISION MASS RIGHT ELBOW;  Surgeon: Taft FORBES Minerva, MD;  Location: [facility];  Service: Orthopedics;  Laterality: Right;   PILONIDAL CYST  EXCISION     POLYPECTOMY  09/17/2021   Procedure: POLYPECTOMY;  Surgeon: Eartha Flavors, Toribio, MD;  [facility];  Service: Gastroenterology;;   TONSILLECTOMY      Social History   Socioeconomic History   Marital status: Divorced    Spouse name: Not on file   Number of children: Not on file   Years of education: Not on file   Highest education level: 12th grade  Occupational History   Not on file  Tobacco Use   Smoking status: Never   Smokeless tobacco: Never  Vaping Use   Vaping status: Never Used  Substance and Sexual Activity   Alcohol use: No   Drug use: No   Sexual activity: Yes    Birth  control/protection: None  Other Topics Concern   Not on file  Social History Narrative   Not on file   Social Drivers of Health   Financial Resource Strain: Low Risk  (10/16/2023)   Overall Financial Resource Strain (CARDIA)    Difficulty of Paying Living Expenses: Not hard at all  Food Insecurity: No Food Insecurity (10/16/2023)   Hunger Vital Sign    Worried About Running Out of Food in the Last Year: Never true    Ran Out of Food in the Last Year: Never true  Transportation Needs: No Transportation Needs (10/16/2023)   PRAPARE - Administrator, Civil Service (Medical): No    Lack of Transportation (Non-Medical): No  Physical Activity: Insufficiently Active (10/16/2023)   Exercise Vital Sign    Days of Exercise per Week: 4 days    Minutes of Exercise per Session: 20 min  Stress: No Stress Concern Present (10/16/2023)   [employer] of Occupational Health - Occupational Stress Questionnaire    Feeling of Stress: Only a little  Social Connections: Moderately Integrated (10/16/2023)   Social Connection and Isolation Panel    Frequency of Communication with Friends and Family: More than three times a week    Frequency of Social Gatherings with Friends and Family: Once a week    Attends Religious Services: More than 4 times per year    Active Member of [employer] or Organizations: Yes    Attends Banker Meetings: 1 to 4 times per year    Marital Status: Divorced    Family History  Problem Relation Age of Onset   Cancer Mother    Cervical cancer Mother    Cancer Father    Throat cancer Father    Thyroid  disease Son    Healthy Daughter    Healthy Son     Outpatient Encounter Medications as of 12/24/2023  Medication Sig   Accu-Chek Softclix Lancets lancets Use as instructed to monitor glucose once daily   aspirin  EC 81 MG tablet Take 81 mg by mouth daily.   blood glucose meter kit and supplies KIT 1 each by Does not apply route daily. Dispense based on  patient and insurance preference. Use to check blood glucose daily as directed.   clopidogrel  (PLAVIX ) 75 MG tablet Take 1 tablet (75 mg total) by mouth daily.   Evolocumab  (REPATHA  SURECLICK) 140 MG/ML SOAJ ADMINISTER 1 ML UNDER THE SKIN EVERY 14 DAYS   glucose blood (ACCU-CHEK GUIDE TEST) test strip Use as instructed to monitor glucose twice daily   isosorbide  mononitrate (IMDUR ) 60 MG 24 hr tablet Take 1 tablet (60 mg total) by mouth daily.   losartan  (COZAAR ) 25 MG tablet TAKE 1 TABLET (25 MG TOTAL) BY  MOUTH DAILY.   metoprolol  tartrate (LOPRESSOR ) 25 MG tablet TAKE 1 TABLET BY MOUTH TWICE A DAY (Patient taking differently: Take 25 mg by mouth 2 (two) times daily. TAKE 1 TABLET BY MOUTH TWICE A DAY)   nitroGLYCERIN  (NITROSTAT ) 0.4 MG SL tablet Place 1 tablet (0.4 mg total) under the tongue every 5 (five) minutes as needed for chest pain.   rosuvastatin  (CRESTOR ) 40 MG tablet Take 1 tablet (40 mg total) by mouth daily.   tirzepatide  (MOUNJARO ) 7.5 MG/0.5ML Pen Inject 7.5 mg into the skin once a week.   [DISCONTINUED] metFORMIN  (GLUCOPHAGE -XR) 500 MG 24 hr tablet Take 2 tablets (1,000 mg total) by mouth daily with breakfast.   [DISCONTINUED] Semaglutide , 2 MG/DOSE, 8 MG/3ML SOPN Inject 2 mg as directed once a week.   metFORMIN  (GLUCOPHAGE -XR) 500 MG 24 hr tablet Take 2 tablets (1,000 mg total) by mouth daily with breakfast.   [DISCONTINUED] Lancets (ONETOUCH DELICA PLUS LANCET33G) MISC Use to monitor glucose twice daily.   No facility-administered encounter medications on file as of 12/24/2023.    ALLERGIES: Allergies  Allergen Reactions   Hydrocodone  Nausea Only    VACCINATION STATUS: Immunization History  Administered Date(s) Administered   Fluad Quad(high Dose 65+) 03/23/2020   INFLUENZA, HIGH DOSE SEASONAL PF 03/12/2021   Influenza, Quadrivalent, Recombinant, Inj, Pf 01/25/2019   Influenza,trivalent, recombinat, inj, PF 01/09/2017   Influenza-Unspecified 01/22/2018   Moderna  Sars-Covid-2 Vaccination 05/20/2019, 06/21/2019, 03/23/2020   PNEUMOCOCCAL CONJUGATE-20 06/20/2021   Tdap 03/24/2014   Zoster Recombinant(Shingrix) 10/16/2018, 01/25/2019    Diabetes He presents for his follow-up diabetic visit. He has type 2 diabetes mellitus. Onset time: Recently diagnosed at age 69. His disease course has been stable. There are no hypoglycemic associated symptoms. Pertinent negatives for diabetes include no blurred vision, no fatigue, no polydipsia, no polyuria and no weight loss. There are no hypoglycemic complications. Symptoms are stable. Diabetic complications include heart disease and nephropathy. Risk factors for coronary artery disease include diabetes mellitus, dyslipidemia, family history, obesity, male sex, hypertension and sedentary lifestyle. Current diabetic treatment includes oral agent (monotherapy) (and Ozempic ). He is compliant with treatment most of the time. His weight is fluctuating minimally. He is following a generally healthy diet. When asked about meal planning, he reported none. He has not had a previous visit with a dietitian. He never participates in exercise. His home blood glucose trend is fluctuating minimally. His breakfast blood glucose range is generally 140-180 mg/dl. (He presents today with his meter and logs showing slightly above target fasting glycemic profile.  His POCT A1c today is 8%, increasing from last visit of 7.4%  He denies any hypoglycemia. Analysis of his meter shows 7-day average of 150, 14-day average of 156; 30-day average of 169 and 90-day averages of 166.) An ACE inhibitor/angiotensin II receptor blocker is not being taken. He sees a podiatrist.Eye exam is current.  Hypertension This is a chronic problem. The current episode started more than 1 year ago. The problem has been resolved since onset. The problem is controlled. Pertinent negatives include no blurred vision. There are no associated agents to hypertension. Risk factors for  coronary artery disease include diabetes mellitus, dyslipidemia, family history, obesity and male gender. Past treatments include beta blockers. The current treatment provides mild improvement. Compliance problems include diet and exercise.  Hypertensive end-organ damage includes kidney disease and CAD/MI. Identifiable causes of hypertension include chronic renal disease.  Hyperlipidemia This is a chronic problem. The current episode started more than 1 year ago. The  problem is controlled. Recent lipid tests were reviewed and are normal. Exacerbating diseases include chronic renal disease and diabetes. Factors aggravating his hyperlipidemia include fatty foods and beta blockers. Current antihyperlipidemic treatment includes statins. The current treatment provides moderate improvement of lipids. Compliance problems include adherence to diet and adherence to exercise.  Risk factors for coronary artery disease include diabetes mellitus, dyslipidemia, family history, obesity, male sex and hypertension.     Review of systems  Constitutional: + minimally fluctuating body weight,  current Body mass index is 40.25 kg/m. , no fatigue, no subjective hyperthermia, no subjective hypothermia Eyes: no blurry vision, no xerophthalmia ENT: no sore throat, no nodules palpated in throat, no dysphagia/odynophagia, no hoarseness Cardiovascular: no chest pain, no shortness of breath, no palpitations, no leg swelling Respiratory: no cough, no shortness of breath Gastrointestinal: no nausea/vomiting/diarrhea Musculoskeletal: no muscle/joint aches Skin: no rashes, no hyperemia Neurological: no tremors, no numbness, no tingling, no dizziness Psychiatric: no depression, no anxiety  Objective:     BP 118/70 (BP Location: Left Arm, Patient Position: Sitting, Cuff Size: Large)   Pulse 74   Ht 5' 7 (1.702 m)   Wt 257 lb (116.6 kg)   BMI 40.25 kg/m   Wt Readings from Last 3 Encounters:  12/24/23 257 lb (116.6 kg)   10/20/23 255 lb 9.6 oz (115.9 kg)  08/21/23 256 lb 6.4 oz (116.3 kg)     BP Readings from Last 3 Encounters:  12/24/23 118/70  10/20/23 132/78  08/21/23 118/80       Physical Exam- Limited  Constitutional:  Body mass index is 40.25 kg/m. , not in acute distress, normal state of mind Eyes:  EOMI, no exophthalmos Musculoskeletal: no gross deformities, strength intact in all four extremities, no gross restriction of joint movements Skin:  no rashes, no hyperemia Neurological: no tremor with outstretched hands   Diabetic Foot Exam - Simple   No data filed    CMP ( most recent) CMP     Component Value Date/Time   NA 138 04/22/2023 1145   K 5.0 04/22/2023 1145   CL 99 04/22/2023 1145   CO2 27 04/22/2023 1145   GLUCOSE 121 (H) 04/22/2023 1145   GLUCOSE 140 (H) 09/13/2021 0919   BUN 12 04/22/2023 1145   CREATININE 0.95 04/22/2023 1145   CREATININE 1.00 05/07/2015 0834   CALCIUM  9.6 04/22/2023 1145   PROT 6.9 04/22/2023 1145   ALBUMIN 4.4 04/22/2023 1145   AST 26 04/22/2023 1145   ALT 24 04/22/2023 1145   ALKPHOS 95 04/22/2023 1145   BILITOT 1.2 04/22/2023 1145   GFRNONAA >60 09/13/2021 0919   GFRAA 88 11/16/2018 1027     Diabetic Labs (most recent): Lab Results  Component Value Date   HGBA1C 8.0 (A) 12/24/2023   HGBA1C 7.4 (A) 08/21/2023   HGBA1C 7.4 (H) 04/22/2023   MICROALBUR 80 mg/L 05/21/2022     Lipid Panel ( most recent) Lipid Panel     Component Value Date/Time   CHOL 98 (L) 04/22/2023 1145   TRIG 128 04/22/2023 1145   HDL 45 04/22/2023 1145   CHOLHDL 2.2 04/22/2023 1145   CHOLHDL 3.4 05/07/2015 0834   VLDL 16 05/07/2015 0834   LDLCALC 30 04/22/2023 1145   LABVLDL 23 04/22/2023 1145      Lab Results  Component Value Date   TSH 2.830 08/14/2022   TSH 2.270 06/27/2020   TSH 1.478 05/03/2011   FREET4 1.29 08/14/2022  Assessment & Plan:   1) Type 2 diabetes mellitus with hyperglycemia, without long-term current use of  insulin (HCC)  He presents today with his meter and logs showing slightly above target fasting glycemic profile.  His POCT A1c today is 8%, increasing from last visit of 7.4%  He denies any hypoglycemia. Analysis of his meter shows 7-day average of 150, 14-day average of 156; 30-day average of 169 and 90-day averages of 166.  - [name] has currently uncontrolled symptomatic type 2 DM since 68 years of age.   -Recent labs reviewed.  - I had a long discussion with him about the progressive nature of diabetes and the pathology behind its complications. -his diabetes is complicated by CAD with MI and mild CKD and he remains at a high risk for more acute and chronic complications which include CAD, CVA, CKD, retinopathy, and neuropathy. These are all discussed in detail with him.  The following Lifestyle Medicine recommendations according to American College of Lifestyle Medicine [facility]) were discussed and offered to patient and he agrees to start the journey:  A. Whole Foods, Plant-based plate comprising of fruits and vegetables, plant-based proteins, whole-grain carbohydrates was discussed in detail with the patient.   A list for source of those nutrients were also provided to the patient.  Patient will use only water or unsweetened tea for hydration. B.  The need to stay away from risky substances including alcohol, smoking; obtaining 7 to 9 hours of restorative sleep, at least 150 minutes of moderate intensity exercise weekly, the importance of healthy social connections,  and stress reduction techniques were discussed. C.  A full color page of  Calorie density of various food groups per pound showing examples of each food groups was provided to the patient.  - Nutritional counseling repeated at each appointment due to patients tendency to fall back in to old habits.  - The patient admits there is a room for improvement in their diet and drink choices. -  Suggestion is made for the patient  to avoid simple carbohydrates from their diet including Cakes, Sweet Desserts / Pastries, Ice Cream, Soda (diet and regular), Sweet Tea, Candies, Chips, Cookies, Sweet Pastries, Store Bought Juices, Alcohol in Excess of 1-2 drinks a day, Artificial Sweeteners, Coffee Creamer, and Sugar-free Products. This will help patient to have stable blood glucose profile and potentially avoid unintended weight gain.   - I encouraged the patient to switch to unprocessed or minimally processed complex starch and increased protein intake (animal or plant source), fruits, and vegetables.   - Patient is advised to stick to a routine mealtimes to eat 3 meals a day and avoid unnecessary snacks (to snack only to correct hypoglycemia).  - I have approached him with the following individualized plan to manage his diabetes and patient agrees:   - He is advised to continue Metformin  1000 mg ER daily with breakfast.  Will change from Ozempic  to Mounjaro  as his Ozempic  has lost effectiveness.  He will not need to start at lowest dose due to him being on highest dose of Ozempic .  Will start him on Mounjaro  7.5 mg SQ weekly.    -he is encouraged to continue monitoring blood glucose daily, before breakfast, and to call the clinic if he has readings less than 70 or above 300 for 3 tests in a row.  He is on safe regimen and doesn't need to monitor glucose every day but prefers to continue to prevent losing control.  - Adjustment parameters  are given to him for hypo and hyperglycemia in writing.  - Specific targets for  A1c; LDL, HDL, and Triglycerides were discussed with the patient.  2) Blood Pressure /Hypertension:  his blood pressure is controlled to target.   he is advised to continue his current medications as prescribed by PCP and cardiology.  3) Lipids/Hyperlipidemia:    Review of his recent lipid panel from 04/22/23 showed controlled LDL at 30 .  he is advised to continue Crestor  40 mg daily at bedtime and Repatha  per  cardiology.  Side effects and precautions discussed with him.  4)  Weight/Diet:  his Body mass index is 40.25 kg/m.  -  clearly complicating his diabetes care.   he is a candidate for weight loss. I discussed with him the fact that loss of 5 - 10% of his  current body weight will have the most impact on his diabetes management.  Exercise, and detailed carbohydrates information provided  -  detailed on discharge instructions.  5) Chronic Care/Health Maintenance: -he is not on ACEI/ARB and is on Statin medications and is encouraged to initiate and continue to follow up with Ophthalmology, Dentist, Podiatrist at least yearly or according to recommendations, and advised to stay away from smoking. I have recommended yearly flu vaccine and pneumonia vaccine at least every 5 years; moderate intensity exercise for up to 150 minutes weekly; and sleep for at least 7 hours a day.  - he is advised to maintain close follow up with Cook, Jayce G, DO for primary care needs, as well as his other providers for optimal and coordinated care.     I spent  36  minutes in the care of the patient today including review of labs from CMP, Lipids, Thyroid  Function, Hematology (current and previous including abstractions from other facilities); face-to-face time discussing  his blood glucose readings/logs, discussing hypoglycemia and hyperglycemia episodes and symptoms, medications doses, his options of short and long term treatment based on the latest standards of care / guidelines;  discussion about incorporating lifestyle medicine;  and documenting the encounter. Risk reduction counseling performed per USPSTF guidelines to reduce obesity and cardiovascular risk factors.     Please refer to Patient Instructions for Blood Glucose Monitoring and Insulin/Medications Dosing Guide  in media tab for additional information. Please  also refer to  Patient Self Inventory in the Media  tab for reviewed elements of pertinent patient  history.  [name] participated in the discussions, expressed understanding, and voiced agreement with the above plans.  All questions were answered to his satisfaction. he is encouraged to contact clinic should he have any questions or concerns prior to his return visit.     Follow up plan: - Return in about 4 months (around 04/25/2024) for Diabetes F/U with A1c in office, No previsit labs.   [name], [name] [address] [place], [place] [address] Phone: [phone] Fax: [phone]  12/24/2023, 8:52 AM

## 2024-01-29 ENCOUNTER — Ambulatory Visit (INDEPENDENT_AMBULATORY_CARE_PROVIDER_SITE_OTHER): Payer: Medicare Other

## 2024-01-29 VITALS — Ht 67.0 in | Wt 257.0 lb

## 2024-01-29 DIAGNOSIS — Z0000 Encounter for general adult medical examination without abnormal findings: Secondary | ICD-10-CM | POA: Diagnosis not present

## 2024-01-29 NOTE — Patient Instructions (Signed)
 [name],  Thank you for taking the time for your Medicare Wellness Visit. I appreciate your continued commitment to your health goals. Please review the care plan we discussed, and feel free to reach out if I can assist you further.  Please note that Annual Wellness Visits do not include a physical exam. Some assessments may be limited, especially if the visit was conducted virtually. If needed, we may recommend an in-person follow-up with your provider.  Ongoing Care Seeing your primary care provider every 3 to 6 months helps us  monitor your health and provide consistent, personalized care.   Referrals If a referral was made during today's visit and you haven't received any updates within two weeks, please contact the referred provider directly to check on the status.  Recommended Screenings:  Health Maintenance  Topic Date Due   Flu Shot  10/23/2023   COVID-19 Vaccine (4 - 2025-26 season) 11/23/2023   DTaP/Tdap/Td vaccine (2 - Td or Tdap) 03/24/2024   Yearly kidney function blood test for diabetes  04/21/2024   Yearly kidney health urinalysis for diabetes  04/21/2024   Complete foot exam   04/21/2024   Eye exam for diabetics  05/18/2024   Hemoglobin A1C  06/23/2024   Colon Cancer Screening  09/17/2024   Medicare Annual Wellness Visit  01/28/2025   Pneumococcal Vaccine for age over 63  Completed   Zoster (Shingles) Vaccine  Completed   Meningitis B Vaccine  Aged Out   Hepatitis C Screening  Discontinued       01/28/2024    8:23 AM  Advanced Directives  Does Patient Have a Medical Advance Directive? No  Does patient want to make changes to medical advance directive? Yes (MAU/Ambulatory/Procedural Areas - Information given)   Information on Advanced Care Planning can be found at   Secretary of [facility] Advance Health Care Directives Advance Health Care Directives ([email or web address]    Vision: Annual vision screenings are recommended for early detection of glaucoma,  cataracts, and diabetic retinopathy. These exams can also reveal signs of chronic conditions such as diabetes and high blood pressure.  Dental: Annual dental screenings help detect early signs of oral cancer, gum disease, and other conditions linked to overall health, including heart disease and diabetes.  Please see the attached documents for additional preventive care recommendations.

## 2024-01-29 NOTE — Progress Notes (Signed)
 Subjective:   [name] is a 68 y.o. male who presents for a Medicare Annual Wellness Visit.  I connected with  Renelda LITTIE Music on 01/29/24 by a audio enabled telemedicine application and verified that I am speaking with the correct person using two identifiers.  Patient Location: Home  Provider Location: Office/Clinic  Persons Participating in Visit: Patient.  I discussed the limitations of evaluation and management by telemedicine. The patient expressed understanding and agreed to proceed.  Vital Signs: Because this visit was a virtual/telehealth visit, some criteria may be missing or patient reported. Any vitals not documented were not able to be obtained and vitals that have been documented are patient reported.  Allergies (verified) Hydrocodone    History: Past Medical History:  Diagnosis Date   Arthritis    CAD (coronary artery disease) 2000   a. History of MI ~2000 s/p PCI. b. CABG in 2006 (LIMA-LAD, RIMA-acute marginal, SVG-DX, SVG-RCA). c. NSTEMI 05/2013:severe stenoses of the SVG-PDA and SVG-diagonal, both treated successfully with PCI (DES x2).   Diabetes (HCC)    GERD (gastroesophageal reflux disease)    Gout    History of hypertension    Hydrocele 2012   Hyperlipidemia    Hypertension    Myocardial infarction [facility]) 2014   x3   Obesity    S/P coronary angioplasty 11/10/17 11/11/2017   Past Surgical History:  Procedure Laterality Date   BIOPSY  09/17/2021   Procedure: BIOPSY;  Surgeon: Eartha Angelia Sieving, MD;  [facility]: [facility];  Service: Gastroenterology;;   CATARACT EXTRACTION W/PHACO Left 05/17/2012   Procedure: CATARACT EXTRACTION PHACO AND INTRAOCULAR LENS PLACEMENT (IOC);  Surgeon: Cherene Mania, MD;  Location: [facility] OR[facility];  Service: Ophthalmology;  Laterality: Left;  CDE:  0.76   CATARACT EXTRACTION W/PHACO Right 08/26/2012   Procedure: CATARACT EXTRACTION PHACO AND INTRAOCULAR LENS PLACEMENT (IOC);  Surgeon: Cherene Mania, MD;  Location: [facility];   Service: Ophthalmology;  Laterality: Right;  CDE: 1.13   COLONOSCOPY WITH PROPOFOL  N/A 09/17/2021   Procedure: COLONOSCOPY WITH PROPOFOL ;  Surgeon: Eartha Angelia Sieving, MD;  [facility];  Service: Gastroenterology;  Laterality: N/A;  730   CORONARY ANGIOPLASTY  2000   stents x2   CORONARY ARTERY BYPASS GRAFT  2006   4 vessels   Coronary artery bypass grafting  May 2006   Four-vessel   CORONARY BALLOON ANGIOPLASTY N/A 11/10/2017   Procedure: CORONARY BALLOON ANGIOPLASTY;  Surgeon: Wonda Sharper, MD;  Location: [facility] INVASIVE CV LAB;  Service: Cardiovascular;  Laterality: N/A;   FOOT FRACTURE SURGERY     right   HEMOSTASIS CLIP PLACEMENT  09/17/2021   Procedure: HEMOSTASIS CLIP PLACEMENT;  Surgeon: Eartha Angelia Sieving, MD;  [facility];  Service: Gastroenterology;;   LEFT HEART CATH AND CORS/GRAFTS ANGIOGRAPHY N/A 11/10/2017   Procedure: LEFT HEART CATH AND CORS/GRAFTS ANGIOGRAPHY;  Surgeon: Wonda Sharper, MD;  Location: [facility] INVASIVE CV LAB;  Service: Cardiovascular;  Laterality: N/A;   LEFT HEART CATHETERIZATION WITH CORONARY/GRAFT ANGIOGRAM N/A 05/05/2011   Procedure: LEFT HEART CATHETERIZATION WITH EL BILE;  Surgeon: Maude JAYSON Emmer, MD;  Location: [facility] CATH LAB;  Service: Cardiovascular;  Laterality: N/A;   LEFT HEART CATHETERIZATION WITH CORONARY/GRAFT ANGIOGRAM N/A 06/06/2013   Procedure: LEFT HEART CATHETERIZATION WITH EL BILE;  Surgeon: Sharper JONETTA Wonda, MD;  Location: [facility] CATH LAB;  Service: Cardiovascular;  Laterality: N/A;   MASS EXCISION Right 05/14/2016   Procedure: EXCISION MASS RIGHT ELBOW;  Surgeon: Taft FORBES Minerva, MD;  Location: [facility];  Service: Orthopedics;  Laterality: Right;   PILONIDAL CYST EXCISION     POLYPECTOMY  09/17/2021   Procedure: POLYPECTOMY;  Surgeon: Eartha Flavors, Toribio, MD;  Location: [facility];  Service: Gastroenterology;;   TONSILLECTOMY     Family History  Problem Relation Age of  Onset   Cancer Mother    Cervical cancer Mother    Cancer Father    Throat cancer Father    Thyroid  disease Son    Healthy Daughter    Healthy Son    Social History   Occupational History   Not on file  Tobacco Use   Smoking status: Never   Smokeless tobacco: Never  Vaping Use   Vaping status: Never Used  Substance and Sexual Activity   Alcohol use: No   Drug use: No   Sexual activity: Yes    Birth control/protection: None   Tobacco Counseling Counseling given: Not Answered  SDOH Screenings   Food Insecurity: No Food Insecurity (01/28/2024)  Housing: Low Risk  (01/28/2024)  Transportation Needs: No Transportation Needs (01/28/2024)  Utilities: Not At Risk (01/29/2024)  Alcohol Screen: Low Risk  (01/28/2024)  Depression (PHQ2-9): Low Risk  (01/29/2024)  Financial Resource Strain: Low Risk  (01/28/2024)  Physical Activity: Insufficiently Active (01/28/2024)  Social Connections: Moderately Integrated (01/28/2024)  Stress: No Stress Concern Present (01/28/2024)  Tobacco Use: Low Risk  (01/29/2024)  Health Literacy: Adequate Health Literacy (01/29/2024)   Depression Screen    01/29/2024    8:21 AM 10/20/2023    9:18 AM 04/22/2023   11:10 AM 01/23/2023    1:06 PM 03/21/2022    8:39 AM 01/08/2022    1:09 PM 12/20/2021    8:35 AM  PHQ 2/9 Scores  PHQ - 2 Score 0 0 0 0 0 0 0  PHQ- 9 Score  3  0   0  0       Data saved with a previous flowsheet row definition     Goals Addressed             This Visit's Progress    Maintain health and independence   On track      Visit info / Clinical Intake: Medicare Wellness Visit Type:: Subsequent Annual Wellness Visit Medicare Wellness Visit Mode:: Telephone If telephone:: video declined If telephone or video:: vitals recorded from last visit Interpreter Needed?: No Pre-visit prep was completed: yes AWV questionnaire completed by patient prior to visit?: yes Date:: 01/28/24 Living arrangements:: (!) lives alone Patient's  Overall Health Status Rating: good Typical amount of pain: some Does pain affect daily life?: no Are you currently prescribed opioids?: no  Dietary Habits and Nutritional Risks How many meals a day?: 3 Eats fruit and vegetables daily?: yes Most meals are obtained by: eating out Diabetic:: (!) yes Any non-healing wounds?: no How often do you check your BS?: 2 Would you like to be referred to a Nutritionist or for Diabetic Management? : no  Functional Status Activities of Daily Living (to include ambulation/medication): (Patient-Rptd) Independent Ambulation: (Patient-Rptd) Independent Medication Administration: Independent Home Management: (Patient-Rptd) Independent Manage your own finances?: yes Primary transportation is: driving Concerns about vision?: no *vision screening is required for WTM* Concerns about hearing?: no  Fall Screening Falls in the past year?: (Patient-Rptd) 0 Number of falls in past year: (Patient-Rptd) 0 Was there an injury with Fall?: (Patient-Rptd) 0 Fall Risk Category Calculator: (Patient-Rptd) 0 Patient Fall Risk Level: (Patient-Rptd) Low Fall Risk  Fall Risk Patient at Risk for Falls  Due to: No Fall Risks Fall risk Follow up: Falls evaluation completed; Education provided; Falls prevention discussed  Home and Transportation Safety: All rugs have non-skid backing?: yes All stairs or steps have railings?: yes Grab bars in the bathtub or shower?: yes Have non-skid surface in bathtub or shower?: yes Good home lighting?: yes Regular seat belt use?: yes Hospital stays in the last year:: no  Cognitive Assessment Difficulty concentrating, remembering, or making decisions? : no Will 6CIT or Mini Cog be Completed: no 6CIT or Mini Cog Declined: patient alert, oriented, able to answer questions appropriately and recall recent events  Advance Directives (For Healthcare) Does Patient Have a Medical Advance Directive?: No Does patient want to make changes  to medical advance directive?: Yes (MAU/Ambulatory/Procedural Areas - Information given)  Reviewed/Updated  Reviewed/Updated: All        Objective:    Today's Vitals   01/29/24 0817  Weight: 257 lb (116.6 kg)  Height: 5' 7 (1.702 m)   Body mass index is 40.25 kg/m.  Current Medications (verified) Outpatient Encounter Medications as of 01/29/2024  Medication Sig   Accu-Chek Softclix Lancets lancets Use as instructed to monitor glucose once daily   aspirin  EC 81 MG tablet Take 81 mg by mouth daily.   blood glucose meter kit and supplies KIT 1 each by Does not apply route daily. Dispense based on patient and insurance preference. Use to check blood glucose daily as directed.   clopidogrel  (PLAVIX ) 75 MG tablet Take 1 tablet (75 mg total) by mouth daily.   Evolocumab  (REPATHA  SURECLICK) 140 MG/ML SOAJ ADMINISTER 1 ML UNDER THE SKIN EVERY 14 DAYS   glucose blood (ACCU-CHEK GUIDE TEST) test strip Use as instructed to monitor glucose twice daily   isosorbide  mononitrate (IMDUR ) 60 MG 24 hr tablet Take 1 tablet (60 mg total) by mouth daily.   losartan  (COZAAR ) 25 MG tablet TAKE 1 TABLET (25 MG TOTAL) BY MOUTH DAILY.   metFORMIN  (GLUCOPHAGE -XR) 500 MG 24 hr tablet Take 2 tablets (1,000 mg total) by mouth daily with breakfast.   metoprolol  tartrate (LOPRESSOR ) 25 MG tablet TAKE 1 TABLET BY MOUTH TWICE A DAY (Patient taking differently: Take 25 mg by mouth 2 (two) times daily. TAKE 1 TABLET BY MOUTH TWICE A DAY)   nitroGLYCERIN  (NITROSTAT ) 0.4 MG SL tablet Place 1 tablet (0.4 mg total) under the tongue every 5 (five) minutes as needed for chest pain.   rosuvastatin  (CRESTOR ) 40 MG tablet Take 1 tablet (40 mg total) by mouth daily.   tirzepatide  (MOUNJARO ) 7.5 MG/0.5ML Pen Inject 7.5 mg into the skin once a week.   No facility-administered encounter medications on file as of 01/29/2024.   Hearing/Vision screen Hearing Screening - Comments:: Patient is able to hear conversational tones  without difficulty. No issues reported.   Vision Screening - Comments:: Wears rx glasses - up to date with routine eye exams with [facility] [place]  Immunizations and Health Maintenance Health Maintenance  Topic Date Due   Influenza Vaccine  10/23/2023   COVID-19 Vaccine (4 - 2025-26 season) 11/23/2023   DTaP/Tdap/Td (2 - Td or Tdap) 03/24/2024   Diabetic kidney evaluation - eGFR measurement  04/21/2024   Diabetic kidney evaluation - Urine ACR  04/21/2024   FOOT EXAM  04/21/2024   OPHTHALMOLOGY EXAM  05/18/2024   HEMOGLOBIN A1C  06/23/2024   Colonoscopy  09/17/2024   Medicare Annual Wellness (AWV)  01/28/2025   Pneumococcal Vaccine: 50+ Years  Completed   Zoster Vaccines- Shingrix  Completed  Meningococcal B Vaccine  Aged Out   Hepatitis C Screening  Discontinued        Assessment/Plan:  This is a routine wellness examination for Herrick.  Patient Care Team: Cook, Jayce G, DO as PCP - General (Family Medicine) Wonda Sharper, MD as PCP - Cardiology (Cardiology) Therisa Benton PARAS, NP as Nurse Practitioner (Nurse Practitioner) Darroll Anes, DO (Optometry)  I have personally reviewed and noted the following in the patient's chart:   Medical and social history Use of alcohol, tobacco or illicit drugs  Current medications and supplements including opioid prescriptions. Functional ability and status Nutritional status Physical activity Advanced directives List of other physicians Hospitalizations, surgeries, and ER visits in previous 12 months Vitals Screenings to include cognitive, depression, and falls Referrals and appointments  No orders of the defined types were placed in this encounter.  In addition, I have reviewed and discussed with patient certain preventive protocols, quality metrics, and best practice recommendations. A written personalized care plan for preventive services as well as general preventive health recommendations were provided to  patient.   Lavelle Charmaine Browner, LPN   [id number]   Return in 1 year (on 01/28/2025).  After Visit Summary: (MyChart) Due to this being a telephonic visit, the after visit summary with patients personalized plan was offered to patient via MyChart   Nurse Notes: No concerns

## 2024-02-14 ENCOUNTER — Other Ambulatory Visit: Payer: Self-pay | Admitting: Family Medicine

## 2024-02-16 ENCOUNTER — Telehealth: Payer: Self-pay | Admitting: Pharmacy Technician

## 2024-02-16 ENCOUNTER — Other Ambulatory Visit (HOSPITAL_COMMUNITY): Payer: Self-pay

## 2024-02-16 NOTE — Telephone Encounter (Signed)
 Pharmacy Patient Advocate Encounter  Received notification from [facility] that Prior Authorization for Repatha  SureClick 140MG /ML auto-injectors has been APPROVED from 02/16/2024 to 03/23/2025. Ran test claim, Copay is $0.00. This test claim was processed through [facility]- copay amounts may vary at other pharmacies due to pharmacy/plan contracts, or as the patient moves through the different stages of their insurance plan.   PA #/Case ID/Reference #: [id number]

## 2024-02-16 NOTE — Telephone Encounter (Signed)
 Pharmacy Patient Advocate Encounter   Received notification from CoverMyMeds that prior authorization for Repatha  SureClick 140MG /ML auto-injectors is required/requested.   Insurance verification completed.   The patient is insured through [employer].   Per test claim: PA required; PA submitted to above mentioned insurance via Latent Key/confirmation #/EOC [id number] Status is pending

## 2024-04-21 ENCOUNTER — Encounter: Payer: Self-pay | Admitting: Family Medicine

## 2024-04-21 ENCOUNTER — Ambulatory Visit (INDEPENDENT_AMBULATORY_CARE_PROVIDER_SITE_OTHER): Admitting: Family Medicine

## 2024-04-21 VITALS — BP 126/83 | HR 69 | Temp 98.2°F | Ht 67.0 in | Wt 250.6 lb

## 2024-04-21 DIAGNOSIS — E1165 Type 2 diabetes mellitus with hyperglycemia: Secondary | ICD-10-CM

## 2024-04-21 DIAGNOSIS — Z7985 Long-term (current) use of injectable non-insulin antidiabetic drugs: Secondary | ICD-10-CM | POA: Diagnosis not present

## 2024-04-21 DIAGNOSIS — I25119 Atherosclerotic heart disease of native coronary artery with unspecified angina pectoris: Secondary | ICD-10-CM

## 2024-04-21 DIAGNOSIS — Z130 Encounter for screening for diseases of the blood and blood-forming organs and certain disorders involving the immune mechanism: Secondary | ICD-10-CM | POA: Diagnosis not present

## 2024-04-21 DIAGNOSIS — I10 Essential (primary) hypertension: Secondary | ICD-10-CM

## 2024-04-21 DIAGNOSIS — E66812 Obesity, class 2: Secondary | ICD-10-CM | POA: Diagnosis not present

## 2024-04-21 DIAGNOSIS — E782 Mixed hyperlipidemia: Secondary | ICD-10-CM | POA: Diagnosis not present

## 2024-04-21 NOTE — Progress Notes (Signed)
 "  Subjective:  Patient ID: [name], male    DOB: 10/15/1955  Age: 69 y.o. MRN: [id number]  CC:  Follow up   HPI:  69 year old male presents for follow-up.  Last A1c was 8.  Compliant with metformin  and Mounjaro .  He is having some nausea and vomiting associated with Mounjaro .  Last episode was about a month ago.  He is losing weight.  Denies chest pain or shortness of breath.  BP fairly well-controlled on losartan  and metoprolol .  Lipids have been at goal on Crestor  and path.  Planning for labs today.  Patient Active Problem List   Diagnosis Date Noted   Type 2 diabetes mellitus with hyperglycemia, without long-term current use of insulin (HCC) 04/21/2024   Morbid obesity (HCC) 10/20/2023   Mixed hyperlipidemia 06/20/2021   History of gout 06/20/2021   Coronary artery disease involving native coronary artery with angina pectoris    GERD (gastroesophageal reflux disease) 07/12/2012   Fatty liver 07/07/2011   Essential hypertension 08/22/2008    Social Hx   Social History   Socioeconomic History   Marital status: Divorced    Spouse name: Not on file   Number of children: Not on file   Years of education: Not on file   Highest education level: 12th grade  Occupational History   Not on file  Tobacco Use   Smoking status: Never   Smokeless tobacco: Never  Vaping Use   Vaping status: Never Used  Substance and Sexual Activity   Alcohol use: No   Drug use: No   Sexual activity: Yes    Birth control/protection: None  Other Topics Concern   Not on file  Social History Narrative   Not on file   Social Drivers of Health   Tobacco Use: Low Risk (04/21/2024)   Patient History    Smoking Tobacco Use: Never    Smokeless Tobacco Use: Never    Passive Exposure: Not on file  Financial Resource Strain: Low Risk (01/28/2024)   Overall Financial Resource Strain (CARDIA)    Difficulty of Paying Living Expenses: Not hard at all  Food Insecurity: No Food Insecurity  (01/28/2024)   Epic    Worried About Radiation Protection Practitioner of Food in the Last Year: Never true    Ran Out of Food in the Last Year: Never true  Transportation Needs: No Transportation Needs (01/28/2024)   Epic    Lack of Transportation (Medical): No    Lack of Transportation (Non-Medical): No  Physical Activity: Insufficiently Active (01/28/2024)   Exercise Vital Sign    Days of Exercise per Week: 2 days    Minutes of Exercise per Session: 30 min  Stress: No Stress Concern Present (01/28/2024)   [employer] of Occupational Health - Occupational Stress Questionnaire    Feeling of Stress: Only a little  Social Connections: Moderately Integrated (01/28/2024)   Social Connection and Isolation Panel    Frequency of Communication with Friends and Family: More than three times a week    Frequency of Social Gatherings with Friends and Family: Once a week    Attends Religious Services: More than 4 times per year    Active Member of Clubs or Organizations: Yes    Attends Banker Meetings: 1 to 4 times per year    Marital Status: Divorced  Depression (PHQ2-9): Low Risk (04/21/2024)   Depression (PHQ2-9)    PHQ-2 Score: 0  Alcohol Screen: Low Risk (01/28/2024)   Alcohol Screen    Last  Alcohol Screening Score (AUDIT): 0  Housing: Low Risk (01/28/2024)   Epic    Unable to Pay for Housing in the Last Year: No    Number of Times Moved in the Last Year: 0    Homeless in the Last Year: No  Utilities: Not At Risk (01/29/2024)   Epic    Threatened with loss of utilities: No  Health Literacy: Adequate Health Literacy (01/29/2024)   B1300 Health Literacy    Frequency of need for help with medical instructions: Never    Review of Systems Per HPI  Objective:  BP 126/83   Pulse 69   Temp 98.2 F (36.8 C)   Ht 5' 7 (1.702 m)   Wt 250 lb 9.6 oz (113.7 kg)   SpO2 98%   BMI 39.25 kg/m      04/21/2024    8:36 AM 01/29/2024    8:17 AM 12/24/2023    8:24 AM  BP/Weight  Systolic BP 126 --  118  Diastolic BP 83 -- 70  Wt. (Lbs) 250.6 257 257  BMI 39.25 kg/m2 40.25 kg/m2 40.25 kg/m2    Physical Exam Vitals and nursing note reviewed.  Constitutional:      General: He is not in acute distress.    Appearance: Normal appearance. He is obese.  HENT:     Head: Normocephalic and atraumatic.  Eyes:     General:        Right eye: No discharge.        Left eye: No discharge.     Conjunctiva/sclera: Conjunctivae normal.  Cardiovascular:     Rate and Rhythm: Normal rate and regular rhythm.  Pulmonary:     Effort: Pulmonary effort is normal.     Breath sounds: Normal breath sounds. No wheezing, rhonchi or rales.  Neurological:     Mental Status: He is alert.  Psychiatric:        Mood and Affect: Mood normal.        Behavior: Behavior normal.     Lab Results  Component Value Date   WBC 8.0 04/22/2023   HGB 13.9 04/22/2023   HCT 40.9 04/22/2023   PLT 243 04/22/2023   GLUCOSE 121 (H) 04/22/2023   CHOL 98 (L) 04/22/2023   TRIG 128 04/22/2023   HDL 45 04/22/2023   LDLCALC 30 04/22/2023   ALT 24 04/22/2023   AST 26 04/22/2023   NA 138 04/22/2023   K 5.0 04/22/2023   CL 99 04/22/2023   CREATININE 0.95 04/22/2023   BUN 12 04/22/2023   CO2 27 04/22/2023   TSH 2.830 08/14/2022   INR 1.05 06/06/2013   HGBA1C 8.0 (A) 12/24/2023   MICROALBUR 80 mg/L 05/21/2022     Assessment & Plan:  Type 2 diabetes mellitus with hyperglycemia, without long-term current use of insulin (HCC) Assessment & Plan: Has not been at goal.  I am hopeful that Mounjaro  will get him to goal.  He is willing to increase if needed.  A1c today.  Continue metformin .  Will increase Mounjaro  to 10 mg if A1c is not at goal.  Orders: -     CMP14+EGFR -     Hemoglobin A1c -     Microalbumin / creatinine urine ratio  Morbid obesity (HCC) Assessment & Plan: On Mounjaro .  Has lost some weight.   Mixed hyperlipidemia Assessment & Plan: Lipid panel today to assess.  Has been at goal.  Continue  Crestor  and Repatha .  Orders: -     Lipid panel  Screening for deficiency anemia -     CBC  Coronary artery disease involving native coronary artery of native heart with angina pectoris Assessment & Plan: Stable currently.  Denies chest pain or shortness of breath.  Continue current medications.   Essential hypertension Assessment & Plan: Medications.  Medications reconciled today.     Follow-up: 6 months  Leanora Murin Bluford DO [facility] "

## 2024-04-21 NOTE — Patient Instructions (Signed)
Labs today.  Follow up in 6 months.  Take care  Dr. Alyxis Grippi  

## 2024-04-21 NOTE — Assessment & Plan Note (Signed)
 On Mounjaro .  Has lost some weight.

## 2024-04-21 NOTE — Assessment & Plan Note (Signed)
 Medications.  Medications reconciled today.

## 2024-04-21 NOTE — Assessment & Plan Note (Signed)
 Lipid panel today to assess.  Has been at goal.  Continue Crestor  and Repatha .

## 2024-04-21 NOTE — Assessment & Plan Note (Signed)
 Has not been at goal.  I am hopeful that Mounjaro  will get him to goal.  He is willing to increase if needed.  A1c today.  Continue metformin .  Will increase Mounjaro  to 10 mg if A1c is not at goal.

## 2024-04-21 NOTE — Assessment & Plan Note (Signed)
 Stable currently.  Denies chest pain or shortness of breath.  Continue current medications.

## 2024-04-22 LAB — CMP14+EGFR
ALT: 28 [IU]/L (ref 0–44)
AST: 26 [IU]/L (ref 0–40)
Albumin: 4.8 g/dL (ref 3.9–4.9)
Alkaline Phosphatase: 104 [IU]/L (ref 47–123)
BUN/Creatinine Ratio: 10 (ref 10–24)
BUN: 11 mg/dL (ref 8–27)
Bilirubin Total: 1.3 mg/dL — ABNORMAL HIGH (ref 0.0–1.2)
CO2: 23 mmol/L (ref 20–29)
Calcium: 9.6 mg/dL (ref 8.6–10.2)
Chloride: 101 mmol/L (ref 96–106)
Creatinine, Ser: 1.1 mg/dL (ref 0.76–1.27)
Globulin, Total: 2 g/dL (ref 1.5–4.5)
Glucose: 157 mg/dL — ABNORMAL HIGH (ref 70–99)
Potassium: 5.1 mmol/L (ref 3.5–5.2)
Sodium: 140 mmol/L (ref 134–144)
Total Protein: 6.8 g/dL (ref 6.0–8.5)
eGFR: 73 mL/min/{1.73_m2}

## 2024-04-22 LAB — CBC
Hematocrit: 42.6 % (ref 37.5–51.0)
Hemoglobin: 14.1 g/dL (ref 13.0–17.7)
MCH: 30.5 pg (ref 26.6–33.0)
MCHC: 33.1 g/dL (ref 31.5–35.7)
MCV: 92 fL (ref 79–97)
Platelets: 252 10*3/uL (ref 150–450)
RBC: 4.63 x10E6/uL (ref 4.14–5.80)
RDW: 13.1 % (ref 11.6–15.4)
WBC: 8.1 10*3/uL (ref 3.4–10.8)

## 2024-04-22 LAB — HEMOGLOBIN A1C
Est. average glucose Bld gHb Est-mCnc: 151 mg/dL
Hgb A1c MFr Bld: 6.9 % — ABNORMAL HIGH (ref 4.8–5.6)

## 2024-04-22 LAB — MICROALBUMIN / CREATININE URINE RATIO
Creatinine, Urine: 168.7 mg/dL
Microalb/Creat Ratio: 204 mg/g{creat} — ABNORMAL HIGH (ref 0–29)
Microalbumin, Urine: 343.6 ug/mL

## 2024-04-22 LAB — LIPID PANEL
Chol/HDL Ratio: 2.2 ratio (ref 0.0–5.0)
Cholesterol, Total: 91 mg/dL — ABNORMAL LOW (ref 100–199)
HDL: 41 mg/dL
LDL Chol Calc (NIH): 26 mg/dL (ref 0–99)
Triglycerides: 137 mg/dL (ref 0–149)
VLDL Cholesterol Cal: 24 mg/dL (ref 5–40)

## 2024-04-23 ENCOUNTER — Ambulatory Visit: Payer: Self-pay | Admitting: Family Medicine

## 2024-04-26 ENCOUNTER — Ambulatory Visit: Admitting: Nurse Practitioner

## 2024-04-26 DIAGNOSIS — Z7984 Long term (current) use of oral hypoglycemic drugs: Secondary | ICD-10-CM

## 2024-04-26 DIAGNOSIS — E1165 Type 2 diabetes mellitus with hyperglycemia: Secondary | ICD-10-CM

## 2024-04-26 DIAGNOSIS — E559 Vitamin D deficiency, unspecified: Secondary | ICD-10-CM

## 2024-04-26 DIAGNOSIS — I10 Essential (primary) hypertension: Secondary | ICD-10-CM

## 2024-04-26 DIAGNOSIS — E782 Mixed hyperlipidemia: Secondary | ICD-10-CM

## 2024-04-26 DIAGNOSIS — Z7985 Long-term (current) use of injectable non-insulin antidiabetic drugs: Secondary | ICD-10-CM

## 2024-05-27 ENCOUNTER — Ambulatory Visit: Admitting: Nurse Practitioner

## 2024-10-18 ENCOUNTER — Ambulatory Visit: Admitting: Family Medicine

## 2025-02-10 ENCOUNTER — Ambulatory Visit
